# Patient Record
Sex: Male | Born: 1955 | Race: White | Hispanic: No | Marital: Married
Health system: Midwestern US, Community
[De-identification: ages and names within clinical notes are randomized; demographics above are authoritative.]

## PROBLEM LIST (undated history)

## (undated) DIAGNOSIS — M2540 Effusion, unspecified joint: Secondary | ICD-10-CM

## (undated) DIAGNOSIS — R519 Headache, unspecified: Secondary | ICD-10-CM

## (undated) DIAGNOSIS — L570 Actinic keratosis: Secondary | ICD-10-CM

## (undated) DIAGNOSIS — M1990 Unspecified osteoarthritis, unspecified site: Secondary | ICD-10-CM

## (undated) DIAGNOSIS — I809 Phlebitis and thrombophlebitis of unspecified site: Secondary | ICD-10-CM

## (undated) DIAGNOSIS — H3530 Unspecified macular degeneration: Secondary | ICD-10-CM

## (undated) DIAGNOSIS — R51 Headache: Secondary | ICD-10-CM

## (undated) DIAGNOSIS — M2550 Pain in unspecified joint: Secondary | ICD-10-CM

## (undated) DIAGNOSIS — I420 Dilated cardiomyopathy: Secondary | ICD-10-CM

## (undated) DIAGNOSIS — I4891 Unspecified atrial fibrillation: Secondary | ICD-10-CM

## (undated) DIAGNOSIS — I2584 Coronary atherosclerosis due to calcified coronary lesion: Secondary | ICD-10-CM

## (undated) DIAGNOSIS — I481 Persistent atrial fibrillation: Secondary | ICD-10-CM

## (undated) DIAGNOSIS — I2510 Atherosclerotic heart disease of native coronary artery without angina pectoris: Secondary | ICD-10-CM

## (undated) DIAGNOSIS — I4819 Other persistent atrial fibrillation: Secondary | ICD-10-CM

## (undated) DIAGNOSIS — I513 Intracardiac thrombosis, not elsewhere classified: Secondary | ICD-10-CM

## (undated) DIAGNOSIS — I5032 Chronic diastolic (congestive) heart failure: Principal | ICD-10-CM

## (undated) DIAGNOSIS — Z95810 Presence of automatic (implantable) cardiac defibrillator: Secondary | ICD-10-CM

## (undated) DIAGNOSIS — I5020 Unspecified systolic (congestive) heart failure: Secondary | ICD-10-CM

## (undated) HISTORY — DX: Actinic keratosis: L57.0

## (undated) HISTORY — PX: KNEE ARTHROSCOPY: SUR90

---

## 1998-04-17 HISTORY — PX: HERNIA REPAIR: SHX51

## 2001-04-17 HISTORY — PX: VARICOSE VEIN SURGERY: SHX832

## 2003-04-18 HISTORY — PX: JOINT REPLACEMENT: SHX530

## 2008-06-23 DIAGNOSIS — D239 Other benign neoplasm of skin, unspecified: Secondary | ICD-10-CM

## 2008-06-23 HISTORY — DX: Other benign neoplasm of skin, unspecified: D23.9

## 2008-07-06 ENCOUNTER — Inpatient Hospital Stay (HOSPITAL_COMMUNITY): Admission: RE | Admit: 2008-07-06 | Discharge: 2008-07-08 | Payer: Self-pay | Admitting: Orthopedic Surgery

## 2008-07-27 ENCOUNTER — Encounter: Payer: Self-pay | Admitting: Orthopedic Surgery

## 2008-08-15 ENCOUNTER — Encounter: Payer: Self-pay | Admitting: Orthopedic Surgery

## 2008-09-15 ENCOUNTER — Encounter: Payer: Self-pay | Admitting: Orthopedic Surgery

## 2009-12-06 DIAGNOSIS — I2109 ST elevation (STEMI) myocardial infarction involving other coronary artery of anterior wall: Secondary | ICD-10-CM

## 2009-12-06 NOTE — Procedures (Signed)
Procedures signed by  at 12/07/09  0200                 Author: Servando Snare  Service: --  Author Type: Physician       Filed: 12/07/09 0200  Date of Service: 12/06/09 2356  Status: Signed          Editor: Servando Snare            Procedure Orders        1. TRANSCRIBED CATH LAB DOCUMENTS [[id number]] ordered by  at 12/06/09 2356                         <!--EPICS-->                           [facility]<BR>                           [address]<BR>                          [place] [address]<BR>                               [phone]<BR> <BR>            [facility] - CATH LAB REPORT<BR> <BR> NAME:  [name] [name], [name] [name]                                MR:  [id number]<UU> LOC:  CC  [id number]             SEX:  M                ACCT:  [id number] DOB:  Feb 13, 1956            AGE:  54              PT:  I<BR> ADMIT:  12/06/2009          DSCH:                 MSV:  MED<BR> <BR> <BR> HISTORY:  The patient is a 54 year old gentleman who began having severe<BR> chest pain approximately  60 to 90 minutes prior to arrival in the<BR> emergency department, where his electrocardiograms demonstrated changes<BR> consistent with an acute anteroseptal myocardial infarction. He was<BR> brought to the cardiac catheterization laboratory emergently  for cardiac<BR> catheterization and PCI if possible.<BR> <BR> PROCEDURE:  Following informed consent, a left heart catheterization and<BR> coronary angiography were performed from the right groin without<BR> difficulty. A 6-French JR4 Judkins catheter  was used along with a<BR> 7-French 3.5 XB guiding catheter with fair engagement of the left main<BR> coronary artery ostium. With some difficulty, the LAD was wired and then<BR> pre-dilated using a 2.0 mm balloon. Manual aspiration was performed using<BR>  a Fetch catheter, with aspiration of a sizable amount of thrombus. A<BR> guide wire was also placed into the diagonal branch of the LAD just  prior<BR> to the totally occluded LAD. The LAD was then stented using a 3.0 x 28 mm<BR> Xience V drug-eluting  stent, which jailed the diagonal branch of the LAD.<BR> The proximal portion of the LAD just proximal to the bifurcation was<BR>  aneurysmal and ectatic and by intravascular ultrasound appeared to be 7<BR> mm in diameter or greater. The distal portion  of the stent was post<BR> dilated with a 3.25 mm balloon, and the most proximal segment was post<BR> dilated with a 4.0 x 8 mm noncompliant balloon at high pressures. He had<BR> moderate chest pain until the stents were deployed, and then his pain<BR>  subsided. Multiple doses of intracoronary nitroglycerin were utilized<BR> along with a bolus of intracoronary ReoPro directly into the left main<BR> coronary artery ostium. An intravascular ultrasound was performed,<BR> assessing the adequacy of the stent.  There was approximately 1-2 mm of<BR> the proximal stent edge which was in the aneurysmal segment of the<BR> proximal LAD, but it was felt that there was such a drastic change in<BR> diameter that it was best to leave this rather than to continue with<BR>  larger balloon sizes and risk perforation of the LAD. The procedure was<BR> then terminated. Intravenous Angiomax was used as a bolus and continuous<BR> infusion.<BR> <BR> X-RAY CONTRAST:  Optiray 233 ml.<BR> <BR> PRESSURE DATA:  Aorta 160/100, left ventricle  160/33.<BR> <BR> LEFT VENTRICULOGRAM:  The left ventricle appeared to be grossly normal in<BR> size with no mitral regurgitation noted. There was moderate enlargement<BR> of the ascending aorta. The anterior wall seemed to contract normally.<BR> There  was a small amount of akinesis in the inferoapical segment.<BR> <BR> CORONARY ANGIOGRAPHY<BR> 1. 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 Left anterior descending artery:  The LAD was totally occluded<BR> proximally after  a moderate sized first septal perforator, and after a<BR> moderate-sized diagonal branch which bifurcated in its proximal to mid<BR> segment. The diagonal had diffuse irregularity approaching 30% to 40%<BR>  narrowing of the diameter, but no high-grade focal stenosis was detected.<BR> The LAD was totally occluded after the bifurcation of the diagonal and<BR> 1st septal perforator, with very faint filling of the most apical portion<BR> of the LAD by collateral  circulation late in the injection phase.<BR> 3. Circumflex artery: The circumflex was severely diseased. There was a<BR> 70% narrowing of the diameter in the mid segment before the origin of a<BR> small to moderate size obtuse marginal branch. There was  a 70 to 80%<BR> narrowing of the diameter at the ostium of the obtuse marginal branch.<BR> The true circumflex just after the bifurcation of the marginal was<BR> totally occluded. The distal vessel was seen to fill late in the<BR> injection phase by collateral  circulation and was felt to be small and<BR> with a short distribution.<BR> 4. Right coronary artery: The right coronary artery was a large ectatic<BR> vessel with diffuse disease throughout its proximal mid and distal<BR> segment. There was a 70% narrowing  of the diameter noted in the distal<BR> vessel just before the origin of a small to moderate size posterior<BR> descending coronary artery. The posterior descending was diffusely<BR> irregular and diseased but was somewhat small. There was a large<BR>  posterolateral left ventricular branch which had a 70% narrowing of the<BR> diameter in its proximal segment. The distal portion of the<BR> posterolateral branch appeared free of significant stenosis. A right<BR> predominant circulation was noted.<BR>  5. Post PCI, the totally occluded proximal LAD was reduced to 0% residual<BR> narrowing following PCI and stent deployment. The distal vessel appeared<BR> irregular, but no focal high-grade stenosis was  detected. The jailed<BR> diagonal branch was widely  patent with TIMI grade 3 flow and no apparent<BR> angiographic compromise of the ostium of the diagonal. Intravascular<BR> ultrasound revealed a good transition  from the mid LAD into the distal<BR> portion of the stent. The minimum lumen diameter was  3.2 x 3.4 mm in the<BR> stented segment with a cross-sectional area of 8.9 sq mm. There was 1 to<BR> 2 mm of the proximal stent edge which appeared to be in the ectatic<BR> portion of the LAD and appeared to be unopposed; however, the vessel in<BR> this  region appeared to be greater than 7 mm in diameter, and it was felt<BR> to be impractical to try to oppose the stent to the area of ectasia.<BR> There was TIMI grade 0 flow in the mid and distal LAD prior to the<BR> procedure, with TIMI grade 3 flow  following PCI and stent deployment in<BR> the proximal LAD.<BR> <BR> CONCLUSION<BR> 1. Acute anteroseptal myocardial infarction with acute occlusion of the<BR> proximal LAD. Successful PCI and stent deployment using a 3.0 x 28 mm<BR> Xience V drug-eluting  stent.<BR> 2. Severe 2-vessel disease with a totally occluded mid circumflex<BR> coronary artery.<BR> 3. An occluded acute marginal branch of the right coronary artery filling<BR> by collateral circulation from the left coronary system.<BR> 4. Severe  disease in the distal right coronary artery.<BR> 5. Mild left ventricular systolic dysfunction.<BR> <BR> COMMENTS:  Medical therapy will continue for now. PCI of the distal right<BR> coronary artery and circumflex marginal to be considered for significant<BR>  angina pectoris or perhaps a coronary artery bypass graft surgery if his<BR> proximal LAD restenoses. Indefinite Effient or Plavix is recommended<BR> because of his small segment of unopposed stent and aneurysmal proximal<BR> LAD.<BR> <BR> <BR> <BR> <BR>  <BR> <BR> <BR> Servando Snare, MD<BR> <BR>             This is an unverified document unless signed by  physician.<BR> <BR> TID:  [id number]                                      DT:  12/06/2009 11:56 P<BR> JOB:  [id number]        DOC#:  [id number]           DD:   12/06/2009<BR> <BR> cc:   Servando Snare, MD<BR> <!--EPICE-->

## 2009-12-06 NOTE — ED Notes (Signed)
HPI & EKG d/w Dr. Hyman Bower who accepts pt in transfer to [facility].

## 2009-12-06 NOTE — H&P (Signed)
[facility]   [address]   [place], [place] [address]   [phone]     HISTORY AND PHYSICAL    NAME: [name] [name], [name] [name] MR: [id number]  LOC: 03 [id number] SEX: M ACCT: [id number]  DOB: 01/13/1956 AGE: 54 PT: I  ADMIT: 12/06/2009 DSCH: 12/09/2009 MSV: MED      ADMISSION DATE: 12/06/2009    ADMISSION DIAGNOSIS: Acute anterior myocardial infarction.    HISTORY: This 54 year old male presented to the [facility]  Emergency Room with anterior chest tightness that began while he was  swimming. It has been persistent, severe. Some sweating with it. Similar  pain the previous day that resolved after a short period of time. This  has been unresolved. He has no prior coronary history other than  hypertension. Takes atenolol 50 mg daily. Thinks his cholesterol has been  a little high, but he has not been on any medications for it. He has no  diabetes. He is a nonsmoker.    PAST MEDICAL HISTORY: Totally negative. This is his 1st  hospitalization.    REVIEW OF SYSTEMS: He has had no recent illnesses or major change in  weight or appetite. No eyes, ears, nose, or throat complaints. No chronic  pulmonary conditions. No peptic ulcer disease or bleeding, abdominal  pain. No urologic complaints. No orthopedic issues. No history of strokes  or TIA.    SOCIAL HISTORY: He is married and lives with his wife. He is a Optician, dispensing.  He is a nonsmoker.    FAMILY HISTORY: Remarkable for his father having coronary artery  disease.    MEDICATIONS  1. His only medication is atenolol.  2. He did take aspirin prior to coming to the ER.    ALLERGIES: HE HAS NO ALLERGIES.    PHYSICAL EXAMINATION  GENERAL: Reveals a large-stature white male, he is 6 feet 4 inches and  300 pounds.  VITAL SIGNS: Initial blood pressure was 200/100, with a heart rate of  64.  HEENT: Unremarkable.  NECK: Obese. No thyromegaly.  CHEST: Clear.  CARDIOVASCULAR: A regular rhythm. I could not hear a gallop.  ABDOMEN: Obese, nontender.  EXTREMITIES: Reveal no edema.   SKIN: He feels well perfused. Slightly moist.  NEUROLOGIC: No gross neurologic findings.    DATA: EKG shows a sinus rhythm, with an early change of an acute  anteroseptal myocardial infarction.    CBC and basic metabolic panel within normal limits.    IMPRESSION: Acute anteroseptal myocardial infarction.    COMMENTS: The patient is taken directly to the catheterization  laboratory for cardiac catheterization, coronary intervention. Procedure,  indication, risks were discussed. He has received some intravenous  heparin in the ER, along with aspirin. Other medications will be  depending on his findings and the lab.                Georganna Skeans, MD     This is an unverified document unless signed by physician.    TID: [id number] DIC ID: [id number] DT: 12/06/2009 10:00 P  JOB: [id number] DOC#: [id number] DD: 12/06/2009     cc: Georganna Skeans, MD

## 2009-12-06 NOTE — Procedures (Signed)
[facility]   [address]   [place], [place]. [address]   [phone]     [facility] - CATH LAB REPORT    NAME: Gaje, Tennyson MR: [id number]  LOC: CC 33021 SEX: M ACCT: [id number]  DOB: [birth year] AGE: 54 PT: I  ADMIT: 12/06/2009 DSCH: MSV: MED      HISTORY: The patient is a 54 year old gentleman who began having severe  chest pain approximately 60 to 90 minutes prior to arrival in the  emergency department, where his electrocardiograms demonstrated changes  consistent with an acute anteroseptal myocardial infarction. He was  brought to the cardiac catheterization laboratory emergently for cardiac  catheterization and PCI if possible.    PROCEDURE: Following informed consent, a left heart catheterization and  coronary angiography were performed from the right groin without  difficulty. A 6-French JR4 Judkins catheter was used along with a  7-French 3.5 XB guiding catheter with fair engagement of the left main  coronary artery ostium. With some difficulty, the LAD was wired and then  pre-dilated using a 2.0 mm balloon. Manual aspiration was performed using  a Fetch catheter, with aspiration of a sizable amount of thrombus. A  guide wire was also placed into the diagonal branch of the LAD just prior  to the totally occluded LAD. The LAD was then stented using a 3.0 x 28 mm  Xience V drug-eluting stent, which jailed the diagonal branch of the LAD.  The proximal portion of the LAD just proximal to the bifurcation was  aneurysmal and ectatic and by intravascular ultrasound appeared to be 7  mm in diameter or greater. The distal portion of the stent was post  dilated with a 3.25 mm balloon, and the most proximal segment was post  dilated with a 4.0 x 8 mm noncompliant balloon at high pressures. He had  moderate chest pain until the stents were deployed, and then his pain  subsided. Multiple doses of intracoronary nitroglycerin were utilized   along with a bolus of intracoronary ReoPro directly into the left main  coronary artery ostium. An intravascular ultrasound was performed,  assessing the adequacy of the stent. There was approximately 1-2 mm of  the proximal stent edge which was in the aneurysmal segment of the  proximal LAD, but it was felt that there was such a drastic change in  diameter that it was best to leave this rather than to continue with  larger balloon sizes and risk perforation of the LAD. The procedure was  then terminated. Intravenous Angiomax was used as a bolus and continuous  infusion.    X-RAY CONTRAST: Optiray 233 ml.    PRESSURE DATA: Aorta 160/100, left ventricle 160/33.    LEFT VENTRICULOGRAM: The left ventricle appeared to be grossly normal in  size with no mitral regurgitation noted. There was moderate enlargement  of the ascending aorta. The anterior wall seemed to contract normally.  There was a small amount of akinesis in the inferoapical segment.    CORONARY ANGIOGRAPHY  1. Left main coronary artery: This vessel was extremely large and  aneurysmal with no significant stenosis detected.  2. Left anterior descending artery: The LAD was totally occluded  proximally after a moderate sized first septal perforator, and after a  moderate-sized diagonal branch which bifurcated in its proximal to mid  segment. The diagonal had diffuse irregularity approaching 30% to 40%  narrowing of the diameter, but no high-grade focal stenosis was detected.  The LAD was totally  occluded after the bifurcation of the diagonal and  1st septal perforator, with very faint filling of the most apical portion  of the LAD by collateral circulation late in the injection phase.  3. Circumflex artery: The circumflex was severely diseased. There was a  70% narrowing of the diameter in the mid segment before the origin of a  small to moderate size obtuse marginal branch. There was a 70 to 80%   narrowing of the diameter at the ostium of the obtuse marginal branch.  The true circumflex just after the bifurcation of the marginal was  totally occluded. The distal vessel was seen to fill late in the  injection phase by collateral circulation and was felt to be small and  with a short distribution.  4. Right coronary artery: The right coronary artery was a large ectatic  vessel with diffuse disease throughout its proximal mid and distal  segment. There was a 70% narrowing of the diameter noted in the distal  vessel just before the origin of a small to moderate size posterior  descending coronary artery. The posterior descending was diffusely  irregular and diseased but was somewhat small. There was a large  posterolateral left ventricular branch which had a 70% narrowing of the  diameter in its proximal segment. The distal portion of the  posterolateral branch appeared free of significant stenosis. A right  predominant circulation was noted.  5. Post PCI, the totally occluded proximal LAD was reduced to 0% residual  narrowing following PCI and stent deployment. The distal vessel appeared  irregular, but no focal high-grade stenosis was detected. The jailed  diagonal branch was widely patent with TIMI grade 3 flow and no apparent  angiographic compromise of the ostium of the diagonal. Intravascular  ultrasound revealed a good transition from the mid LAD into the distal  portion of the stent. The minimum lumen diameter was 3.2 x 3.4 mm in the  stented segment with a cross-sectional area of 8.9 sq mm. There was 1 to  2 mm of the proximal stent edge which appeared to be in the ectatic  portion of the LAD and appeared to be unopposed; however, the vessel in  this region appeared to be greater than 7 mm in diameter, and it was felt  to be impractical to try to oppose the stent to the area of ectasia.  There was TIMI grade 0 flow in the mid and distal LAD prior to the   procedure, with TIMI grade 3 flow following PCI and stent deployment in  the proximal LAD.    CONCLUSION  1. Acute anteroseptal myocardial infarction with acute occlusion of the  proximal LAD. Successful PCI and stent deployment using a 3.0 x 28 mm  Xience V drug-eluting stent.  2. Severe 2-vessel disease with a totally occluded mid circumflex  coronary artery.  3. An occluded acute marginal branch of the right coronary artery filling  by collateral circulation from the left coronary system.  4. Severe disease in the distal right coronary artery.  5. Mild left ventricular systolic dysfunction.    COMMENTS: Medical therapy will continue for now. PCI of the distal right  coronary artery and circumflex marginal to be considered for significant  angina pectoris or perhaps a coronary artery bypass graft surgery if his  proximal LAD restenoses. Indefinite Effient or Plavix is recommended  because of his small segment of unopposed stent and aneurysmal proximal  LAD.  Servando Snare, MD     This is an unverified document unless signed by physician.    TID: [id number] DT: 12/06/2009 11:56 P  JOB: [id number] DOC#: [id number] DD: 12/06/2009    cc: Servando Snare, MD

## 2009-12-06 NOTE — ED Notes (Signed)
Too 650 mg asa prior to arrival

## 2009-12-06 NOTE — Progress Notes (Incomplete)
TRANSFER - OUT REPORT:    Verbal report given to RN on [name] [name]  being transferred to [facility], [facility] for routine progression of care       Report consisted of patient???s Situation, Background, Assessment and   Recommendations(SBAR).     Information from the following report(s) SBAR, Kardex, Procedure Summary, MAR and Recent Results was reviewed with the receiving nurse.    Opportunity for questions and clarification was provided.      Left heart cath/PCI completed by Dr. Suzie Portela, patient transferred in from [facility] as STEMI  58fr long sheath in right FA  VSS, procedure tolerated well  Morphine mg given IV  Angiomax IV bolus followed continuous IV gtt  Reopro IC bolus 10 ml given by Dr. Suzie Portela  Reglan 10 mg given IV  Lopressor 5 mg given IV  Nitroglycerin IV gtt at 15 mcg/min  Aspirin 650 mg taken PO at home pre procedure

## 2009-12-06 NOTE — Progress Notes (Signed)
Patient received from cath lab-sheath to right groin with slight oozing; no hematoma noted; denies pain; pedal pulses palpable; feet cool with normal sensation; SR on monitor; NTG infusing at 73mcg/min; increased to 30 mcg/min for better BP control; lungs CTA; Sats 98% on 2L NC; no c/o SOB or CP; family brought to bedside and updated; continue plan of care

## 2009-12-06 NOTE — Progress Notes (Signed)
TRANSFER - IN REPORT:    Verbal report received from Gunnard, RN(name) on Emon Lance  being received from Cath Lab(unit) for routine progression of care      Report consisted of patient???s Situation, Background, Assessment and   Recommendations(SBAR).     Information from the following report(s) SBAR, Procedure Summary, Intake/Output and Recent Results was reviewed with the receiving nurse.    Opportunity for questions and clarification was provided.      Assessment completed upon patient???s arrival to unit and care assumed.

## 2009-12-06 NOTE — ED Notes (Signed)
No answer at [facility] lab for report, will attempt again.

## 2009-12-06 NOTE — Progress Notes (Signed)
TRANSFER - OUT REPORT:    Verbal report given to Tresa Endo, RN on Haaris Metallo  being transferred to 3302 for routine progression of care       Report consisted of patient???s Situation, Background, Assessment and   Recommendations(SBAR).     Information from the following report(s) SBAR, Kardex, Procedure Summary, MAR and Recent Results was reviewed with the receiving nurse.    Opportunity for questions and clarification was provided.      Left heart cath/PCI completed by Dr. Suzie Portela, patient transferred in from [facility] as STEMI   Drug eluding stent to prox LAD  79fr long sheath in right FA, sutured and attached to art line  VSS, procedure tolerated well   Morphine 9 mg given IV   Angiomax IV bolus followed continuous IV gtt , dc'd at 2200  Reopro IC bolus 10 ml given by Dr. Suzie Portela   Reglan 10 mg given IV   Lopressor 5 mg given IV   Nitroglycerin IV gtt at 15 mcg/min   Aspirin 650 mg taken PO at home pre procedure  Effient 60 mg po given post procedure

## 2009-12-06 NOTE — Progress Notes (Signed)
Spiritual Care visit. Follow up visit.     ?? Met patient's family at the ED, accompanied them to the Cath Lab, and informed Cath Lab that they were present.  ?? Spoke with family about what they could expect, and where patient is likely to be upon leaving the cath lab.  ?? After patient was taken past cath lab to CCU, Accompanied family to CCU waiting room.  ?? Gave them a business card so they can contact a chaplain as needed.    Spiritual Care Assessment/Progress Notes    [name] [name] [id number]  [id number]    1955-05-31  54 y.o.  male    Patient Telephone Number: [phone] (home)   Religious Affiliation: [employer]ri[employer] Knuckles   Language: English   No emergency contact information on file.   Patient Active Problem List   Diagnoses Date Noted   ??? Anterior myocardial infarction [410.10E] 12/06/2009   ??? Essential hypertension, benign [401.1] 12/06/2009   ??? Coronary atherosclerosis of native coronary artery [414.01] 12/06/2009   ??? Dyslipidemia [272.4CR] 12/06/2009          Date: 12/06/2009       Level of Religious/Spiritual Activity:  [x]            Involved in faith tradition/spiritual practice    []            Not involved in faith tradition/spiritual practice  []            Spiritually oriented    []            Claims no spiritual orientation    []            seeking spiritual identity  []            Feels alienated from religious practice/tradition  []            Feels angry about religious practice/tradition  [x]            Spirituality/religious tradition IS a resource for coping at this time.  []            Not able to assess due to medical condition    Services Provided Today:  []            crisis intervention    []            reading Scriptures  [x]            spiritual assessment    [x]            prayer  []            empathic listening/emotional support  []            rites and rituals (cite in comments)  []            life review     []            religious support  []            theological development   []            advocacy   []            ethical dialog     []            blessing  []            bereavement support    [x]            support to family  []            anticipatory grief support   []   help with AMD  []            spiritual guidance    []            meditation      Spiritual Care Needs  []            Emotional Support  []            Spiritual/Religious Care  []            Loss/Adjustment  []            Advocacy/Referral /Ethics  [x]            No needs expressed at this time  []            Other: (note in comments)  Spiritual Care Plan  []            Follow up visits with pt/family  []            Provide materials  []            Schedule sacraments  []            Contact Community Clergy  [x]            Follow up as needed  []            Other: (note in comments)     Comments: Spiritual Assessment     Advance Directives  None on file.  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    [name] [name]     Visit by Arelia Sneddon, M.Ed., Th.B. ,Staff Chaplain

## 2009-12-06 NOTE — H&P (Deleted)
[facility]   [address]   [place], [place] [address]   [phone]     HISTORY AND PHYSICAL    NAME: [name] [name], [name] [name] MR: [id number]  LOC: WER SEX: Gillermina Hu: [id number]  DOB: April 16, 1956 AGE: 54 PT: E  ADMIT: 12/06/2009 DSCH: MSV: EMR      ADMISSION DATE: 12/06/2009    ADMISSION DIAGNOSIS: Acute anterior myocardial infarction.    HISTORY: This 54 year old male presented to the [facility]  Emergency Room with anterior chest tightness that began while he was  swimming. It has been persistent, severe. Some sweating with it. Similar  pain the previous day that resolved after a short period of time. This  has been unresolved. He has no prior coronary history other than  hypertension. Takes atenolol 50 mg daily. Thinks his cholesterol has been  a little high, but he has not been on any medications for it. He has no  diabetes. He is a nonsmoker.    PAST MEDICAL HISTORY: Totally negative. This is his 1st  hospitalization.    REVIEW OF SYSTEMS: He has had no recent illnesses or major change in  weight or appetite. No eyes, ears, nose, or throat complaints. No chronic  pulmonary conditions. No peptic ulcer disease or bleeding, abdominal  pain. No urologic complaints. No orthopedic issues. No history of strokes  or TIA.    SOCIAL HISTORY: He is married and lives with his wife. He is a Optician, dispensing.  He is a nonsmoker.    FAMILY HISTORY: Remarkable for his father having coronary artery  disease.    MEDICATIONS  1. His only medication is atenolol.  2. He did take aspirin prior to coming to the ER.    ALLERGIES: HE HAS NO ALLERGIES.    PHYSICAL EXAMINATION  GENERAL: Reveals a large-stature white male, he is 6 feet 4 inches and  300 pounds.  VITAL SIGNS: Initial blood pressure was 200/100, with a heart rate of  64.  HEENT: Unremarkable.  NECK: Obese. No thyromegaly.  CHEST: Clear.  CARDIOVASCULAR: A regular rhythm. I could not hear a gallop.  ABDOMEN: Obese, nontender.  EXTREMITIES: Reveal no edema.   SKIN: He feels well perfused. Slightly moist.  NEUROLOGIC: No gross neurologic findings.    DATA: EKG shows a sinus rhythm, with an early change of an acute  anteroseptal myocardial infarction.    CBC and basic metabolic panel within normal limits.    IMPRESSION: Acute anteroseptal myocardial infarction.    COMMENTS: The patient is taken directly to the catheterization  laboratory for cardiac catheterization, coronary intervention. Procedure,  indication, risks were discussed. He has received some intravenous  heparin in the ER, along with aspirin. Other medications will be  depending on his findings and the lab.                Georganna Skeans, MD     This is an unverified document unless signed by physician.    TID: [id number] DIC ID: [id number] DT: 12/06/2009 10:00 P  JOB: [id number] DOC#: [id number] DD: 12/06/2009     cc: Georganna Skeans, MD

## 2009-12-06 NOTE — ED Notes (Signed)
Pt via [facility] to [facility].

## 2009-12-06 NOTE — Progress Notes (Signed)
Patient received into lab for left heart cath, transfer from [facility], as a STEMI.  Patient was identified using name and date of birth.  Pertinent information was reviewed including allergies, history, medications and lab work.  Patient states that he has no questions concerning procedure.  Signed consent is on chart as well current history and physical.  IV access was checked for patency.

## 2009-12-06 NOTE — Progress Notes (Signed)
Spiritual Care visit attempted; Patient arrived from ES to Cath Lab DT. No family present yet.    Chaplain asked person doing registration in ED to call when family arrived.    Visit by Arelia Sneddon, M.Ed., Th.B. ,Staff Chaplain

## 2009-12-06 NOTE — ED Notes (Signed)
No answer for report at  [facility] or ED x 4.

## 2009-12-06 NOTE — ED Provider Notes (Signed)
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   Took 650mg  @ home PTA.   No sob, dizzy, diaphoresis, cough, congestion.        Risk factors = morbid obesity, HTN.    Patient is a 54 y.o. male presenting with chest pain. The history is provided by the patient.   Chest Pain (Angina)   Associated symptoms include diaphoresis and nausea. Pertinent negatives include no fever, no palpitations, no abdominal pain, no vomiting, no headaches and no shortness of breath.        Past Medical History   Diagnosis Date   ??? Hypertension           No past surgical history on file.      No family history on file.     History   Social History   ??? Marital Status: Married     Spouse Name: N/A     Number of Children: N/A   ??? Years of Education: N/A   Occupational History   ??? Not on file.   Social History Main Topics   ??? Smoking status: Never Smoker    ??? Smokeless tobacco: Not on file   ??? Alcohol Use: No   ??? Drug Use:    ??? Sexually Active:    Other Topics Concern   ??? Not on file   Social History Narrative   ??? No narrative on file                    ALLERGIES: Review of patient's allergies indicates no known allergies.      Review of Systems   Constitutional: Positive for diaphoresis. Negative for fever, chills, activity change, appetite change and fatigue.   HENT: Negative for congestion, neck pain and neck stiffness.    Respiratory: Positive for chest tightness. Negative for shortness of breath.    Cardiovascular: Positive for chest pain. Negative for palpitations and leg swelling.   Gastrointestinal: Positive for nausea. Negative for vomiting, abdominal pain and diarrhea.   Neurological: Negative for light-headedness and headaches.   Psychiatric/Behavioral: Negative for confusion, dysphoric mood and decreased concentration.   All other systems reviewed and are negative.        Filed Vitals:    12/06/09 2021   Temp: 98.9 ??F (37.2 ??C)              Physical Exam   Vitals reviewed.   Constitutional: He is oriented to person, place, and time. He appears well-developed. No distress.   HENT:   Head: Normocephalic.   Mouth/Throat: Oropharynx is clear and moist.   Eyes: Pupils are equal, round, and reactive to light.   Neck: Neck supple. No JVD present.   Cardiovascular: Normal rate, regular rhythm and normal heart sounds.    Pulmonary/Chest: Effort normal and breath sounds normal.   Abdominal: Soft. Bowel sounds are normal. No tenderness.   Musculoskeletal: He exhibits no edema.   Neurological: He is alert and oriented to person, place, and time.   Skin: Skin is warm.   Psychiatric: He has a normal mood and affect.        MDM     Amount and/or Complexity of Data Reviewed:   Clinical lab tests:  Ordered and reviewed  Tests in the radiology section of CPT??:  Ordered   Discuss the patient with another provider:  Yes   Independant visualization of image, tracing, or specimen:  Yes  Risk of Significant Complications, Morbidity, and/or Mortality:  Presenting problems:  High  Diagnostic procedures:  Moderate  Management options:  Moderate  Critical Care:   Total time providing critical care:  30-74 minutes  Progress:   Patient progress:  Stable      Procedures    Room Air Oxygen saturation normal, no intervention necessary    Results In Last 24 hours:    Recent Results (from the past 24 hour(s))   CBC W/O DIFF    Collection Time    12/06/09  8:20 PM   Component Value Range   ??? WBC 10.4  4.3 - 11.1 (K/uL)   ??? RBC 5.18  4.23 - 5.67 (M/uL)   ??? HGB 14.7  13.2 - 17.1 (g/dL)   ??? HCT 42.9  41.1 - 50.3 (%)   ??? MCV 82.8  79.6 - 97.8 (FL)   ??? MCH 28.4  26.1 - 32.9 (PG)   ??? MCHC 34.3  31.4 - 35.0 (g/dL)   ??? RDW 14.7 (*) 11.9 - 14.6 (%)   ??? PLATELET 190  150 - 450 (K/uL)   ??? MPV 10.9  10.8 - 14.1 (FL)

## 2009-12-07 ENCOUNTER — Inpatient Hospital Stay
Admit: 2009-12-07 | Discharge: 2009-12-09 | Disposition: A | Discharge status: Home / Self Care | Source: Ambulatory Visit | Attending: Cardiovascular Disease | Admitting: Cardiovascular Disease

## 2009-12-07 LAB — CBC WITH AUTOMATED DIFF
ABS. BASOPHILS: 0 10*3/uL (ref 0.0–0.2)
ABS. EOSINOPHILS: 0.1 10*3/uL (ref 0.0–0.8)
ABS. IMM. GRANS.: 0.1 10*3/uL (ref 0.0–2.0)
ABS. LYMPHOCYTES: 0.9 10*3/uL (ref 0.5–4.6)
ABS. MONOCYTES: 0.5 10*3/uL (ref 0.1–1.3)
ABS. NEUTROPHILS: 8.9 10*3/uL — ABNORMAL HIGH (ref 1.7–8.2)
BASOPHILS: 0 % (ref 0.0–2.0)
EOSINOPHILS: 1 % (ref 0.5–7.8)
HCT: 41.5 % (ref 41.1–50.3)
HGB: 14.1 g/dL (ref 13.2–17.1)
IMMATURE GRANULOCYTES: 0.5 % (ref 0.0–2.0)
LYMPHOCYTES: 8 % — ABNORMAL LOW (ref 13–44)
MCH: 28.3 PG (ref 26.1–32.9)
MCHC: 34 g/dL (ref 31.4–35.0)
MCV: 83.2 FL (ref 79.6–97.8)
MONOCYTES: 5 % (ref 4.0–12.0)
MPV: 11.2 FL (ref 10.8–14.1)
NEUTROPHILS: 86 % — ABNORMAL HIGH (ref 43–78)
PLATELET: 189 10*3/uL (ref 150–450)
RBC: 4.99 M/uL (ref 4.23–5.67)
RDW: 14.5 % (ref 11.9–14.6)
WBC: 10.4 10*3/uL (ref 4.3–11.1)

## 2009-12-07 LAB — EKG, 12 LEAD, INITIAL
Atrial Rate: 64 {beats}/min
Atrial Rate: 76 {beats}/min
Calculated P Axis: 41 degrees
Calculated P Axis: 46 degrees
Calculated R Axis: -37 degrees
Calculated R Axis: 15 degrees
Calculated T Axis: 68 degrees
Calculated T Axis: 71 degrees
Diagnosis: NORMAL
Diagnosis: NORMAL
P-R Interval: 158 ms
P-R Interval: 182 ms
Q-T Interval: 398 ms
Q-T Interval: 400 ms
QRS Duration: 108 ms
QRS Duration: 94 ms
QTC Calculation (Bezet): 410 ms
QTC Calculation (Bezet): 450 ms
Ventricular Rate: 64 {beats}/min
Ventricular Rate: 76 {beats}/min

## 2009-12-07 LAB — METABOLIC PANEL, BASIC
Anion gap: 10 mmol/L (ref 7–16)
BUN: 16 MG/DL (ref 6–23)
CO2: 26 MMOL/L (ref 23–32)
Calcium: 8.8 MG/DL (ref 8.3–10.4)
Chloride: 103 MMOL/L (ref 98–107)
Creatinine: 1 MG/DL (ref 0.8–1.5)
GFR est AA: >60 mL/min/{1.73_m2} (ref >60)
GFR est non-AA: >60 mL/min/{1.73_m2} (ref >60)
Glucose: 153 MG/DL — ABNORMAL HIGH (ref 65–100)
Potassium: 4.5 MMOL/L (ref 3.5–5.1)
Sodium: 139 MMOL/L (ref 136–145)

## 2009-12-07 LAB — METABOLIC PANEL, COMPREHENSIVE
A-G Ratio: 1.3 (ref 1.2–3.5)
ALT (SGPT): 111 U/L — ABNORMAL HIGH (ref 39–65)
AST (SGOT): 753 U/L — ABNORMAL HIGH (ref 15–37)
Albumin: 4 g/dL (ref 3.5–5.0)
Alk. phosphatase: 103 U/L (ref 50–136)
Anion gap: 9 mmol/L (ref 7–16)
BUN: 15 MG/DL (ref 6–23)
Bilirubin, total: 0.3 MG/DL (ref 0.2–1.1)
CO2: 23 MMOL/L (ref 23–32)
Calcium: 9.3 MG/DL (ref 8.3–10.4)
Chloride: 103 MMOL/L (ref 98–107)
Creatinine: 1 MG/DL (ref 0.8–1.5)
GFR est AA: >60 mL/min/{1.73_m2} (ref >60)
GFR est non-AA: >60 mL/min/{1.73_m2} (ref >60)
Globulin: 3.1 g/dL (ref 2.3–3.5)
Glucose: 145 MG/DL — ABNORMAL HIGH (ref 65–100)
Potassium: 4.3 MMOL/L (ref 3.5–5.1)
Protein, total: 7.1 g/dL (ref 6.3–8.2)
Sodium: 135 MMOL/L — ABNORMAL LOW (ref 136–145)

## 2009-12-07 LAB — POC CARDIAC MARKERS W BNP
BNP: 22 pg/mL (ref 0.0–100.0)
CK-MB: <1 ng/mL (ref 0.0–8.0)
Myoglobin: 47 ng/mL (ref 0–170)
Troponin-I: <0.05 ng/mL (ref 0.00–0.30)

## 2009-12-07 LAB — EKG, 12 LEAD, SUBSEQUENT
Atrial Rate: 74 {beats}/min
Calculated P Axis: 56 degrees
Calculated R Axis: 62 degrees
Calculated T Axis: 60 degrees
P-R Interval: 156 ms
Q-T Interval: 400 ms
QRS Duration: 114 ms
QTC Calculation (Bezet): 444 ms
Ventricular Rate: 74 {beats}/min

## 2009-12-07 LAB — CBC W/O DIFF
HCT: 42.9 % (ref 41.1–50.3)
HGB: 14.7 g/dL (ref 13.2–17.1)
MCH: 28.4 PG (ref 26.1–32.9)
MCHC: 34.3 g/dL (ref 31.4–35.0)
MCV: 82.8 FL (ref 79.6–97.8)
MPV: 10.9 FL (ref 10.8–14.1)
PLATELET: 190 10*3/uL (ref 150–450)
RBC: 5.18 M/uL (ref 4.23–5.67)
RDW: 14.7 % — ABNORMAL HIGH (ref 11.9–14.6)
WBC: 10.4 10*3/uL (ref 4.3–11.1)

## 2009-12-07 LAB — TROPONIN I: Troponin-I, Qt.: >50 NG/ML — CR (ref 0.04–0.05)

## 2009-12-07 LAB — MRSA SCREEN - PCR (NASAL)

## 2009-12-07 LAB — LIPID PANEL
CHOL/HDL Ratio: 9.6
Cholesterol, total: 230 MG/DL — ABNORMAL HIGH (ref <200)
HDL Cholesterol: 24 MG/DL — ABNORMAL LOW (ref 40–60)
LDL, calculated: ELEVATED MG/DL (ref <100)
Triglyceride: 474 MG/DL — ABNORMAL HIGH (ref 35–150)
VLDL, calculated: 94.8 MG/DL — ABNORMAL HIGH (ref 6.0–23.0)

## 2009-12-07 MED ORDER — SODIUM CHLORIDE 0.9 % IV
INTRAVENOUS | Status: AC
Start: 2009-12-07 — End: 2009-12-07
  Administered 2009-12-07: 03:00:00 via INTRAVENOUS

## 2009-12-07 MED ORDER — ABCIXIMAB 10 MG/5 ML IV SOLN
10 mg/5 mL | INTRAVENOUS | Status: AC
Start: 2009-12-07 — End: ?

## 2009-12-07 MED ORDER — NITROGLYCERIN 0.4 MG/DOSE TRANSLINGUAL SPRAY
400 mcg/spray | Status: DC | PRN
Start: 2009-12-07 — End: 2009-12-09

## 2009-12-07 MED ORDER — METOPROLOL TARTRATE 5 MG/5 ML IV SOLN
5 mg/ mL | Freq: Once | INTRAVENOUS | Status: AC
Start: 2009-12-07 — End: 2009-12-06
  Administered 2009-12-07: 02:00:00 via INTRAVENOUS

## 2009-12-07 MED ORDER — METOCLOPRAMIDE 5 MG/ML IJ SOLN
5 mg/mL | INTRAMUSCULAR | Status: DC | PRN
Start: 2009-12-07 — End: 2009-12-09

## 2009-12-07 MED ORDER — HEPARIN (PORCINE) IN NS (PF) 2,000 UNIT/1,000 ML IV
2000 unit/1,000 mL | INTRAVENOUS | Status: DC
Start: 2009-12-07 — End: 2009-12-06
  Administered 2009-12-07: 01:00:00 via INTRA_ARTERIAL

## 2009-12-07 MED ORDER — BIVALIRUDIN 250 MG SOLUTION
250 mg | INTRAVENOUS | Status: AC
Start: 2009-12-07 — End: ?

## 2009-12-07 MED ORDER — BIVALIRUDIN 250 MG SOLUTION
250 mg | INTRAVENOUS | Status: DC
Start: 2009-12-07 — End: 2009-12-06
  Administered 2009-12-07: 01:00:00 via INTRAVENOUS

## 2009-12-07 MED ORDER — MORPHINE 10 MG/ML INJ SOLUTION
10 mg/ml | INTRAMUSCULAR | Status: AC
Start: 2009-12-07 — End: ?

## 2009-12-07 MED ORDER — METOCLOPRAMIDE 5 MG/ML IJ SOLN
5 mg/mL | INTRAMUSCULAR | Status: DC | PRN
Start: 2009-12-07 — End: 2009-12-06
  Administered 2009-12-07: 01:00:00 via INTRAVENOUS

## 2009-12-07 MED ORDER — MORPHINE 4 MG/ML SYRINGE
4 mg/mL | INTRAMUSCULAR | Status: DC | PRN
Start: 2009-12-07 — End: 2009-12-08
  Administered 2009-12-07: 05:00:00 via INTRAVENOUS

## 2009-12-07 MED ORDER — ASPIRIN 81 MG CHEWABLE TAB
81 mg | Freq: Every day | ORAL | Status: DC
Start: 2009-12-07 — End: 2009-12-08
  Administered 2009-12-07: 12:00:00 via ORAL

## 2009-12-07 MED ORDER — FENTANYL CITRATE (PF) 50 MCG/ML IJ SOLN
50 mcg/mL | INTRAMUSCULAR | Status: DC | PRN
Start: 2009-12-07 — End: 2009-12-06

## 2009-12-07 MED ORDER — ACETAMINOPHEN 325 MG TABLET
325 mg | ORAL | Status: DC | PRN
Start: 2009-12-07 — End: 2009-12-09
  Administered 2009-12-07 – 2009-12-08 (×2): via ORAL

## 2009-12-07 MED ORDER — PRASUGREL 10 MG TAB
10 mg | ORAL | Status: AC
Start: 2009-12-07 — End: ?

## 2009-12-07 MED ORDER — SALINE PERIPHERAL FLUSH Q8H
Freq: Three times a day (TID) | INTRAMUSCULAR | Status: DC
Start: 2009-12-07 — End: 2009-12-09
  Administered 2009-12-07 – 2009-12-09 (×7)

## 2009-12-07 MED ORDER — NITROGLYCERIN IN D5W 200 MCG/ML IV
50 mg/2 mL (200 mcg/mL) | INTRAVENOUS | Status: DC
Start: 2009-12-07 — End: 2009-12-07

## 2009-12-07 MED ORDER — LISINOPRIL 5 MG TAB
5 mg | Freq: Every day | ORAL | Status: DC
Start: 2009-12-07 — End: 2009-12-07

## 2009-12-07 MED ORDER — MAGNESIUM HYDROXIDE 400 MG/5 ML ORAL SUSP
400 mg/5 mL | Freq: Every day | ORAL | Status: DC | PRN
Start: 2009-12-07 — End: 2009-12-09

## 2009-12-07 MED ORDER — SALINE PERIPHERAL FLUSH PRN
INTRAMUSCULAR | Status: DC | PRN
Start: 2009-12-07 — End: 2009-12-09

## 2009-12-07 MED ORDER — MIDAZOLAM 1 MG/ML IJ SOLN
1 mg/mL | INTRAMUSCULAR | Status: DC | PRN
Start: 2009-12-07 — End: 2009-12-06

## 2009-12-07 MED ORDER — HYDROCODONE-ACETAMINOPHEN 7.5 MG-325 MG TAB
ORAL | Status: DC | PRN
Start: 2009-12-07 — End: 2009-12-09
  Administered 2009-12-09: 01:00:00 via ORAL

## 2009-12-07 MED ORDER — METOPROLOL TARTRATE 50 MG TAB
50 mg | Freq: Four times a day (QID) | ORAL | Status: DC
Start: 2009-12-07 — End: 2009-12-07
  Administered 2009-12-07 (×3): via ORAL

## 2009-12-07 MED ORDER — BIVALIRUDIN (ANGIOMAX) 5 MG/ML BOLUS NC
5 mg/mL | Freq: Once | INTRAVENOUS | Status: AC
Start: 2009-12-07 — End: 2009-12-06
  Administered 2009-12-07: 01:00:00 via INTRAVENOUS

## 2009-12-07 MED ORDER — HEPARIN (PORCINE) IN D5W 25,000 UNIT/500 ML IV
25000 unit/500 mL (50 unit/mL) | INTRAVENOUS | Status: DC
Start: 2009-12-07 — End: 2009-12-06

## 2009-12-07 MED ORDER — HEPARIN (PORCINE) 5,000 UNIT/ML IJ SOLN
5000 unit/mL | INTRAMUSCULAR | Status: AC
Start: 2009-12-07 — End: 2009-12-06
  Administered 2009-12-07: 01:00:00 via INTRAVENOUS

## 2009-12-07 MED ORDER — NITROGLYCERIN 0.2MG/ML SYRINGE
0.2 mg/mL | Freq: Once | INTRAMUSCULAR | Status: AC
Start: 2009-12-07 — End: 2009-12-06
  Administered 2009-12-07: 01:00:00 via INTRACORONARY

## 2009-12-07 MED ORDER — PRASUGREL 10 MG TAB
10 mg | Freq: Once | ORAL | Status: AC
Start: 2009-12-07 — End: 2009-12-06
  Administered 2009-12-07: 02:00:00 via ORAL

## 2009-12-07 MED ORDER — ZOLPIDEM 10 MG TAB
10 mg | Freq: Every evening | ORAL | Status: DC | PRN
Start: 2009-12-07 — End: 2009-12-09
  Administered 2009-12-08: 01:00:00 via ORAL

## 2009-12-07 MED ORDER — ATORVASTATIN 40 MG TAB
40 mg | Freq: Every evening | ORAL | Status: DC
Start: 2009-12-07 — End: 2009-12-09
  Administered 2009-12-07 – 2009-12-09 (×4): via ORAL

## 2009-12-07 MED ORDER — METOPROLOL TARTRATE 5 MG/5 ML IV SOLN
5 mg/ mL | INTRAVENOUS | Status: AC
Start: 2009-12-07 — End: ?

## 2009-12-07 MED ORDER — METOCLOPRAMIDE 5 MG/ML IJ SOLN
5 mg/mL | INTRAMUSCULAR | Status: AC
Start: 2009-12-07 — End: ?

## 2009-12-07 MED ORDER — LIP PROTECTANT 0.6 %-0.5 %-1 %-0.5 % OINTMENT
CUTANEOUS | Status: DC | PRN
Start: 2009-12-07 — End: 2009-12-09
  Administered 2009-12-07: 04:00:00 via TOPICAL

## 2009-12-07 MED ORDER — SODIUM CHLORIDE 0.9 % IV PIGGY BACK
INTRAVENOUS | Status: AC
Start: 2009-12-07 — End: ?

## 2009-12-07 MED ORDER — ATENOLOL 50 MG TAB
50 mg | Freq: Every day | ORAL | Status: DC
Start: 2009-12-07 — End: 2009-12-08
  Administered 2009-12-07: 22:00:00 via ORAL

## 2009-12-07 MED ORDER — IOVERSOL 350 MG/ML IV SOLN
350 mg iodine/mL | Freq: Once | INTRAVENOUS | Status: DC
Start: 2009-12-07 — End: 2009-12-06

## 2009-12-07 MED ORDER — MORPHINE 10 MG/ML INJ SOLUTION
10 mg/ml | Freq: Once | INTRAMUSCULAR | Status: AC
Start: 2009-12-07 — End: 2009-12-06
  Administered 2009-12-07 (×2): via INTRAVENOUS

## 2009-12-07 MED ORDER — NITROGLYCERIN IN D5W 200 MCG/ML IV
50 mg/2 mL (200 mcg/mL) | INTRAVENOUS | Status: AC
Start: 2009-12-07 — End: 2009-12-06
  Administered 2009-12-07: via INTRAVENOUS

## 2009-12-07 MED ORDER — LISINOPRIL 5 MG TAB
5 mg | Freq: Every day | ORAL | Status: DC
Start: 2009-12-07 — End: 2009-12-08
  Administered 2009-12-07: 12:00:00 via ORAL

## 2009-12-07 MED ORDER — NITROGLYCERIN 0.2MG/ML SYRINGE
0.2 mg/mL | INTRAMUSCULAR | Status: AC
Start: 2009-12-07 — End: ?

## 2009-12-07 MED ORDER — LORAZEPAM 1 MG TAB
1 mg | Freq: Three times a day (TID) | ORAL | Status: DC | PRN
Start: 2009-12-07 — End: 2009-12-09
  Administered 2009-12-07: 08:00:00 via ORAL

## 2009-12-07 MED ORDER — PRASUGREL 10 MG TAB
10 mg | Freq: Every day | ORAL | Status: DC
Start: 2009-12-07 — End: 2009-12-09
  Administered 2009-12-07 – 2009-12-09 (×3): via ORAL

## 2009-12-07 NOTE — Progress Notes (Signed)
Bedside and Verbal shift change report received from [facility], RN(offgoing nurse).  Report given with SBAR, Intake/Output, MAR and Recent Results.

## 2009-12-07 NOTE — Progress Notes (Signed)
Assisted up to chair. Bath/hygiene needs supplied, and pt bathed self. Tolerated well. No problems with cath site. Bed linens changed.

## 2009-12-07 NOTE — Progress Notes (Signed)
Nightly meds given along with a PRN ambien forsleep

## 2009-12-07 NOTE — Progress Notes (Signed)
Interdisciplinary team rounds were held 12/07/2009 with the following team members:Care Management, Nursing, Outcomes Management, Palliative Care and Pharmacy.   Plan of care discussed. See clinical pathway and/or care plan for interventions and desired outcomes.

## 2009-12-07 NOTE — Progress Notes (Signed)
Up ad lib in room. Only c/o is headache, for which Tylenol given. Family in room. Vitals unchanged/stable.

## 2009-12-07 NOTE — Progress Notes (Signed)
Report received from K. Johnston,RN. Time allowed for questions and chart review. Dr. Petra Kuba here. Orders received.

## 2009-12-07 NOTE — Progress Notes (Signed)
Right groin remains benign-ativan PO 1mg  given for sleep; tolerating water; VSS; continue to monitor

## 2009-12-07 NOTE — Progress Notes (Signed)
No bleeding, oozing or hematoma noted to right groin; HOB elevated 15 degrees and patient placed in semi-reverse trendelenburg  position; tolerating sips and chips; occasional PVC's still noted on monitor but patient denies pain or SOB; continue to monitor

## 2009-12-07 NOTE — Progress Notes (Signed)
LEAPFROG NOTE    54 yo WM presenting to [facility] with chest pain and ST elevation on EKG with troponin of 50. Taken to cath lab where LAD with 100% occlusion of LAD but significant disease in RCA and LCx. Stent placed in LAD. Hemodynamically stable on no pressors with adequate oxygenation. CXR with NAD. Nothing to add at this point.     Debbra Riding, MD

## 2009-12-07 NOTE — Progress Notes (Signed)
Follow up in CCU with PT. 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 Provided guided reflection and assured PT of continued prayers.  Rev. Donnie Coffin, M.Div  Chaplain  [employer]. [employer]

## 2009-12-07 NOTE — Progress Notes (Signed)
Right groin 7 FR sheath pulled without difficulty; manual pressure held for 15 min until hemostasis obtained; no bleeding, oozing,or hematoma noted after procedure; patient medicated with 4mg  morphine pre procedure and tolerated well; site covered with 4x4 pressure dressing and sandbag placed; continue to monitor

## 2009-12-07 NOTE — Progress Notes (Signed)
Sitting on SOB with breakfast. No bleeding/hematoma at R groin cath site.

## 2009-12-07 NOTE — Progress Notes (Signed)
S: Doing well. No chest pain.     O: BP 150/95   Pulse 74   Temp 98.7 ??F (37.1 ??C)   Resp 56   Ht 6' 4.5" (1.943 m)   Wt 306 lb 7 oz (139 kg)   BMI 36.81 kg/m2   SpO2 97%  Exam: awake and alert, no jvd, lungs clear, cor regular, no murmur or gallop, abdomen soft, no edema.Groin looks good.       CBC: Recent Labs   Basename 12/06/09 2320 12/06/09 2020   ??? WBC 10.4 10.4   ??? RBC 4.99 5.18   ??? HGB 14.1 14.7   ??? HCT 41.5 42.9   ??? PLT 189 190       BMP: Recent Labs   Basename 12/06/09 2320 12/06/09 2020   ??? NA 135* 139   ??? K 4.3 4.5   ??? BUN 15 16   ??? CREA 1.0 1.0       BNP: Recent Labs   Basename 12/06/09 2030   ??? BNPP 22        A:Patient Active Hospital Problem List:  *Anterior myocardial infarction (12/06/2009)    Essential hypertension, benign (12/06/2009)    Coronary atherosclerosis of native coronary artery (12/06/2009)    Dyslipidemia (12/06/2009)    P: Reassure pt, adjust meds, inc activity as tol.           Roselind Messier, MD     12/07/2009

## 2009-12-08 MED ORDER — ASPIRIN 325 MG TAB
325 mg | Freq: Every day | ORAL | Status: DC
Start: 2009-12-08 — End: 2009-12-09
  Administered 2009-12-08 – 2009-12-09 (×2): via ORAL

## 2009-12-08 MED ORDER — CARVEDILOL 6.25 MG TAB
6.25 mg | Freq: Two times a day (BID) | ORAL | Status: DC
Start: 2009-12-08 — End: 2009-12-09
  Administered 2009-12-08 – 2009-12-09 (×3): via ORAL

## 2009-12-08 MED ORDER — VALSARTAN 40 MG TAB
40 mg | Freq: Every day | ORAL | Status: DC
Start: 2009-12-08 — End: 2009-12-09
  Administered 2009-12-08 – 2009-12-09 (×2): via ORAL

## 2009-12-08 NOTE — Progress Notes (Signed)
Reviewed the initial skin assessment documentation by Curley Spice, RN.  Agree with documentation.      Jacklynn Bue, RN

## 2009-12-08 NOTE — Progress Notes (Signed)
Verbal bedside report given to oncoming RN. Patient's situation, background, assessment and recommendations provided. Opportunity for questions provided. Oncoming RN assumed care of patient.

## 2009-12-08 NOTE — Progress Notes (Signed)
Pt sitting up on side of the bed, pt stated that earlier he felt "light headed" and that he experienced a few palpatations, took pt's BP lying down-136/86 heart rate 81, sitting 134/92 heart rate 81, and standing 120/85 heart rate 84, informed pt to be careful when standing up and to give himself a few minutes sitting on side of the bed before standing and to call for assistance. Will continue to monitor.

## 2009-12-08 NOTE — Progress Notes (Signed)
Definity administered per protocol. ( 2 ml diluted bolus)

## 2009-12-08 NOTE — Progress Notes (Signed)
Reassessed and now awake; patient states he finally slept and feels better; no changes in assessment findings and no c/o; continue POC

## 2009-12-08 NOTE — Progress Notes (Signed)
[facility] PROGRESS NOTE           12/08/2009 7:56 AM    Admit Date: 12/06/2009    Admit Diagnosis:      STEMI      Subjective:   Patient denies any recurrent chest pain or dyspnea.  No events per nursing. Has history of ACE-I induced cough.      Objective:    Filed Vitals:    12/08/09 0203 12/08/09 0400 12/08/09 0451 12/08/09 0600   BP: 113/74  120/81 121/69   Pulse: 79  70 71   Temp:  97.7 ??F (36.5 ??C)     Resp: 58  11 22   Height:       Weight:       SpO2:             Physical Exam:  General-Well Developed, Well Nourished, No Acute Distress, Alert & Oriented x 3, appropriate mood.  Neck- supple, no JVD  CV- regular rate and rhythm no MRG  Lung- clear bilaterally  Abd- soft, nontender, nondistended  Ext- no edema bilaterally.  Cath site without significant hematoma, bruit, or thrill.  Skin- warm and dry      Data Review:   Recent Labs   [facility] 12/06/09 2320 12/06/09 2020   ??? NA 135* 139   ??? K 4.3 4.5   ??? MG -- --   ??? BUN 15 16   ??? CREA 1.0 1.0   ??? GLU 145* 153*   ??? WBC 10.4 10.4   ??? HGB 14.1 14.7   ??? HCT 41.5 42.9   ??? PLT 189 190   ??? INR -- --   ??? TRIGL -- --   ??? LDL -- --   ??? HDL -- --         Assessment/Plan:     Patient Active Hospital Problem List:  *Anterior myocardial infarction:  S/P emergent PCI of LAD with Xience DES on 12/06/09.  Increase ASA to 325 mg a day and continue Effient.  Check echo to evaluate LV function.   Residual disease in circ and RCA.  Defer to Dr. Suzie Portela in regards to staged PCI.    Essential hypertension, benign :  Change atenolol to coreg given recent infarct and LV dysfunction.  Stop lisinopril with history of ACE-I cough.  Start diovan.    Coronary atherosclerosis of native coronary artery (12/06/2009)    Dyslipidemia :  Statin therapy.    Ischemic Cardiomyopathy:  Coreg and diovan.  Check echo.  Fluid balance stable.              Ebony Hail, MD

## 2009-12-08 NOTE — Progress Notes (Signed)
TRANSFER - OUT REPORT:    Verbal report given to [facility] on [name] [name]  being transferred to 309 for routine progression of care       Report consisted of patient???s Situation, Background, Assessment and   Recommendations(SBAR).     Information from the following report(s) SBAR, Kardex, Procedure Summary, MAR and Recent Results was reviewed with the receiving nurse.    Opportunity for questions and clarification was provided.      Will transfer via [facility] with transport staff after am meds given. Family in room, and all notified of new room assignment.

## 2009-12-08 NOTE — Progress Notes (Signed)
AM meds given. Education Copy) given on new meds: Effient, Diovan, and Coreg. Explained per Dr. Theressa Millard, these are better drugs than the Tenormin he was taking, for his heart condition. Echo in progress. Will transfer to 3rd floor when echo complete.

## 2009-12-08 NOTE — Progress Notes (Signed)
Report received from K. Johnston,RN. Time allowed for questions and chart review.

## 2009-12-08 NOTE — Progress Notes (Signed)
TRANSFER - IN REPORT:    Verbal report received from [facility], RN on [name] [name]  being received from ICU/CCU for routine progression of care      Report consisted of patient???s Situation, Background, Assessment and   Recommendations(SBAR).     Information from the following reports was received: Kardex, Procedure Summary, MAR and Recent Results.    Opportunity for questions and clarification was provided.      1120 - Patient received to room 309 and assessment completed. Plan of care reviewed. Patient oriented to room and call light. Voiced understanding to use call light to communicate chest pain and any needs.   Admission skin assessment completed with second RN and reveals a right groin site which is benign. No other skin abnormalities identified.

## 2009-12-08 NOTE — Progress Notes (Addendum)
Interdisciplinary team rounds were held 12/08/2009 with the following team members:Care Management, Nursing, Outcomes Management, Palliative Care, Pharmacy, Physician and Respiratory Therapy.    Plan of care discussed. See clinical pathway and/or care plan for interventions and desired outcomes. Will transfer to telemetry today.

## 2009-12-08 NOTE — Progress Notes (Signed)
Patient medicated with tylenol (see mar) for complaints of headache.

## 2009-12-09 MED ORDER — LOSARTAN 25 MG TAB
25 mg | ORAL_TABLET | Freq: Every day | ORAL | Status: DC
Start: 2009-12-09 — End: 2017-09-14

## 2009-12-09 MED ORDER — PRASUGREL 10 MG TAB
10 mg | ORAL_TABLET | Freq: Every day | ORAL | Status: DC
Start: 2009-12-09 — End: 2010-02-02

## 2009-12-09 MED ORDER — NITROGLYCERIN 0.4 MG SUBLINGUAL TAB
0.4 mg | SUBLINGUAL | Status: DC | PRN
Start: 2009-12-09 — End: 2017-09-14

## 2009-12-09 MED ORDER — VALSARTAN 40 MG TAB
40 mg | ORAL_TABLET | Freq: Every day | ORAL | Status: DC
Start: 2009-12-09 — End: 2009-12-09

## 2009-12-09 MED ORDER — ATORVASTATIN 80 MG TAB
80 mg | ORAL_TABLET | Freq: Every evening | ORAL | Status: DC
Start: 2009-12-09 — End: 2017-09-14

## 2009-12-09 MED ORDER — CARVEDILOL 6.25 MG TAB
6.25 mg | ORAL_TABLET | Freq: Two times a day (BID) | ORAL | Status: DC
Start: 2009-12-09 — End: 2010-02-02

## 2009-12-09 NOTE — Progress Notes (Signed)
Routine follow up with PT previously interacted with. PT presented very thankful and positive in demeanor and conversation. PT anticipated being discharged today. Assured PT of continued prayers.  Rev. Donnie Coffin, M.Div  Chaplain  [employer]. [employer]

## 2009-12-09 NOTE — Progress Notes (Signed)
Report given to Beather Arbour, RN oncoming RN at bedside. Opportunity for questions allowed.

## 2009-12-09 NOTE — Progress Notes (Signed)
[place] Pt. Last Name: [name] Pt. First Name: [name] [name] MR#: [id number] / Admit#: [id number]   [place], [place] [address] DOB: 1955/09/09 / Age: 54  Attn.: Servando Snare  Location: 08 - [id number]        Case Management - Progress Note  Initial Open Date: 12/09/2009   Case Manager: Cleone Slim, RN, MSN    Initial Open Date: 12/09/2009  Social Worker: Altamese Cabal LISW    Expected Date of Discharge: 12/09/2009  Transferred From: Home  ECF Bed Held Until:   Bed Held By:     Power of Attorney:   POA/Guardian/Conservator Capacity:   Primary Caregiver: self  Living Arrangements: Own Home    Source of Income: Employed  Payee:   Psychosocial History:   Cultural/Religious/Language Issues:   Education Level:   ADLS/Current Living Arrangements Issues: Pt. lives with spouse. Independent   for ambulation and ADL's. Employed as a Programmer, multimedia.    Past Providers:     Will patient perform self care at discharge? Y    Anticipated Discharge Disposition Goal: Return to admission address    Assessment/Plan:   12/09/2009 04:33P SW met with pt. b/c of self pay status and need for   assistance with discharge medications. Pt. is alert and oriented in all   spheres. Provided him with Lipitor $4 copay card, Effient 30 day free card,   and [employer] application for ongoing pt. assistance with Effient. K.   Neva Seat, LISW-CP       Resources at Discharge:           Service Providers at Discharge:

## 2009-12-09 NOTE — Progress Notes (Signed)
Patient to private vehicle via wheelchair accompanied by PCT. Discharged home.

## 2009-12-09 NOTE — Progress Notes (Signed)
Removed electrodes and returned monitor to secretary. Removed IVs-sites benign. Reviewed discharge instructions with patient. Patient verbalized understanding of medication compliance and activity limitations. Patient preparing to dress and discharge home.

## 2009-12-09 NOTE — Discharge Summary (Addendum)
[facility] Discharge Summary     Patient ID:  [name] [name]  [id number]  54 y.o.  Sep 16, 1955    Admit date: 12/06/2009    Discharge date:  12/09/2009    Admitting Physician: Joelene Millin, MD     Discharge Physician: Darden Dates. Mariadelosang Wynns, PA/Dr. Laural Benes    Admission Diagnoses:      STEMI    Discharge Diagnoses: Patient Active Problem List   Diagnoses Date Noted   ??? Anterior myocardial infarction [410.10E] 12/06/2009   ??? Essential hypertension, benign [401.1] 12/06/2009   ??? Coronary atherosclerosis of native coronary artery [414.01] 12/06/2009   ??? Dyslipidemia [272.4CR] 12/06/2009       Cardiology Procedures this admission:  Left heart catheterization with percutaneous coronary intervention, echocardiogram  Consults: none    Hospital Course: Patient presented to the emergency department of [facility] with complaints of sudden onset of chest pain while swimming.  Pain was severe and consistent and associated with diaphoresis.  Similar chest pain was noted the day prior.  EKG showed ST elevation in anteroseptal leads.  Patient was urgently transferred to [facility] for further cardiac evaluation and treatment.  Patient was taken to the cath lab for urgent cardiac catheterization.  Culprit lesion was identified as totally occluded proximal LAD that was stented with 3.0 x 28mm Xience DES with 0% residual stenosis.  Chest pain subsided after stent placement.  Patient was also noted to have 70% stenosis of the LCx, 70-80% stenosis of the OM1, 70% stenosis of the RCA, 70% stenosis of a posterior lateral ventricle branch, and diffuse disease of the PDA.  Further intervention will be based on patient's angina symptoms with either staged PCI in the future or CABG.  Patient tolerated the procedure well.  An echocardiogram was performed with report as follows:   - Left ventricle: Systolic function was mildly reduced. Ejection fraction was estimated in the range of 40 % to 45 %. There was akinesis of the mid anteroseptal and apical wall(s). There was moderate hypokinesis of the mid-apical anterior wall(s). There was moderate concentric hypertrophy. Doppler parameters were consistent with mild diastolic dysfunction (grade 1).  - Left atrium: The atrium was mildly dilated.  - Pericardium: A trivial pericardial effusion was identified posterior to the heart. There was no evidence of hemodynamic compromise.    Patient was placed on medical therapy for CAD and monitored closely.  Patient denied any further chest pain.  The morning of 12/09/2009 patient was up feeling well without any complaints of chest pain, shortness of breath or palpitations. Patient's right groin cath site was clean, dry and intact without hematoma or bruit. Patient's labs were WNL. Patient was seen and examined by Dr. Laural Benes and determined stable and ready for discharge. Patient was instructed on the importance of medication compliance including taking aspirin and effient everyday without missing a dose.  The patient will need to be on dual anti-platelet therapy indefinately.  For maximized medical therapy of CAD, patient was also started on BB, ARB, and statin.  Patient will need repeat lipid panel and LFTs in 6 weeks.     DISPOSITION: The patient is being discharged home in stable condition on a low saturated fat, low cholesterol and low salt diet. The patient is instructed to advance activities as tolerated to the limit of fatigue or shortness of breath but to maintain light activity only for 2 weeks or until seen in follow up. The patient is instructed to avoid all heavy lifting, straining, stooping or squatting for  3-5 days.  The patient is instructed to watch the cath site for bleeding/oozing; if seen, the patient is instructed to apply firm pressure with a clean cloth and call [facility] at [phone]. The patient is instructed to watch for signs of infection which include: increasing area of redness, fever/hot to touch or purulent drainage at the catheterization site. The patient is instructed not to soak in a bathtub for 7-10 days, but is cleared to shower. The patient is instructed to call the office or return to the ER for immediate evaluation for any shortness of breath or chest pain not relieved by NTG.    Follow up with [facility] Dr. Suzie Portela on September 1st at 8:30am [facility]-located at [employer])  Office Phone: [phone]    Discharge Exam: BP 134/84   Pulse 82   Temp 98 ??F (36.7 ??C)   Resp 15   Ht 6\' 3"  (1.905 m)   Wt 300 lb 4.8 oz (136.215 kg)   BMI 37.53 kg/m2   SpO2 96%  Pt has been seen by Dr. Laural Benes: see his progress note for exam details.      Patient Instructions:   Current Discharge Medication List      START taking these medications       carvedilol (COREG) 6.25 mg tablet    Take 1 Tab by mouth two (2) times daily (with meals).    Qty: 60 Tab Refills: 11        atorvastatin (Lipitor) 80 mg tablet    Take 1 Tab by mouth nightly.    Qty: 30 Tab Refills: 11        losartan (Cozaar) 25 mg tablet    Take 1 Tab by mouth daily.    Qty: 30 Tab Refills: 11        prasugrel (EFFIENT) 10 mg tablet    Take 1 Tab by mouth daily.    Qty: 30 Tab Refills: 11         nitroglycerin (NITROSTAT) 0.4 mg SL tablet    1 Tab by SubLINGual route every five (5) minutes as needed for Chest Pain.    Qty: 2 Bottle Refills: 4          CONTINUE these medications which have NOT CHANGED       aspirin (ASPIRIN) 325 mg tablet    Take 325 mg by mouth daily. Took 2 prior to arrival           STOP taking these medications       atenolol (TENORMIN) 50 mg tablet    Comments:     Reason for Stopping: replaced with Coreg                Signed:  Victorino Dike C. Lacrystal Barbe, PA-C  12/09/2009  8:07 AM

## 2009-12-09 NOTE — Progress Notes (Signed)
[facility] Progress Note    Admit Date: 12/06/2009     Admitting Physician: Joelene Millin, MD      Subjective:   No cp.  Active, wants to go home.  He has distal RCA disease and Cx occlusion.  EF 45% - anteroapical hypokinesis.    Current Problems:  Patient Active Hospital Problem List:  *Anterior myocardial infarction (12/06/2009)    Essential hypertension, benign (12/06/2009)    Coronary atherosclerosis of native coronary artery (12/06/2009)    Dyslipidemia (12/06/2009)      Medications:  Current facility-administered medications   Medication Dose Route Frequency Provider Last Rate Last Dose   ??? carvedilol (COREG) tablet 6.25 mg  6.25 mg Oral BID WITH MEALS Joelene Millin, MD   Last Dose: 6.25 mg at 12/08/09 1705   ??? aspirin (ASPIRIN) tablet 325 mg  325 mg Oral DAILY Joelene Millin, MD   Last Dose: 325 mg at 12/08/09 1018   ??? valsartan (DIOVAN) tablet 40 mg  40 mg Oral DAILY Joelene Millin, MD   Last Dose: 40 mg at 12/08/09 1018   ??? SALINE PERIPHERAL FLUSH Q8H 5 mL  5 mL InterCATHeter Q8H Servando Snare, MD   Last Dose: 5 mL at 12/08/09 2042   ??? saline peripheral flush 5 mL  5 mL InterCATHeter PRN Servando Snare, MD       ??? nitroglycerin (NITROLINGUAL) sublingual 0.4 mg/spray   1 Spray SubLINGual Q5MIN PRN Servando Snare, MD       ??? metoclopramide (REGLAN) injection 10 mg  10 mg IntraVENous Q4H PRN Servando Snare, MD       ??? magnesium hydroxide (MILK OF MAGNESIA) oral suspension 30 mL  30 mL Oral DAILY PRN Servando Snare, MD       ??? lorazepam (ATIVAN) tablet 1 mg  1 mg Oral Q8H PRN Servando Snare, MD   Last Dose: 1 mg at 12/07/09 0343   ??? atorvastatin (LIPITOR) tablet 80 mg  80 mg Oral QHS Servando Snare, MD   Last Dose: 80 mg at 12/08/09 2041   ??? acetaminophen (TYLENOL) tablet 650 mg  650 mg Oral Q4H PRN Servando Snare, MD   Last Dose: 650 mg at 12/08/09 1133    ??? hydrocodone-acetaminophen (NORCO) 7.5-325 mg per tablet 1 Tab  1 Tab Oral Q4H PRN Servando Snare, MD   Last Dose: 1 Tab at 12/08/09 2041   ??? prasugrel (EFFIENT) tablet 10 mg  10 mg Oral DAILY Servando Snare, MD   Last Dose: 10 mg at 12/08/09 1018   ??? zolpidem (AMBIEN) tablet 10 mg  10 mg Oral QHS PRN Servando Snare, MD   Last Dose: 10 mg at 12/07/09 2117   ??? lip protectant (BLISTEX) ointment   Topical PRN Servando Snare, MD               Exam:     VITALS: BP 134/84   Pulse 82   Temp 98 ??F (36.7 ??C)   Resp 15   Ht 6\' 3"  (1.905 m)   Wt 300 lb 4.8 oz (136.215 kg)   BMI 37.53 kg/m2   SpO2 96%  RHYTHM:reg  HEAD/NECK: PEERL, no JVD, carotid upstrokes normal without bruits, no thyromegaly   CHEST: clear to auscultation and percussion.  CARDIAC: normal S1, S2, without murmur, click, or gallop. Apical impulse normal.  ABDOMEN: soft & nontender, without mass, bruit, organomegaly.  EXTREMITIES: pulses are full and  symmetrical; no edema.  MUSCULOSKELETAL: normal to inspection and palpation; normal ROM.  NEURO: normal symmetrical strength, sensation, and cranial nerves.  No focal deficits.  SKIN: warm and dry.  MENTAL STATUS: oriented in all spheres, normal mood and affect.  Labs:  Recent Labs   Basename 12/06/09 2320 12/06/09 2030 12/06/09 2020   ??? NA 135* -- 139   ??? K 4.3 -- 4.5   ??? BUN 15 -- 16   ??? CREA 1.0 -- 1.0   ??? GLU 145* -- 153*   ??? MG -- -- --   ??? TROIP >50.00* -- --   ??? BNPP -- 22 --   ??? TSH2 -- -- --   ??? WBC 10.4 -- 10.4   ??? HGB 14.1 -- 14.7   ??? HCT 41.5 -- 42.9   ??? PLT 189 -- 190   ??? INR -- -- --   ??? CHOL 230* -- --   ??? TGL 474* -- --   ??? LDLC Not calculated due to elevated triglyceride level -- --   ??? HDLC 24* -- --          Assessment and Plan:     Patient Active Hospital Problem List:  *Anterior myocardial infarction (12/06/2009) - LAD stent. Three vessel disease.    Essential hypertension, benign (12/06/2009)    Coronary atherosclerosis of native coronary artery (12/06/2009)    Dyslipidemia (12/06/2009)      May go home.  F/U 2 weeks.  Monitor clinically to determine need for med Rx vs revasc of RCA.    Arizona Constable, MD  [facility]

## 2010-01-27 LAB — LDL, DIRECT: LDL,Direct: 56 mg/dl (ref <100)

## 2010-01-27 LAB — METABOLIC PANEL, COMPREHENSIVE
A-G Ratio: 1.3 (ref 1.2–3.5)
ALT (SGPT): 56 U/L (ref 39–65)
AST (SGOT): 24 U/L (ref 15–37)
Albumin: 4.3 g/dL (ref 3.5–5.0)
Alk. phosphatase: 117 U/L (ref 50–136)
Anion gap: 10 mmol/L (ref 7–16)
BUN: 21 MG/DL (ref 6–23)
Bilirubin, total: 0.4 MG/DL (ref 0.2–1.1)
CO2: 26 MMOL/L (ref 23–32)
Calcium: 8.7 MG/DL (ref 8.3–10.4)
Chloride: 103 MMOL/L (ref 98–107)
Creatinine: 1.1 MG/DL (ref 0.8–1.5)
GFR est AA: >60 mL/min/{1.73_m2} (ref >60)
GFR est non-AA: >60 mL/min/{1.73_m2} (ref >60)
Globulin: 3.3 g/dL (ref 2.3–3.5)
Glucose: 91 MG/DL (ref 65–100)
Potassium: 4.5 MMOL/L (ref 3.5–5.1)
Protein, total: 7.6 g/dL (ref 6.3–8.2)
Sodium: 139 MMOL/L (ref 136–145)

## 2010-01-27 LAB — LIPID PANEL
CHOL/HDL Ratio: 4.5
Cholesterol, total: 104 MG/DL (ref <200)
HDL Cholesterol: 23 MG/DL — ABNORMAL LOW (ref 40–60)
LDL, calculated: 37.4 MG/DL (ref <100)
Triglyceride: 218 MG/DL — ABNORMAL HIGH (ref 35–150)
VLDL, calculated: 43.6 MG/DL — ABNORMAL HIGH (ref 7.0–27.0)

## 2010-01-31 MED ORDER — NUCLEAR MEDICINE ISOTOPE
Freq: Once | Status: AC
Start: 2010-01-31 — End: 2010-01-31
  Administered 2010-01-31: 12:00:00

## 2010-02-01 LAB — EKG TREADMILL STRESS TEST
Max. Diastolic BP: 100 mmHg
Max. Heart rate: 150 {beats}/min
Max. Systolic BP: 180 mmHg
Max. predicted heart rate: 167 {beats}/min
Time-in-exercise phase: 420 S

## 2010-02-01 MED ORDER — NUCLEAR MEDICINE ISOTOPE
Freq: Once | Status: AC
Start: 2010-02-01 — End: 2010-02-01
  Administered 2010-02-01: 13:00:00

## 2010-02-01 NOTE — Procedures (Signed)
Procedures signed by Seabron Spates, MD at 02/11/10 1158                 Author: Seabron Spates, MD  Service: --  Author Type: Physician       Filed: 02/11/10 1158  Date of Service: 02/01/10 1638  Status: Signed          Editor: Seabron Spates, MD (Physician)            Procedure Orders        1. STRESS TEST CARDIAC [[id number]] ordered by  at 02/01/10 1638                         <!--EPICS-->                           [place]  [facility]<BR>                           [address]<BR>                          [place],  [place]. [address]<BR>                               [phone]<BR> <BR>                          CARDIOPULMONARY STRESS<BR> <BR> NAME:  [name], [name]                              MR:  [id number]<YN> ROOM:  NM                   SEX:  M               ACCT:  [id number]  DOB:  11/18/1955            AGE:  54              PT:  O<BR> ADMIT:  02/01/2010          DSCH:<BR> <BR> NUCLEAR MEDICINE STRESS TEST<BR> <BR> CLINICAL:  Coronary disease.<BR> <BR> The patient excised on Bruce protocol.  Resting heart rate 66, blood<BR>  pressure 122/80.  He exercised into stage 3 of the Bruce protocol.  The<BR> peak heart rate was 150, blood pressure 180/90.  No symptoms were<BR> reported.  Imaging was acquired with achievement of target heart rate.<BR> The resting electrocardiogram  revealed downward axis and septal Q-waves.<BR> The stress EKG showed mild J point depression inferolaterally.<BR> <BR> Imaging was acquired with tetrofosmin 9m Myoview 45 mCi for stress, 35<BR> mCi for rest.  The left ventricle was imaged in short axis,  vertical<BR> long, horizontal long axis views.  Computer assisted tomographic<BR> analysis, EKG, gating and wall motion performed.<BR> <BR> INTERPRETATION:  The imaging revealed a severe septal perfusion deficit<BR> extending to the apex and this was  a fairly large distribution.  There<BR> was milder inferior perfusion deficit of a moderate degree.  On the<BR> anterolateral wall  perfusion appeared normal.  There were no reversible<BR> defects.  The gated acquisition revealed corresponding wall motion<BR>  abnormalities, inferior hypokinesis, apical akinesis and a calculated<BR> ejection fraction of 38%.<BR> <BR> IMPRESSION: Fixed inferior septal and  apical scar.  Calculated ejection<BR> fraction 38%.<BR> <BR> <BR> <BR> <BR> [name] [name], MD<BR> <BR>              This is an unverified document unless signed by physician.<BR> <BR> TID:  sms                                      DT:  02/01/2010  4:38 P<BR> DOC#:  [id number]          DD:  02/01/2010         JOB:  [id number]<BR> <BR> cc:   Seabron Spates, MD<BR>        Servando Snare, MD<BR> <!--EPICE-->

## 2010-02-01 NOTE — Procedures (Signed)
[facility]   [address]   [place], [place]. [address]   [phone]     CARDIOPULMONARY STRESS    NAME: Chesney, Klimaszewski MR: [id number]  ROOM: NM SEX: Gillermina Hu: [id number]  DOB: 20-May-1955 AGE: 54 PT: O  ADMIT: 02/01/2010 DSCH:    NUCLEAR MEDICINE STRESS TEST    CLINICAL: Coronary disease.    The patient excised on Bruce protocol. Resting heart rate 66, blood  pressure 122/80. He exercised into stage 3 of the Bruce protocol. The  peak heart rate was 150, blood pressure 180/90. No symptoms were  reported. Imaging was acquired with achievement of target heart rate.  The resting electrocardiogram revealed downward axis and septal Q-waves.  The stress EKG showed mild J point depression inferolaterally.    Imaging was acquired with tetrofosmin 24m Myoview 45 mCi for stress, 35  mCi for rest. The left ventricle was imaged in short axis, vertical  long, horizontal long axis views. Computer assisted tomographic  analysis, EKG, gating and wall motion performed.    INTERPRETATION: The imaging revealed a severe septal perfusion deficit  extending to the apex and this was a fairly large distribution. There  was milder inferior perfusion deficit of a moderate degree. On the  anterolateral wall perfusion appeared normal. There were no reversible  defects. The gated acquisition revealed corresponding wall motion  abnormalities, inferior hypokinesis, apical akinesis and a calculated  ejection fraction of 38%.    IMPRESSION: Fixed inferior septal and apical scar. Calculated ejection  fraction 38%.          Seabron Spates, MD     This is an unverified document unless signed by physician.    TID: sms DT: 02/01/2010 4:38 P  DOC#: [id number] DD: 02/01/2010 JOB: [id number]    cc: Seabron Spates, MD   Servando Snare, MD

## 2010-02-02 ENCOUNTER — Inpatient Hospital Stay
Admit: 2010-02-02 | Discharge: 2010-02-03 | Disposition: A | Discharge status: Home / Self Care | Source: Home / Self Care | Attending: Cardiovascular Disease | Admitting: Cardiovascular Disease

## 2010-02-02 DIAGNOSIS — I200 Unstable angina: Secondary | ICD-10-CM

## 2010-02-02 LAB — EKG, 12 LEAD, INITIAL
Atrial Rate: 63 {beats}/min
Atrial Rate: 73 {beats}/min
Calculated P Axis: 40 degrees
Calculated P Axis: 40 degrees
Calculated R Axis: -37 degrees
Calculated R Axis: -40 degrees
Calculated T Axis: 66 degrees
Calculated T Axis: 77 degrees
Diagnosis: NORMAL
P-R Interval: 168 ms
P-R Interval: 172 ms
Q-T Interval: 400 ms
Q-T Interval: 416 ms
QRS Duration: 106 ms
QRS Duration: 118 ms
QTC Calculation (Bezet): 409 ms
QTC Calculation (Bezet): 458 ms
Ventricular Rate: 63 {beats}/min
Ventricular Rate: 73 {beats}/min

## 2010-02-02 LAB — CBC WITH AUTOMATED DIFF
ABS. BASOPHILS: 0 10*3/uL (ref 0.0–0.2)
ABS. EOSINOPHILS: 0.4 10*3/uL (ref 0.0–0.8)
ABS. IMM. GRANS.: 0 10*3/uL (ref 0.0–0.5)
ABS. LYMPHOCYTES: 1.4 10*3/uL (ref 0.5–4.6)
ABS. MONOCYTES: 0.6 10*3/uL (ref 0.1–1.3)
ABS. NEUTROPHILS: 3.9 10*3/uL (ref 1.7–8.2)
BASOPHILS: 1 % (ref 0.0–2.0)
EOSINOPHILS: 6 % (ref 0.5–7.8)
HCT: 41.3 % (ref 41.1–50.3)
HGB: 13.6 g/dL (ref 13.2–17.1)
IMMATURE GRANULOCYTES: 0.2 % (ref 0.0–5.0)
LYMPHOCYTES: 23 % (ref 13–44)
MCH: 27.6 PG (ref 26.1–32.9)
MCHC: 32.9 g/dL (ref 31.4–35.0)
MCV: 83.9 FL (ref 79.6–97.8)
MONOCYTES: 9 % (ref 4.0–12.0)
MPV: 11 FL (ref 10.8–14.1)
NEUTROPHILS: 61 % (ref 43–78)
PLATELET: 177 10*3/uL (ref 150–450)
RBC: 4.92 M/uL (ref 4.23–5.67)
RDW: 14.1 % (ref 11.9–14.6)
WBC: 6.4 10*3/uL (ref 4.3–11.1)

## 2010-02-02 LAB — METABOLIC PANEL, COMPREHENSIVE
A-G Ratio: 1.2 (ref 1.2–3.5)
ALT (SGPT): 46 U/L (ref 39–65)
AST (SGOT): 18 U/L (ref 15–37)
Albumin: 3.8 g/dL (ref 3.5–5.0)
Alk. phosphatase: 117 U/L (ref 50–136)
Anion gap: 10 mmol/L (ref 7–16)
BUN: 25 MG/DL — ABNORMAL HIGH (ref 6–23)
Bilirubin, total: 0.4 MG/DL (ref 0.2–1.1)
CO2: 24 MMOL/L (ref 23–32)
Calcium: 8.7 MG/DL (ref 8.3–10.4)
Chloride: 104 MMOL/L (ref 98–107)
Creatinine: 1 MG/DL (ref 0.8–1.5)
GFR est AA: >60 mL/min/{1.73_m2} (ref >60)
GFR est non-AA: >60 mL/min/{1.73_m2} (ref >60)
Globulin: 3.3 g/dL (ref 2.3–3.5)
Glucose: 112 MG/DL — ABNORMAL HIGH (ref 65–100)
Potassium: 3.9 MMOL/L (ref 3.5–5.1)
Protein, total: 7.1 g/dL (ref 6.3–8.2)
Sodium: 138 MMOL/L (ref 136–145)

## 2010-02-02 LAB — TROPONIN I: Troponin-I, Qt.: 0.04 NG/ML (ref 0.04–0.05)

## 2010-02-02 MED ORDER — NITROGLYCERIN 2 % TRANSDERMAL OINTMENT
2 % | Freq: Four times a day (QID) | TRANSDERMAL | Status: DC
Start: 2010-02-02 — End: 2010-02-02
  Administered 2010-02-02 (×2): via TOPICAL

## 2010-02-02 MED ORDER — PRASUGREL 10 MG TAB
10 mg | Freq: Every day | ORAL | Status: DC
Start: 2010-02-02 — End: 2010-02-02

## 2010-02-02 MED ORDER — PRASUGREL 10 MG TAB
10 mg | ORAL_TABLET | Freq: Every day | ORAL | Status: DC
Start: 2010-02-02 — End: 2017-09-14

## 2010-02-02 MED ORDER — CARVEDILOL 12.5 MG TAB
12.5 mg | Freq: Two times a day (BID) | ORAL | Status: DC
Start: 2010-02-02 — End: 2010-02-02
  Administered 2010-02-02: 18:00:00 via ORAL

## 2010-02-02 MED ORDER — SALINE PERIPHERAL FLUSH Q8H
Freq: Three times a day (TID) | INTRAMUSCULAR | Status: DC
Start: 2010-02-02 — End: 2010-02-02
  Administered 2010-02-02 (×2)

## 2010-02-02 MED ORDER — IOVERSOL 350 MG/ML IV SOLN
350 mg iodine/mL | Freq: Once | INTRAVENOUS | Status: AC
Start: 2010-02-02 — End: 2010-02-02
  Administered 2010-02-02: 15:00:00 via INTRA_ARTERIAL

## 2010-02-02 MED ORDER — ACETAMINOPHEN 325 MG TABLET
325 mg | ORAL | Status: DC | PRN
Start: 2010-02-02 — End: 2010-02-02

## 2010-02-02 MED ORDER — VERAPAMIL 2.5 MG/ML IV
2.5 mg/mL | Freq: Once | INTRAVENOUS | Status: AC
Start: 2010-02-02 — End: 2010-02-02
  Administered 2010-02-02: 14:00:00 via INTRA_ARTERIAL

## 2010-02-02 MED ORDER — HYDROMORPHONE (PF) 1 MG/ML IJ SOLN
1 mg/mL | INTRAMUSCULAR | Status: DC | PRN
Start: 2010-02-02 — End: 2010-02-02

## 2010-02-02 MED ORDER — ASPIRIN 325 MG TAB, DELAYED RELEASE
325 mg | Freq: Once | ORAL | Status: AC
Start: 2010-02-02 — End: 2010-02-02
  Administered 2010-02-02: 14:00:00 via ORAL

## 2010-02-02 MED ORDER — NITROGLYCERIN 0.4 MG SUBLINGUAL TAB
0.4 mg | SUBLINGUAL | Status: DC | PRN
Start: 2010-02-02 — End: 2010-09-20

## 2010-02-02 MED ORDER — HEPARIN (PORCINE) IN NS (PF) 2,000 UNIT/1,000 ML IV
2000 unit/1,000 mL | INTRAVENOUS | Status: DC
Start: 2010-02-02 — End: 2010-02-02
  Administered 2010-02-02 (×2): via INTRA_ARTERIAL

## 2010-02-02 MED ORDER — HEPARIN (PORCINE) 10,000 UNIT/ML IJ SOLN
10000 unit/mL | Freq: Once | INTRAMUSCULAR | Status: AC
Start: 2010-02-02 — End: 2010-02-02
  Administered 2010-02-02 (×3): via INTRA_ARTERIAL

## 2010-02-02 MED ORDER — DEXTROSE 5% IN NORMAL SALINE IV
INTRAVENOUS | Status: DC
Start: 2010-02-02 — End: 2010-02-02
  Administered 2010-02-02: 17:00:00 via INTRAVENOUS

## 2010-02-02 MED ORDER — NITROGLYCERIN 0.2MG/ML SYRINGE
0.2 mg/mL | INTRAMUSCULAR | Status: AC
Start: 2010-02-02 — End: ?

## 2010-02-02 MED ORDER — VERAPAMIL 2.5 MG/ML IV
2.5 mg/mL | INTRAVENOUS | Status: AC
Start: 2010-02-02 — End: ?

## 2010-02-02 MED ORDER — SODIUM CHLORIDE 0.9 % IV
INTRAVENOUS | Status: DC
Start: 2010-02-02 — End: 2010-02-02
  Administered 2010-02-02: 13:00:00 via INTRAVENOUS

## 2010-02-02 MED ORDER — MAGNESIUM HYDROXIDE 400 MG/5 ML ORAL SUSP
400 mg/5 mL | Freq: Every day | ORAL | Status: DC | PRN
Start: 2010-02-02 — End: 2010-02-02

## 2010-02-02 MED ORDER — MIDAZOLAM 1 MG/ML IJ SOLN
1 mg/mL | INTRAMUSCULAR | Status: AC
Start: 2010-02-02 — End: ?

## 2010-02-02 MED ORDER — SALINE PERIPHERAL FLUSH PRN
INTRAMUSCULAR | Status: DC | PRN
Start: 2010-02-02 — End: 2010-02-02

## 2010-02-02 MED ORDER — ATORVASTATIN 40 MG TAB
40 mg | Freq: Every evening | ORAL | Status: DC
Start: 2010-02-02 — End: 2010-02-02

## 2010-02-02 MED ORDER — HYDROMORPHONE (PF) 1 MG/ML IJ SOLN
1 mg/mL | Freq: Once | INTRAMUSCULAR | Status: AC
Start: 2010-02-02 — End: 2010-02-02
  Administered 2010-02-02: 14:00:00 via INTRAVENOUS

## 2010-02-02 MED ORDER — PRASUGREL 10 MG TAB
10 mg | Freq: Once | ORAL | Status: AC
Start: 2010-02-02 — End: 2010-02-02
  Administered 2010-02-02: 15:00:00 via ORAL

## 2010-02-02 MED ORDER — LORAZEPAM 1 MG TAB
1 mg | Freq: Three times a day (TID) | ORAL | Status: DC | PRN
Start: 2010-02-02 — End: 2010-02-02

## 2010-02-02 MED ORDER — ZOLPIDEM 5 MG TAB
5 mg | Freq: Every evening | ORAL | Status: DC | PRN
Start: 2010-02-02 — End: 2010-02-02

## 2010-02-02 MED ORDER — HYDROMORPHONE (PF) 1 MG/ML IJ SOLN
1 mg/mL | INTRAMUSCULAR | Status: AC
Start: 2010-02-02 — End: ?

## 2010-02-02 MED ORDER — LOSARTAN 25 MG TAB
25 mg | Freq: Every day | ORAL | Status: DC
Start: 2010-02-02 — End: 2010-02-02
  Administered 2010-02-02: 18:00:00 via ORAL

## 2010-02-02 MED ORDER — ASPIRIN 325 MG TAB
325 mg | Freq: Every day | ORAL | Status: DC
Start: 2010-02-02 — End: 2010-02-02

## 2010-02-02 MED ORDER — ASPIRIN 325 MG TAB, DELAYED RELEASE
325 mg | ORAL | Status: AC
Start: 2010-02-02 — End: ?

## 2010-02-02 MED ORDER — HEPARIN (PORCINE) 10,000 UNIT/ML IJ SOLN
10000 unit/mL | INTRAMUSCULAR | Status: AC
Start: 2010-02-02 — End: ?

## 2010-02-02 MED ORDER — IOVERSOL 350 MG/ML IV SOLN
350 mg iodine/mL | INTRAVENOUS | Status: AC
Start: 2010-02-02 — End: ?

## 2010-02-02 MED ORDER — MIDAZOLAM 1 MG/ML IJ SOLN
1 mg/mL | INTRAMUSCULAR | Status: DC | PRN
Start: 2010-02-02 — End: 2010-02-02
  Administered 2010-02-02 (×2): via INTRAVENOUS

## 2010-02-02 MED ORDER — SODIUM CHLORIDE 0.9 % IV
INTRAVENOUS | Status: DC
Start: 2010-02-02 — End: 2010-02-02
  Administered 2010-02-02: 22:00:00 via INTRAVENOUS

## 2010-02-02 MED ORDER — HEPARIN (PORCINE) IN NS (PF) 2,000 UNIT/1,000 ML IV
2000 unit/1,000 mL | INTRAVENOUS | Status: AC
Start: 2010-02-02 — End: ?

## 2010-02-02 MED ORDER — ASPIRIN 325 MG TAB, DELAYED RELEASE
325 mg | ORAL | Status: DC
Start: 2010-02-02 — End: 2010-02-02
  Administered 2010-02-02: 14:00:00 via ORAL

## 2010-02-02 MED ORDER — MORPHINE 2 MG/ML INJECTION
2 mg/mL | INTRAMUSCULAR | Status: DC | PRN
Start: 2010-02-02 — End: 2010-02-02

## 2010-02-02 MED ORDER — NITROGLYCERIN 0.4 MG/DOSE TRANSLINGUAL SPRAY
400 mcg/spray | Status: DC | PRN
Start: 2010-02-02 — End: 2010-02-02

## 2010-02-02 MED ORDER — PRASUGREL 10 MG TAB
10 mg | ORAL | Status: AC
Start: 2010-02-02 — End: ?

## 2010-02-02 MED ORDER — NITROGLYCERIN 0.2MG/ML SYRINGE
0.2 mg/mL | Freq: Once | INTRAMUSCULAR | Status: AC
Start: 2010-02-02 — End: 2010-02-02
  Administered 2010-02-02: 14:00:00 via INTRA_ARTERIAL

## 2010-02-02 NOTE — Procedures (Signed)
Procedures signed by Seabron Spates, MD at 02/11/10 1159                 Author: Seabron Spates, MD  Service: --  Author Type: Physician       Filed: 02/11/10 1159  Date of Service: 02/02/10 1524  Status: Signed          Editor: Seabron Spates, MD (Physician)            Procedure Orders        1. TRANSCRIBED CATH LAB DOCUMENTS [[id number]] ordered by  at 02/02/10 1524                         <!--EPICS-->                           [name]  [facility]<BR>                           [address]<BR>                          [name] [address]<BR>                               [phone]<BR> <BR>            [facility] - CATH LAB REPORT<BR> <BR> NAME:  [name] [name], [name] [name]                              MR:  [id number]<OZ> LOC:  30  [address]             SEX:  M                ACCT:  [id number] DOB:  1955-09-24            AGE:  54              PT:  I<BR> ADMIT:  02/02/2010          DSCH:                 MSV:  MED<BR> <BR> <BR> DATE OF PROCEDURE:  02/02/2010<BR> <BR> PROCEDURES:<BR> 1. Left heart catheterization.<BR>  2. Coronary arteriograms.<BR> 3. Left ventriculogram.<BR> 4. Percutaneous coronary intervention.<BR> <BR> CLINICAL:  This is a gentleman with known coronary disease, previous<BR> total occlusion of the LAD, percutaneous coronary intervention and<BR> stenting.  He has not felt well. He underwent a recent nuclear medicine<BR> stress test that showed a reduced EF of 35% with inferior posterior<BR> defect that appeared primarily fixed, as well as a septal defect that<BR> appeared primarily fixed. He returns to  the emergency room with chest<BR> discomfort, pressure, tightness, as well as tachy palpitations. The<BR> initial EKG and enzymes are negative. I elected to perform a repeat<BR> cardiac catheterization in light of his known coronary disease and<BR> increasing  symptoms. This was performed with a right radial approach.<BR> Right radial artery was cannulated using modified  Seldinger approach.<BR> Catheterization performed using a 6-French Terumo sheath, JL 3.5, JR 5<BR> catheters. The coronary addition was completed  successfully using an AL-1<BR> guide, a BMW wire, BHW wire, and a Cougar wire and a buddy wire tandem<BR> to  stent the distal right coronary  artery. A 30 x 28 XIENCE drug-eluting<BR> stent was implanted and post dilated to high pressure with noncompliant<BR>  TREK balloon.<BR> <BR> He tolerated the procedure well. At the conclusion, the radial artery<BR> sheath was withdrawn. Local hemostasis obtained using Terumo radial band.<BR> <BR> <BR> Anticoagulation with heparin. ACD guidance.<BR> <BR> CORONARY ARTERIOGRAMS:<BR>  1. Left main coronary was normal. It gave rise to LAD and circumflex<BR> coronaries as well as two small caliber proximal diagonal vessels.<BR> 2. LAD: There was a fairly dramatic tapering from the left main to the<BR> coronaries suggesting some ectasia  in the left main. The LAD had evidence<BR> of previous proximal stenting. The stented segment appeared very<BR> satisfactory with good inflow and outflow appearance. There was flow to a<BR> small caliber diagonal that arose just from the distal end of  the stent.<BR> The LAD flow also was good to the cardiac apex with minor irregularity in<BR> the vessel.<BR> 3. The circumflex coronary contained proximal ectasia. There is 50% to<BR> 60% narrowing in the mid vessel marginal and distally the circumflex  was<BR> quite small and appeared to be subtotally occluded with a marginal<BR> filling late retrograde.<BR> 4. The there were 2 high diagonals versus ramus vessels that were<BR> tortuous and contained a 50% proximal stenoses.<BR> 5. There was some evidence  of left to right filling of what appeared to<BR> be a distal RV marginal from the right coronary.<BR> 6. The right coronary was selectively injected and was a large ectatic<BR> vessel as well and contained diffuse irregularity and tortuosity<BR>  proximally  and through the midportion. The caliber of the trunk of the<BR> right coronary was good throughout up until the terminal branches of the<BR> right coronary artery. There was a dual posterior descending type pattern<BR> with the more distal branch being  the largest of the two. There was 60%<BR> stenosis prior to the proximal branch with proximal branch being<BR> diffusely diseased and 85% stenosis prior to the distal larger caliber<BR> posterior descending. This extended fairly distal and was free of<BR>  significant disease in the runoff vessel.<BR> <BR> It was elected to proceed to percutaneous intervention to the very high<BR> grade stenosis of the distal right coronary artery. Initially, a JR 4<BR> guide was used, but this did not provide satisfactory  backup. We then<BR> switched to the aforementioned AL1, a BHW wire with Cougar wire were<BR> deployed distally and the lesion was predilated with 2.5 x 15 TREK<BR> balloon and then the the stent was able to be deployed distally after the<BR> buddy wire  system. Postdilatation was 3-0 high pressure at 18<BR> atmospheres. Following angiograms revealed a stable appearance to the<BR> right coronary trunk and resolution of the aforementioned high-grade<BR> stenosis distally in the right coronary artery with  less than 10%<BR> residual stenosis. There was TIMI-3 flow into both the terminal PDA as<BR> well as the jailed smaller proximal PDA.<BR> <BR> IMPRESSION:<BR> 1. Diffuse coronary disease as described.<BR> 2. Inferior apical hypokinesis of left ventricle  with an  ejection<BR> fraction of 45%.<BR> 3. Continued satisfactory appearance of proximal LAD stenting.<BR> 4. Totally occluded distal circumflex.<BR> 5. Successful PTCA and stenting of very high-grade stenosis involving a<BR> large distal posterior  descending branch.<BR> <BR> <BR> <BR> <BR> <BR> <BR> <BR> Janera Peugh E Graig Hessling, MD<BR> <BR>             This is an unverified document unless signed by  physician.<BR> <BR> TID:  wmx  DT:  02/02/2010  3:24 P<BR> JOB:  [id number]         DOC#:  [id number]           DD:  02/02/2010<BR> <BR> cc:   Seabron Spates, MD<BR> <!--EPICE-->

## 2010-02-02 NOTE — Discharge Summary (Signed)
[facility]   [address]   [place], [place]. [address]   [phone]     DISCHARGE SUMMARY    NAME: [name] [name], [name] [name] MR: [id number]  LOC: 03 [address] SEX: M ACCT: [id number]  DOB: [birth year] AGE: 54 PT: I  ADMIT: 02/02/2010 DSCH: MSV: MED      ADMISSION DIAGNOSIS: Unstable angina.    DISCHARGE DIAGNOSES  1. Unstable angina.  2. Dyslipidemia.  3. Hypertension.  4. Coronary artery disease.    HOSPITAL COURSE: A 54 year old Caucasian gentleman, who is status post  acute anterior myocardial infarction in August of this year with  subsequent cardiac catheterization at that time with the culprit lesion  being a proximal LAD. He underwent successful PCI and stent deployment at  that time and has been on Effient and aspirin. He returned today with a  sensation of his "racing heart" early this morning and an uncomfortable  feeling in his chest which was relieved by taking 2 sublingual  nitroglycerins. He was brought into the hospital and brought to the Cath  Lab by Dr. Daleen Snook, was found to have left ventricle with inferior apical  hypokinesis, widely patent proximal LAD stent, PDA with 85% stenosis of a  large distal vessel and an RV which was occluded and fills from the left.  He had PCI with 3 times 28 Xience drug-eluting stent to the distal RCA  with good results. He was returned to the telemetry floor where he  proceeded to recover without any postprocedure complications. A radial  access was used. On discharge, the patient was ambulatory and moving  about without any chest pain, shortness of breath and no hematoma or  bruit to the right radial site. He was seen and evaluated by Dr. Daleen Snook and  determined stable and ready for discharge. Emphasis of remaining on  Effient and aspirin on discharge. The patient determined stable and  ready for discharge by Dr. Daleen Snook.    PRIMARY CARE PROVIDER: Dr. ________.    CONSULTS: Nonsignificant.    DIAGNOSTIC STUDIES: Cardiac catheterization with PTCA as described  above.     DISCHARGE EXAMINATION: Please refer to physical examination and progress  notes.    DISPOSITION: The patient will be discharged home at 6 p.m. this evening  in stable condition.    DISCHARGE MEDICATIONS: The patient will continue on all of his  preadmission medications. Please refer to medication reconciliation and  connect care activity.    DISCHARGE INSTRUCTIONS: The patient was given discharge instructions in  reference to his right radial access site for his cardiac  catheterization.    DIET: Low-fat, low-salt, low-cholesterol diet.    EXERCISE: Emphasized as well.    FOLLOWUP: He will followup with Dr. Merwyn Katos on February 24, 2010 at 10 a.m. and followup with Dr. ________ in 3 to 4 weeks or as  previously scheduled.            Dictated By: Caswell Corwin, PA            Seabron Spates, MD     This is an unverified document unless signed by physician.    TID: wmx DT: 02/02/2010 3:14 P  JOB: [id number] DOC#: [id number] DD: 02/02/2010    cc: Seabron Spates, MD   Servando Snare, MD

## 2010-02-02 NOTE — H&P (Signed)
See dictated H&P # [id number].

## 2010-02-02 NOTE — H&P (Signed)
Patient seen and examined with physician extender. Agree with assessment and plans as outlined.    He has not felt well and has had angina this am post nuke stress yesterday.  Images with inferior posterior defect as well as septal.   Recent PCI to LAD with high grade distal RCA.   I have recommended recath and possible PCI later today.

## 2010-02-02 NOTE — ED Notes (Signed)
Bedside report to denise rn

## 2010-02-02 NOTE — Discharge Summary (Signed)
See dictated discharge summary # [id number].

## 2010-02-02 NOTE — Progress Notes (Signed)
R radial TR band stable no bleeding or hematoma. Pt to be discharged. Judeth Cornfield, RN to discharge patient shortly.

## 2010-02-02 NOTE — Progress Notes (Signed)
Attempted to see PT who had been admitted via ED. PT had already been discharged.  Rev. Donnie Coffin, M.Div  Chaplain  [employer]

## 2010-02-02 NOTE — H&P (Signed)
[facility]   [address]   [place], [place]. [address]   [phone]     HISTORY AND PHYSICAL    NAME: [name] [name] MR: [id number]  LOC: ER SEX: Gillermina Hu: [id number]  DOB: 1955/06/06 AGE: 54 PT: E  ADMIT: 02/02/2010 DSCH: MSV: EMR      PRIMARY MEDICAL DOCTOR: Unknown    PRIMARY CARDIOLOGIST: Servando Snare, MD    ADMITTING PHYSICIAN: Seabron Spates, MD    REASON FOR ADMISSION: Chest discomfort concerning for unstable angina.    HISTORY OF PRESENT ILLNESS: A 54 year old Caucasian gentleman, who is  status post acute anterior myocardial infarction back in August of this  year with subsequent cardiac catheterization at that time with the  culprit lesion being the proximal LAD. He underwent successful PCI and  stent deployment using a 3.0 x 28 mm Xience drug-eluting stent. He also  has severe 2-vessel disease with totally occluded mid circumflex coronary  artery. Also found was an occluded acute marginal branch of the RCA by  collateral circulation from the left coronary system and severe disease  in the distal right coronary artery with mild left ventricular systolic  dysfunction at that time. He was placed on medical therapy and PCI of his  distal RCA and circumflex marginal were to be considered should his  symptoms persist. He was discharged on Effient and aspirin, which he  states he has been compliant with. He presented to the emergency  department this morning. He states he woke up at approximately 4:30 a.m.  with a "racing heart." He denied any chest pain or tightness, but states  that his chest felt "uncomfortable." The patient states he took  sublingual nitroglycerin x2 with relief. He also experienced some nausea,  but denied any diaphoresis, shortness of breath, vomiting, dizziness, or  syncope. The patient had an outpatient treadmill stress test, nuclear  stress test, yesterday and was found to have a fixed inferior septal and  apical scar with a calculated ejection fraction of 38%. Last   echocardiogram on October 4 of this year with an ejection fraction 40% to  45%.    OTHER REVIEW OF SYSTEMS: The patient denies any chills, fever, headache,  visual disturbances, hair, skin or nail changes, easy bruising or  bleeding, sore throat, epistaxis, cough, hemoptysis, orthopnea, dyspnea  on exertion or paroxysmal nocturnal dyspnea. He denies any abdominal  pain, dysphagia, anorexia, diarrhea, constipation, or melena. Denies  dysuria, hematuria, urinary frequency, urgency, or retention. Denies any  paresthesias, weakness, ataxia, or slurred speech.    ALLERGIES: NO KNOWN DRUG ALLERGIES.    MEDICATIONS  1. Aspirin 325 mg daily.  2. Lipitor 80 mg daily.  3. Coreg 12.5 mg b.i.d.  4. Co-enzyme Q 10, 100 mg capsule daily.  5. Cozaar 25 mg daily.  6. Nitroglycerin 0.4 mg sublingual.  7. Omega 3 fish oil daily.  8. Effient 10 mg daily.    PRIOR MEDICAL HISTORY: Hypertension, coronary artery disease,  dyslipidemia, status post anterior myocardial infarction, status post  PTCA.    PRIOR SURGICAL HISTORY: Cardiac catheterization prior.    SOCIAL HISTORY: The patient is married, lives with his wife. He does not  smoke or drink alcohol. He is a Optician, dispensing.    FAMILY MEDICAL HISTORY: Remarkable for his father having coronary artery  disease.    EMERGENCY DEPARTMENT DIAGNOSTICS AND LABORATORY: Cardiac biomarkers:  CK-MB less than 1.0, myoglobin 42.9, troponin I less than 0.05. BNP 40.8.  EKG showed normal sinus  rhythm without any acute findings. WBC 6.4,  hemoglobin 13.6, hematocrit 41.3, platelets 177. Sodium 138, potassium  3.9, glucose 112, BUN 25, creatinine 1.0.    PHYSICAL EXAMINATION  VITAL SIGNS: 98.2, heart rate between 64 and 58 beats per minute, blood  pressure 118/76, respirations 18, SaO2 is 95% to 93% on room air.  GENERAL: A moderately obese gentleman in no acute distress. He is alert  and oriented x3.  SKIN: Warm, dry, and intact.  NECK: Supple without adenopathy, meningismus, thyromegaly, JVD, or   carotid bruits.  HEENT: Head is normocephalic, atraumatic. Eyes: Pupils equal, round, and  reactive to light and accommodation. Extraocular muscles intact.  Conjunctivae and sclerae are clear without injection, exudate, or  icterus. Ears, nose and throat unremarkable with moist mucous membranes.  LUNGS: Clear to auscultation bilaterally with good respiratory excursion.  No rales, rhonchi, wheezes, stridor, or rubs.  CARDIAC: Regular rate and rhythm. S1, S2. No murmurs, rubs, gallops, or  clicks.  ABDOMEN: Soft, nontender, nondistended. No masses, rebound, or guarding.  No hepatosplenomegaly, CVA tenderness, bruits, or pulsatile masses. Bowel  sounds are present and active x4. MUSCULOSKELETAL/NEUROLOGIC: Cranial  nerves 2 through 12 are grossly intact. No obvious strength or sensory  deficits upper or lower extremities bilaterally. Lower extremities  without edema or tenderness.    ASSESSMENT/PLAN  1. Chest pain concerning for unstable angina. Admit to Telemetry. Plan  cardiac catheterization today. Rule out acute coronary syndrome with  serial EKGs and cardiac biomarkers. The patient will continue his aspirin  and Effient, as well as statin, beta blocker, ACE inhibitor, and  nitroglycerin paste as ordered.  2. Chronic systolic heart failure, currently well compensated. We will  continue medical regimen.  3. Dyslipidemia. Continue statin.  4. Hypertension, currently stable. Continue to monitor and adjust as  indicated.    Further recommendations, intervention pending results of above diagnostic  exams and the patient's response to treatment.        Dictated By: Caswell Corwin, PA            Seabron Spates, MD     This is an unverified document unless signed by physician.    TID: [id number] DT: 02/02/2010 9:37 A  JOB: [id number] DOC#: [id number] DD: 02/02/2010     cc: Seabron Spates, MD

## 2010-02-02 NOTE — ED Notes (Signed)
States he had a treadmill stress test done yesterday and has not felt right since.  Awoke at 430 this AM with mild chest pain and took nitroglycerin SL.  Denies pain currently.  Also c/o nausea en route.  Keeps stating " I just don't feel quite right."

## 2010-02-02 NOTE — ED Notes (Signed)
Dr Payne at bedside

## 2010-02-02 NOTE — Progress Notes (Signed)
R TR band removed, no bleeding or hematoma noted. Gauze and tegaderm applied. Pt instructed on limitations with wrist.

## 2010-02-02 NOTE — Progress Notes (Signed)
R wrist TR band intact, no bleeding or hematoma noted. No distress noted. Vitals stable, pt instructed on limitations with R wrist.

## 2010-02-02 NOTE — ED Notes (Signed)
Pt resting quietly with eyes closed. Respirations even and nonlabored. Easily arousable to verbal. Upon arousal states continues to have "slight discomfort". Vital signs stable on monitor.

## 2010-02-02 NOTE — Undefined (Incomplete)
Right wrist no bleeding noted pt feels good and awaiting discharge instructions.  Discharge instructions given to pt and wife and both verbalized understanding.  Instructed to pt what to do if the right w

## 2010-02-02 NOTE — Progress Notes (Signed)
Brief Cardiac Catheterization Procedure Note    Patient: [name] [name] MRN: [id number]  SSN: [id number]    Date of Birth: 23-Jan-1956  Age: 54 y.o.  Sex: male      Date of Procedure: 02/02/2010     Pre-procedure Diagnosis: Chest Pain    Post-procedure Diagnosis: Coronary Artery Disease    Procedure: Left Heart Catheterization with Percutaneous Coronary Intervention    Performed By: Seabron Spates, MD     Anesthesia: Moderate Sedation    Estimated Blood Loss: Less than 10 mL      Specimens: None    Findings: LV with inferior apical hypokinesis.  Coronaries large and ectatic . [facility] patent .  LAD with proximal stent widely patent.  Small marginals with diffuse disease. RCA large with diffuse trunk irregularity ectasia. PDA with 85% stenosis large distal vessel.   RV occluded fills from left  PCI   3x28 Xience DES to distal RCA  85%-10% TIMI 3 pre and post.         Complications: None    Implants: None    Recommendations: Continue medical therapy. intensive risk modification exercise weight loss and statins.     Signed By: Seabron Spates, MD     February 02, 2010

## 2010-02-02 NOTE — Progress Notes (Signed)
TRANSFER - OUT REPORT:    Verbal report given to 3rd Floor Tele RN(name) on [name] [name]  being transferred to 3rd (unit) for routine progression of care       Report consisted of patient???s Situation, Background, Assessment and   Recommendations(SBAR).     Information from the following report(s) SBAR and Procedure Summary was reviewed with the receiving nurse.    Opportunity for questions and clarification was provided.      Pt had LHC with intervention via R wrist.  All VSS with meds noted in the [facility].  Pt has TR band in place with 13 cc if air with no hematoma or oozing noted.    Pt received 10 mg effient post cath with asa given pre cath.    Past Medical History   Diagnosis Date   ??? Hypertension    ??? CAD (coronary artery disease)    ??? Unstable angina 02/02/2010   ??? Dyslipidemia

## 2010-02-02 NOTE — Progress Notes (Signed)
TRANSFER - IN REPORT:    Verbal report received from John RN(name) on Buckner Malta  being received from CCL(unit) for routine progression of care      Report consisted of patient???s Situation, Background, Assessment and   Recommendations(SBAR).     Information from the following report(s) Procedure Summary was reviewed with the receiving nurse.    Opportunity for questions and clarification was provided.      Assessment completed upon patient???s arrival to unit and care assumed.

## 2010-02-02 NOTE — Procedures (Signed)
[facility]   [address]   [place], [place]. [address]   [phone]     [facility] - CATH LAB REPORT    NAME: Dyshawn, Cangelosi MR: [id number]  LOC: [id number] SEX: M ACCT: [id number]  DOB: 1956-03-05 AGE: 54 PT: I  ADMIT: 02/02/2010 DSCH: MSV: MED      DATE OF PROCEDURE: 02/02/2010    PROCEDURES:  1. Left heart catheterization.  2. Coronary arteriograms.  3. Left ventriculogram.  4. Percutaneous coronary intervention.    CLINICAL: This is a gentleman with known coronary disease, previous  total occlusion of the LAD, percutaneous coronary intervention and  stenting. He has not felt well. He underwent a recent nuclear medicine  stress test that showed a reduced EF of 35% with inferior posterior  defect that appeared primarily fixed, as well as a septal defect that  appeared primarily fixed. He returns to the emergency room with chest  discomfort, pressure, tightness, as well as tachy palpitations. The  initial EKG and enzymes are negative. I elected to perform a repeat  cardiac catheterization in light of his known coronary disease and  increasing symptoms. This was performed with a right radial approach.  Right radial artery was cannulated using modified Seldinger approach.  Catheterization performed using a 6-French Terumo sheath, JL 3.5, JR 5  catheters. The coronary addition was completed successfully using an AL-1  guide, a BMW wire, BHW wire, and a Cougar wire and a buddy wire tandem  to stent the distal right coronary artery. A 30 x 28 XIENCE drug-eluting  stent was implanted and post dilated to high pressure with noncompliant  TREK balloon.    He tolerated the procedure well. At the conclusion, the radial artery  sheath was withdrawn. Local hemostasis obtained using Terumo radial band.      Anticoagulation with heparin. ACD guidance.    CORONARY ARTERIOGRAMS:  1. Left main coronary was normal. It gave rise to LAD and circumflex   coronaries as well as two small caliber proximal diagonal vessels.  2. LAD: There was a fairly dramatic tapering from the left main to the  coronaries suggesting some ectasia in the left main. The LAD had evidence  of previous proximal stenting. The stented segment appeared very  satisfactory with good inflow and outflow appearance. There was flow to a  small caliber diagonal that arose just from the distal end of the stent.  The LAD flow also was good to the cardiac apex with minor irregularity in  the vessel.  3. The circumflex coronary contained proximal ectasia. There is 50% to  60% narrowing in the mid vessel marginal and distally the circumflex was  quite small and appeared to be subtotally occluded with a marginal  filling late retrograde.  4. The there were 2 high diagonals versus ramus vessels that were  tortuous and contained a 50% proximal stenoses.  5. There was some evidence of left to right filling of what appeared to  be a distal RV marginal from the right coronary.  6. The right coronary was selectively injected and was a large ectatic  vessel as well and contained diffuse irregularity and tortuosity  proximally and through the midportion. The caliber of the trunk of the  right coronary was good throughout up until the terminal branches of the  right coronary artery. There was a dual posterior descending type pattern  with the more distal branch being the largest of the two. There was 60%  stenosis prior to the proximal branch with proximal branch being  diffusely diseased and 85% stenosis prior to the distal larger caliber  posterior descending. This extended fairly distal and was free of  significant disease in the runoff vessel.    It was elected to proceed to percutaneous intervention to the very high  grade stenosis of the distal right coronary artery. Initially, a JR 4  guide was used, but this did not provide satisfactory backup. We then   switched to the aforementioned AL1, a BHW wire with Cougar wire were  deployed distally and the lesion was predilated with 2.5 x 15 TREK  balloon and then the the stent was able to be deployed distally after the  buddy wire system. Postdilatation was 3-0 high pressure at 18  atmospheres. Following angiograms revealed a stable appearance to the  right coronary trunk and resolution of the aforementioned high-grade  stenosis distally in the right coronary artery with less than 10%  residual stenosis. There was TIMI-3 flow into both the terminal PDA as  well as the jailed smaller proximal PDA.    IMPRESSION:  1. Diffuse coronary disease as described.  2. Inferior apical hypokinesis of left ventricle with an ejection  fraction of 45%.  3. Continued satisfactory appearance of proximal LAD stenting.  4. Totally occluded distal circumflex.  5. Successful PTCA and stenting of very high-grade stenosis involving a  large distal posterior descending branch.                Seabron Spates, MD     This is an unverified document unless signed by physician.    TID: [id number] DT: 02/02/2010 3:24 P  JOB: [id number] DOC#: [id number] DD: 02/02/2010    cc: Seabron Spates, MD

## 2010-02-02 NOTE — ED Provider Notes (Signed)
HPI Comments: [name] [name] is a 54 y.o. Caucasian male in NAD presents with c/o feeling weak and tired last PM and then awaking this AM with a rapid ht rate and chest tightness. Pt is symptom free at present. Pt states he under went a stress test yesterday but does not know the results.      Patient is a 54 y.o. male presenting with chest pain. The history is provided by the patient.   Chest Pain (Angina)   This is a new problem. The current episode started 6 to 12 hours ago. The problem has been resolved. The problem occurs rarely. The pain is associated with normal activity. The pain is present in the substernal region. The pain is at a severity of 2/10. The quality of the pain is described as tightness. The pain does not radiate. Associated symptoms include malaise/fatigue and palpitations. Pertinent negatives include no diaphoresis, no fever, no numbness, no claudication, no exertional chest pressure, no irregular heartbeat, no near-syncope, no orthopnea, no PND, no abdominal pain, no nausea, no vomiting, no headaches, no back pain, no leg pain, no lower extremity edema, no dizziness, no weakness, no cough, no hemoptysis, no shortness of breath and no sputum production. The treatment provided moderate relief. Risk factors include male gender, cardiac disease, obesity and hypertension. His past medical history is significant for HTN.Procedural history includes cardiac catheterization and cardiac stents.       Past Medical History   Diagnosis Date   ??? Hypertension    ??? CAD (coronary artery disease)           Past Surgical History   Procedure Date   ??? Cardiac surg procedure unlist 11/2009     stent x1 LAD            No family history on file.     History   Social History   ??? Marital Status: Married     Spouse Name: N/A     Number of Children: N/A   ??? Years of Education: N/A   Occupational History   ??? Not on file.   Social History Main Topics   ??? Smoking status: Never Smoker    ??? Smokeless tobacco: Not on file    ??? Alcohol Use: No   ??? Drug Use:    ??? Sexually Active:    Other Topics Concern   ??? Not on file   Social History Narrative   ??? No narrative on file                    ALLERGIES: Review of patient's allergies indicates no known allergies.      Review of Systems   Constitutional: Positive for malaise/fatigue. Negative for fever and diaphoresis.   Respiratory: Negative for cough, hemoptysis, sputum production and shortness of breath.    Cardiovascular: Positive for chest pain and palpitations. Negative for orthopnea, claudication, PND and near-syncope.   Gastrointestinal: Negative for nausea, vomiting and abdominal pain.   Musculoskeletal: Negative for back pain.   Neurological: Negative for dizziness, weakness, numbness and headaches.   All other systems reviewed and are negative.        Filed Vitals:    02/02/10 0552   BP: 129/82   Pulse: 65   Temp: 98.2 ??F (36.8 ??C)   Resp: 18   Height: 6\' 4"  (1.93 m)   Weight: 290 lb (131.543 kg)   SpO2: 93%  Physical Exam   Nursing note and vitals reviewed.  Constitutional: He is oriented to person, place, and time. Vital signs are normal. He appears well-developed and well-nourished. He is cooperative.  Non-toxic appearance. He does not have a sickly appearance. He does not appear ill. No distress.   HENT:   Head: Normocephalic and atraumatic.   Right Ear: External ear normal.   Left Ear: External ear normal.   Nose: Nose normal.   Mouth/Throat: Oropharynx is clear and moist.   Eyes: Conjunctivae and EOM are normal. No scleral icterus.   Neck: Normal range of motion. Neck supple.   Cardiovascular: Normal rate, regular rhythm, normal heart sounds, intact distal pulses and normal pulses.  Exam reveals no gallop.    No murmur heard.  Pulmonary/Chest: Effort normal and breath sounds normal. No respiratory distress. He has no wheezes. He has no rales. He exhibits no tenderness.   Musculoskeletal: Normal range of motion. He exhibits no tenderness.    Neurological: He is alert and oriented to person, place, and time.   Skin: Skin is warm, dry and intact. No rash noted. No pallor.   Psychiatric: He has a normal mood and affect. His speech is normal and behavior is normal. Judgment and thought content normal. Cognition and memory are normal.        MDM    Procedures    EKG is neg for acute changes    Discussed the pt with Dr. Brantley Fling who will see the pt in the ED.

## 2010-02-02 NOTE — ED Notes (Signed)
TRANSFER - OUT REPORT:    Verbal report given to [facility] RN on Buckner Malta  being transferred to Cath lab for routine progression of care       Report consisted of patient???s Situation, Background, Assessment and   Recommendations(SBAR).     Information from the following report(s) ED Summary, MAR and Recent Results was reviewed with the receiving nurse.    Opportunity for questions and clarification was provided.

## 2010-02-02 NOTE — ED Notes (Signed)
Consent obtained for heart catheterization

## 2010-02-02 NOTE — Discharge Summary (Signed)
Reviewed discharge with patient and extender. I agree with discharge plans and follow up.     [name] [name] E Kahlel Peake, MD

## 2010-02-02 NOTE — ED Notes (Signed)
Patient resting quietly without any acute distress noted.

## 2010-02-02 NOTE — Progress Notes (Signed)
Pt back from cath lab. Monitor placed. I/ to limit rt wrist movement. Wife at bedside

## 2010-02-03 LAB — POC CARDIAC MARKERS W BNP
BNP: 40 pg/mL (ref 5–100)
CK-MB: <1 ng/mL (ref <4.3)
Myoglobin: 42 ng/mL (ref <107)
Troponin-I: <0.05 ng/mL (ref <0.05)

## 2010-02-13 NOTE — ED Provider Notes (Addendum)
Patient is a 54 y.o. male presenting with chest pain and palpitations. The history is provided by the patient.   Chest Pain (Angina)   The current episode started 12 to 24 hours ago. The problem has not changed since onset. The problem occurs constantly. The quality of the pain is described as intermittent and pressure-like. The pain does not radiate. Associated symptoms include palpitations.   Palpitations   Associated symptoms include chest pain.        Past Medical History   Diagnosis Date   ??? Hypertension    ??? CAD (coronary artery disease)    ??? Unstable angina 02/02/2010   ??? Dyslipidemia           Past Surgical History   Procedure Date   ??? Cardiac surg procedure unlist 11/2009     stent x1 LAD    ??? Left heart cath,percutaneous 02/02/2010     stent x1           No family history on file.     History   Social History   ??? Marital Status: Married     Spouse Name: N/A     Number of Children: N/A   ??? Years of Education: N/A   Occupational History   ??? Not on file.   Social History Main Topics   ??? Smoking status: Never Smoker    ??? Smokeless tobacco: Not on file   ??? Alcohol Use: No   ??? Drug Use:    ??? Sexually Active:    Other Topics Concern   ??? Not on file   Social History Narrative   ??? No narrative on file                    ALLERGIES: Review of patient's allergies indicates no known allergies.      Review of Systems   Constitutional: Negative.  Negative for activity change.   HENT: Negative.    Eyes: Negative.    Respiratory: Negative.    Cardiovascular: Positive for chest pain and palpitations.   Gastrointestinal: Negative.    Genitourinary: Negative.    Musculoskeletal: Negative.    Skin: Negative.    Neurological: Negative.    Hematological: Negative.    Psychiatric/Behavioral: Negative.    All other systems reviewed and are negative.        Filed Vitals:    02/02/10 0900 02/02/10 1011 02/02/10 1305 02/02/10 1648   BP: 147/96  145/97 136/86   Pulse: 64  60 64   Temp:   97.8 ??F (36.6 ??C) 97.8 ??F (36.6 ??C)    Resp: 17 20 20 18    Height:       Weight:       SpO2: 97% 100% 100% 100%              Physical Exam   Nursing note and vitals reviewed.  Constitutional: He is oriented to person, place, and time. He appears well-developed and well-nourished.   HENT:   Head: Normocephalic and atraumatic.   Right Ear: External ear normal.   Left Ear: External ear normal.   Eyes: Conjunctivae and EOM are normal. Pupils are equal, round, and reactive to light.   Neck: Normal range of motion. Neck supple.   Cardiovascular: Normal rate, regular rhythm and intact distal pulses.    Pulmonary/Chest: Effort normal and breath sounds normal.   Abdominal: Soft. Bowel sounds are normal.   Musculoskeletal: Normal range of motion.   Neurological: He is alert  and oriented to person, place, and time. No cranial nerve deficit.   Skin: Skin is warm and dry.   Psychiatric: He has a normal mood and affect.        MDM    Procedures

## 2010-07-28 LAB — CBC
HCT: 36.2 % — ABNORMAL LOW (ref 39.0–52.0)
MCHC: 34 g/dL (ref 30.0–36.0)
MCHC: 34.5 g/dL (ref 30.0–36.0)
MCV: 91.3 fL (ref 78.0–100.0)
MCV: 92.8 fL (ref 78.0–100.0)
MCV: 92.9 fL (ref 78.0–100.0)
Platelets: 217 10*3/uL (ref 150–400)
RBC: 3.95 MIL/uL — ABNORMAL LOW (ref 4.22–5.81)
RBC: 3.96 MIL/uL — ABNORMAL LOW (ref 4.22–5.81)
RDW: 13.3 % (ref 11.5–15.5)
WBC: 11.4 10*3/uL — ABNORMAL HIGH (ref 4.0–10.5)

## 2010-07-28 LAB — URINALYSIS, ROUTINE W REFLEX MICROSCOPIC
Glucose, UA: NEGATIVE mg/dL
Protein, ur: NEGATIVE mg/dL
Specific Gravity, Urine: 1.012 (ref 1.005–1.030)
Urobilinogen, UA: 0.2 mg/dL (ref 0.0–1.0)

## 2010-07-28 LAB — URINE CULTURE: Culture: NO GROWTH

## 2010-07-28 LAB — DIFFERENTIAL
Eosinophils Relative: 2 % (ref 0–5)
Lymphocytes Relative: 28 % (ref 12–46)
Lymphs Abs: 1.6 10*3/uL (ref 0.7–4.0)
Monocytes Relative: 6 % (ref 3–12)

## 2010-07-28 LAB — COMPREHENSIVE METABOLIC PANEL
AST: 23 U/L (ref 0–37)
Albumin: 4.2 g/dL (ref 3.5–5.2)
CO2: 30 mEq/L (ref 19–32)
Calcium: 9.5 mg/dL (ref 8.4–10.5)
Creatinine, Ser: 1.26 mg/dL (ref 0.4–1.5)
GFR calc Af Amer: >60 mL/min (ref >60)
GFR calc non Af Amer: >60 mL/min (ref >60)
Sodium: 138 mEq/L (ref 135–145)
Total Protein: 6.9 g/dL (ref 6.0–8.3)

## 2010-07-28 LAB — CROSSMATCH

## 2010-07-28 LAB — ABO/RH: ABO/RH(D): A POS

## 2010-07-28 LAB — BASIC METABOLIC PANEL
BUN: 8 mg/dL (ref 6–23)
CO2: 28 mEq/L (ref 19–32)
Chloride: 101 mEq/L (ref 96–112)
Chloride: 103 mEq/L (ref 96–112)
Creatinine, Ser: 1.09 mg/dL (ref 0.4–1.5)
GFR calc Af Amer: >60 mL/min (ref >60)
Potassium: 3.7 mEq/L (ref 3.5–5.1)

## 2010-07-28 LAB — PROTIME-INR: Prothrombin Time: 13.3 seconds (ref 11.6–15.2)

## 2010-08-30 NOTE — Op Note (Signed)
NAME:  Stephen Sosa, Stephen Sosa NO.:  [id number]   MEDICAL RECORD NO.:  [id number]          PATIENT TYPE:  INP   LOCATION:  2899                         FACILITY:  [facility]   PHYSICIAN:  Mila Homer. Sherlean Foot, M.D. DATE OF BIRTH:  04/07/1956   DATE OF PROCEDURE:  07/06/2008  DATE OF DISCHARGE:                               OPERATIVE REPORT   SURGEON:  Mila Homer. Sherlean Foot, MD   ASSISTANTS:  1. Altamese Cabal, PA-C  2. Skip Mayer, PA-C   ANESTHESIA:  General.   PREOPERATIVE DIAGNOSIS:  Right knee osteoarthritis.   POSTOPERATIVE DIAGNOSIS:  Right knee osteoarthritis.   PROCEDURE:  Right total knee arthroplasty.   INDICATIONS FOR PROCEDURE:  The patient is a 54 year old white male with  failure to conservative measures for osteoarthritis of the right knee.  Informed consent was obtained.   DESCRIPTION OF PROCEDURE:  The patient was laid supine and administered  general anesthesia.  The right knee was prepped and draped in the usual  sterile fashion.  The extremity was exsanguinated with Esmarch  tourniquet and inflated to 350 mmHg and set for an hour.  A #10 blade  was used to make a medial parapatellar arthrotomy and performed  synovectomy.  I then used a new blade to make a medial parapatellar  arthrotomy and performed synovectomy.  I elevated the deep MCL off the  medial crest of the tibia.  I then used the extramedullary alignment  system on the tibia to make a perpendicular cut to the anatomic axis.  I  used the intramedullary guide on the femur to make a 6-degree valgus  cut.  I then marked out the epicondylar axis and posterior condylar  angle measured 3 degrees.  I then sized to a size E, pinned through 3  degrees of external rotation holes.  I made the epicondylar anterior,  posterior, and chamfer cuts with sagittal saw through the 4-in-1 cutting  block.  I then placed a lamina spreader on the knee, removed the medial  and lateral menisci, posterior condylar  osteophytes, ACL, and PCL.  I  then obtained flexion/extension gap balance with a 14-mm spacer block.  I then finished the femur with a size E finishing block, finished the  tibia with a size 5 tibial tray drilling keel.  I then trialed a 5 tibia  E femur, prepared the patella with a 32 patella, and had good  flexion/extension gap balance and good patellar tracking.  I then  removed the trial components and copiously irrigated it.  I then  cemented in the components and removed excess cement and let the cement  harden in extension.  I placed a Hemovac coming out superolaterally deep  to the arthrotomy, pain catheter coming out supermedial and superficial  to the arthrotomy.  Closed the arthrotomy with figure-of-eight #1 Vicryl  sutures and subcuticular 0-Vicryl stitch and skin staples.  I let the  tourniquet down at 52 minutes.   COMPLICATIONS:  None.   DRAINS:  One Hemovac and one pain catheter.   ESTIMATED BLOOD LOSS:  300 mL.  ______________________________  Mila Homer Sherlean Foot, M.D.     SDL/MEDQ  D:  07/06/2008  T:  07/06/2008  Job:  [id number]

## 2010-09-02 NOTE — Discharge Summary (Signed)
NAME:  Stephen Sosa, Stephen Sosa NO.:  [id number]   MEDICAL RECORD NO.:  [id number]          PATIENT TYPE:  INP   LOCATION:  5001                         FACILITY:  [facility]   PHYSICIAN:  Mila Homer. Sherlean Foot, M.D. DATE OF BIRTH:  Dec 04, 1955   DATE OF ADMISSION:  07/06/2008  DATE OF DISCHARGE:  07/08/2008                               DISCHARGE SUMMARY   ADMISSION DIAGNOSIS:  Right knee osteoarthritis.   DISCHARGE DIAGNOSES:  1. Osteoarthritis of the right knee.  2. Status post right total knee arthroplasty.  3. Acute blood loss anemia, status post surgery.   PROCEDURE:  Right total knee arthroplasty.   HISTORY:  The patient is a 55 year old male who complained of pain in  the right knee for times several years.  He states the pain is  intermittent, throbbing and interferes with activities of daily living.  The patient has failed conservative methods.  Risk and benefits of  surgery were discussed with the patient and the patient would like to  proceed with a right TKA.   ALLERGIES:  The patient has no known drug allergies.   Before surgery, the patient was taken ibuprofen and Tylenol p.r.n.  No  other medications.   HOSPITAL COURSE:  This is a 55 year old male admitted on July 06, 2008.  After appropriate laboratory studies were obtained preoperatively as  well as Ancef on call to the operating room, he was taken to the OR  where he underwent a right TKA.  The patient tolerated the procedure  well and was taken to the PACU in good condition.  The patient was  placed on p.o. pain medication.  Foley catheter was placed  intraoperatively.   Postop day #1, vital signs were stable.  The patient denied chest pain,  shortness of breath, or calf pain.  The patient was started on Lovenox  30 mg subcu q.12 h. at 8 a.m.  Consults PT, OT, and care management were  made.  The patient is weightbearing as tolerated.  He is in CPM 0-90  degrees for 6-8 hours per day.  Incentive  spirometry was taught.   Postop day #2, the patient continued to progress with physical therapy.  Dressing was changed.  Marcaine pump and Hemovac were discontinued and  Foley was discontinued.  The patient was continued on p.o. pain  medications.  The patient was discharged home on Lovenox teaching.   LABORATORY STUDIES:  Upon admission to the hospital, the patient's white  blood cell count was 5.7, H and H was 17.3 and 51.0 platelets were 217.  Sodium was 138, potassium was 4.8, chloride was 102, CO2 was 30, glucose  was 112, BUN was 17, creatinine was 1.26.  Upon discharge, the patient's  white blood cell count was 8.7, H and H was 12.6 and 36.7, platelets  were 170.  Sodium was 135, potassium was 3.5, chloride was 103, CO2 was  28, glucose was 93, BUN was 8 and creatinine was 1.09.   DISCHARGE INSTRUCTIONS:  There are no restrictions to diet.  Follow the  blue instruction sheet for wound care.  Increase activity slowly.  May  use a cane or walker, weightbearing as tolerated.  No lifting or driving  for 6 weeks.  Home health was established.  CPM 0-90 degrees 6-8 hours  per day x2 weeks.  Bilateral compression stockings x3 weeks.   DISCHARGE MEDICATIONS:  On discharge, the patient was given  prescriptions for Lovenox 40 mg, inject once subcu daily, last dose  July 20, 2008, Robaxin 500 mg 1-2 tablets every 6 hours as needed for  spasm and oxycodone 5 mg 1-2 tablets every 4-6 hours as needed for pain.   The patient will follow with Dr. Sherlean Foot on July 21, 2008, call for an  appointment at [phone].  Discharged in improved condition.     ______________________________  Altamese Cabal, PA-C    ______________________________  Mila Homer. Sherlean Foot, M.D.    MJ/MEDQ  D:  08/14/2008  T:  08/15/2008  Job:  [id number]

## 2010-09-20 LAB — EKG, 12 LEAD, INITIAL
Atrial Rate: 36 {beats}/min
Calculated P Axis: 51 degrees
Calculated R Axis: 34 degrees
Calculated T Axis: 87 degrees
P-R Interval: 162 ms
Q-T Interval: 418 ms
QRS Duration: 108 ms
QTC Calculation (Bezet): 410 ms
Ventricular Rate: 58 {beats}/min

## 2010-09-20 LAB — METABOLIC PANEL, COMPREHENSIVE
A-G Ratio: 1.1 — ABNORMAL LOW (ref 1.2–3.5)
ALT (SGPT): 46 U/L (ref 39–65)
AST (SGOT): 18 U/L (ref 15–37)
Albumin: 3.9 g/dL (ref 3.5–5.0)
Alk. phosphatase: 114 U/L (ref 50–136)
Anion gap: 4 mmol/L — ABNORMAL LOW (ref 7–16)
BUN: 15 MG/DL (ref 6–23)
Bilirubin, total: 0.6 MG/DL (ref 0.2–1.1)
CO2: 31 MMOL/L (ref 23–32)
Calcium: 9 MG/DL (ref 8.3–10.4)
Chloride: 106 MMOL/L (ref 98–107)
Creatinine: 1.1 MG/DL (ref 0.8–1.5)
GFR est AA: >60 mL/min/{1.73_m2} (ref >60)
GFR est non-AA: >60 mL/min/{1.73_m2} (ref >60)
Globulin: 3.5 g/dL (ref 2.3–3.5)
Glucose: 110 MG/DL — ABNORMAL HIGH (ref 65–100)
Potassium: 4.4 MMOL/L (ref 3.5–5.1)
Protein, total: 7.4 g/dL (ref 6.3–8.2)
Sodium: 141 MMOL/L (ref 136–145)

## 2010-09-20 LAB — CBC WITH AUTOMATED DIFF
ABS. BASOPHILS: 0 10*3/uL (ref 0.0–0.2)
ABS. EOSINOPHILS: 0.5 10*3/uL (ref 0.0–0.8)
ABS. IMM. GRANS.: 0 10*3/uL (ref 0.0–0.5)
ABS. LYMPHOCYTES: 1.4 10*3/uL (ref 0.5–4.6)
ABS. MONOCYTES: 0.4 10*3/uL (ref 0.1–1.3)
ABS. NEUTROPHILS: 3.4 10*3/uL (ref 1.7–8.2)
BASOPHILS: 1 % (ref 0.0–2.0)
EOSINOPHILS: 8 % — ABNORMAL HIGH (ref 0.5–7.8)
HCT: 42.2 % (ref 41.1–50.3)
HGB: 14.3 g/dL (ref 13.2–17.1)
IMMATURE GRANULOCYTES: 0.3 % (ref 0.0–5.0)
LYMPHOCYTES: 25 % (ref 13–44)
MCH: 28.1 PG (ref 26.1–32.9)
MCHC: 33.9 g/dL (ref 31.4–35.0)
MCV: 83.1 FL (ref 79.6–97.8)
MONOCYTES: 7 % (ref 4.0–12.0)
MPV: 10 FL — ABNORMAL LOW (ref 10.8–14.1)
NEUTROPHILS: 59 % (ref 43–78)
PLATELET: 173 10*3/uL (ref 150–450)
RBC: 5.08 M/uL (ref 4.23–5.67)
RDW: 14.1 % (ref 11.9–14.6)
WBC: 5.7 10*3/uL (ref 4.3–11.1)

## 2010-09-20 LAB — TROPONIN I
Troponin-I, Qt.: <0.04 NG/ML — ABNORMAL LOW (ref 0.04–0.05)
Troponin-I, Qt.: <0.04 NG/ML — ABNORMAL LOW (ref 0.04–0.05)

## 2010-09-20 LAB — CK: CK: 182 U/L (ref 21–215)

## 2010-09-20 LAB — MYOGLOBIN: Myoglobin: 48 ng/mL (ref 16–96)

## 2010-09-20 LAB — CK WITH MB
CK - MB: 0.7 ng/ml (ref 0.5–3.6)
CK-MB Index: 0.9 % (ref <2.5)
CK: 79 U/L (ref 21–215)

## 2010-09-20 MED ORDER — PRASUGREL 10 MG TAB
10 mg | Freq: Every day | ORAL | Status: DC
Start: 2010-09-20 — End: 2010-09-21

## 2010-09-20 MED ORDER — ASPIRIN 325 MG TAB
325 mg | Freq: Every day | ORAL | Status: DC
Start: 2010-09-20 — End: 2010-09-21

## 2010-09-20 MED ORDER — HYDROMORPHONE (PF) 1 MG/ML IJ SOLN
1 mg/mL | INTRAMUSCULAR | Status: DC | PRN
Start: 2010-09-20 — End: 2010-09-21

## 2010-09-20 MED ORDER — .PHARMACY TO SUBSTITUTE PER PROTOCOL
Status: DC
Start: 2010-09-20 — End: 2010-09-20

## 2010-09-20 MED ORDER — IOVERSOL 350 MG/ML IV SOLN
350 mg iodine/mL | Freq: Once | INTRAVENOUS | Status: AC
Start: 2010-09-20 — End: 2010-09-20
  Administered 2010-09-20: 22:00:00 via INTRA_ARTERIAL

## 2010-09-20 MED ORDER — LORAZEPAM 2 MG/ML IJ SOLN
2 mg/mL | Freq: Four times a day (QID) | INTRAMUSCULAR | Status: DC | PRN
Start: 2010-09-20 — End: 2010-09-21

## 2010-09-20 MED ORDER — ONDANSETRON (PF) 4 MG/2 ML INJECTION
4 mg/2 mL | INTRAMUSCULAR | Status: DC | PRN
Start: 2010-09-20 — End: 2010-09-21

## 2010-09-20 MED ORDER — NITROGLYCERIN 0.2MG/ML SYRINGE
0.2 mg/mL | INTRAMUSCULAR | Status: AC
Start: 2010-09-20 — End: ?

## 2010-09-20 MED ORDER — SODIUM CHLORIDE 0.9 % IV
INTRAVENOUS | Status: DC
Start: 2010-09-20 — End: 2010-09-21
  Administered 2010-09-20: 23:00:00 via INTRAVENOUS

## 2010-09-20 MED ORDER — MIDAZOLAM 1 MG/ML IJ SOLN
1 mg/mL | Freq: Once | INTRAMUSCULAR | Status: DC | PRN
Start: 2010-09-20 — End: 2010-09-20
  Administered 2010-09-20: 22:00:00 via INTRAVENOUS

## 2010-09-20 MED ORDER — HEPARIN (PORCINE) IN NS (PF) 2,000 UNIT/1,000 ML IV
2000 unit/1,000 mL | INTRAVENOUS | Status: AC
Start: 2010-09-20 — End: ?

## 2010-09-20 MED ORDER — FENTANYL CITRATE (PF) 50 MCG/ML IJ SOLN
50 mcg/mL | INTRAMUSCULAR | Status: DC | PRN
Start: 2010-09-20 — End: 2010-09-20
  Administered 2010-09-20: 22:00:00 via INTRAVENOUS

## 2010-09-20 MED ORDER — NITROGLYCERIN 0.2MG/ML SYRINGE
0.2 mg/mL | Freq: Once | INTRAMUSCULAR | Status: AC
Start: 2010-09-20 — End: 2010-09-20
  Administered 2010-09-20: 22:00:00 via INTRA_ARTERIAL

## 2010-09-20 MED ORDER — MIDAZOLAM 1 MG/ML IJ SOLN
1 mg/mL | INTRAMUSCULAR | Status: AC
Start: 2010-09-20 — End: ?

## 2010-09-20 MED ORDER — ACETAMINOPHEN 325 MG TABLET
325 mg | ORAL | Status: DC | PRN
Start: 2010-09-20 — End: 2010-09-20

## 2010-09-20 MED ORDER — HEPARIN (PORCINE) 10,000 UNIT/ML IJ SOLN
10000 unit/mL | Freq: Once | INTRAMUSCULAR | Status: AC
Start: 2010-09-20 — End: 2010-09-20
  Administered 2010-09-20: 22:00:00 via INTRA_ARTERIAL

## 2010-09-20 MED ORDER — HEPARIN (PORCINE) IN NS (PF) 2,000 UNIT/1,000 ML IV
2000 unit/1,000 mL | INTRAVENOUS | Status: DC
Start: 2010-09-20 — End: 2010-09-20
  Administered 2010-09-20: 21:00:00 via INTRA_ARTERIAL

## 2010-09-20 MED ORDER — ATORVASTATIN 40 MG TAB
40 mg | Freq: Every evening | ORAL | Status: DC
Start: 2010-09-20 — End: 2010-09-21

## 2010-09-20 MED ORDER — MORPHINE 2 MG/ML INJECTION
2 mg/mL | INTRAMUSCULAR | Status: DC | PRN
Start: 2010-09-20 — End: 2010-09-21

## 2010-09-20 MED ORDER — VERAPAMIL 2.5 MG/ML IV
2.5 mg/mL | Freq: Once | INTRAVENOUS | Status: AC
Start: 2010-09-20 — End: 2010-09-20
  Administered 2010-09-20: 22:00:00 via INTRA_ARTERIAL

## 2010-09-20 MED ORDER — HYDROCODONE-ACETAMINOPHEN 5 MG-325 MG TAB
5-325 mg | ORAL | Status: DC | PRN
Start: 2010-09-20 — End: 2010-09-21
  Administered 2010-09-20: 20:00:00 via ORAL

## 2010-09-20 MED ORDER — SALINE PERIPHERAL FLUSH Q8H
Freq: Three times a day (TID) | INTRAMUSCULAR | Status: DC
Start: 2010-09-20 — End: 2010-09-21
  Administered 2010-09-20: 20:00:00

## 2010-09-20 MED ORDER — SALINE PERIPHERAL FLUSH PRN
INTRAMUSCULAR | Status: DC | PRN
Start: 2010-09-20 — End: 2010-09-21

## 2010-09-20 MED ORDER — ACETAMINOPHEN 325 MG TABLET
325 mg | ORAL | Status: DC | PRN
Start: 2010-09-20 — End: 2010-09-21

## 2010-09-20 MED ORDER — NITROGLYCERIN 0.4 MG SUBLINGUAL TAB
0.4 mg | SUBLINGUAL | Status: DC | PRN
Start: 2010-09-20 — End: 2010-09-21

## 2010-09-20 MED ORDER — VERAPAMIL 2.5 MG/ML IV
2.5 mg/mL | INTRAVENOUS | Status: AC
Start: 2010-09-20 — End: ?

## 2010-09-20 MED ORDER — HYDROCODONE-ACETAMINOPHEN 10 MG-325 MG TAB
10-325 mg | Freq: Once | ORAL | Status: AC | PRN
Start: 2010-09-20 — End: 2010-09-20
  Administered 2010-09-20: 17:00:00 via ORAL

## 2010-09-20 MED ORDER — IOVERSOL 350 MG/ML IV SOLN
350 mg iodine/mL | INTRAVENOUS | Status: AC
Start: 2010-09-20 — End: ?

## 2010-09-20 MED ORDER — CARVEDILOL 12.5 MG TAB
12.5 mg | Freq: Two times a day (BID) | ORAL | Status: DC
Start: 2010-09-20 — End: 2010-09-21
  Administered 2010-09-20: via ORAL

## 2010-09-20 MED ORDER — GI COCKTAIL
Freq: Four times a day (QID) | ORAL | Status: DC
Start: 2010-09-20 — End: 2010-09-21
  Administered 2010-09-20: via ORAL

## 2010-09-20 MED ORDER — METOCLOPRAMIDE 10 MG TAB
10 mg | Freq: Four times a day (QID) | ORAL | Status: DC | PRN
Start: 2010-09-20 — End: 2010-09-21

## 2010-09-20 MED ORDER — FENTANYL CITRATE (PF) 50 MCG/ML IJ SOLN
50 mcg/mL | INTRAMUSCULAR | Status: AC
Start: 2010-09-20 — End: ?

## 2010-09-20 MED ORDER — LOSARTAN 50 MG TAB
50 mg | Freq: Every day | ORAL | Status: DC
Start: 2010-09-20 — End: 2010-09-21

## 2010-09-20 MED ORDER — HEPARIN (PORCINE) 10,000 UNIT/ML IJ SOLN
10000 unit/mL | INTRAMUSCULAR | Status: AC
Start: 2010-09-20 — End: ?

## 2010-09-20 NOTE — Procedures (Signed)
Brief Cardiac Catheterization Procedure Note    Patient: [name] MRN: [id number]  SSN: [id number]    Date of Birth: 02-04-1956  Age: 55 y.o.  Sex: male      Date of Procedure: 09/20/2010     Pre-procedure Diagnosis: Chest Pain    Post-procedure Diagnosis: Coronary Artery Disease    Procedure: Left Heart Catheterization    Performed By: Seabron Spates, MD     Anesthesia: Moderate Sedation    Estimated Blood Loss: Less than 10 mL      Specimens: None    Findings: LV with distal anteroapical hypokinesis.   [facility] huge nl   LAD proximal stent patent   Circumflex with mid 50%  small mid marginal with 50% proximal   continuation with 80% but very small distal vessel.  RV marginal fills retro from left.   RCA very large patulous and mid 40%2    Complications: none      Implants: None    Recommendations: Continue medical therapy.    Signed By: Seabron Spates, MD     September 20, 2010

## 2010-09-20 NOTE — Procedures (Signed)
[facility]                            [address]                           [place], [place]. [address]                                [phone]               [facility] - CATH LAB REPORT    NAME:  [name] [name], [name] [name]                              MR:  [id number]  LOC:  SD  28413             SEX:  M               ACCT:  [id number]  DOB:  07/14/1955            AGE:  55              PT:  T  ADMIT:  09/20/2010          DSCH:                 MSV:  MED      DATE OF PROCEDURE: 09/20/2010    CLINICAL: This is a gentleman with a history of coronary disease,  anterior myocardial infarction, also had a right coronary intervention  after abnormal stress testing. He presents with recurrent pattern prior  chest pain, shortness of breath, tightness, somewhat responsive to  nitroglycerin, that was unrelieved. In light of his multivessel coronary  stenting and similar symptoms to previous infarct pain a repeat  diagnostic catheterization is recommended for further evaluation,  possibly coronary intervention. Risks and options discussed.    PROCEDURE: Brought to the cardiac catheterization lab fasting. A right  radial approach was taken after demonstration of satisfactory Allen's  test. The right radial artery was cannulated using modified Seldinger  approach. Catheterization was performed with a 6-French Terumo sheath,  standard radial artery cocktail, JL3.5, JR5 catheters. He tolerated the  procedure well. Contrast was Optiray. At the conclusion the radial artery  sheath was withdrawn. Local hemostasis obtained with Terumo radial band.  He was transferred to the recovery area in stable and good condition.  Distal perfusion intact.    HEMODYNAMICS: Central aortic pressure was 140/90. LV pressure was  140/16.    LEFT VENTRICULOGRAM: Done in RAO 30 degree projection. This revealed an  apical wall motion abnormality. Hypokinesis, EF of 50%.    CORONARY ARTERIOGRAMS  1. Both the  right and left coronaries were large and patulous  proximally.  2. The left main coronary was quite large, was huge, and then tapered to  the LAD, circumflex systems.  3. The LAD coronary had evidence of proximal stenting which emanated from  the left main. The stented segment appeared widely patent with a good  inflow and outflow and no significant disruption. No luminal narrowing  and TIMI-3 flow in the distal LAD. There were no significant mid LAD  stenoses.  4. The circumflex coronary artery provided a ramus intermedius and a  moderate mid vessel marginal. The ramus  intermedius was tortuous but  appeared free of significant disease. There was a high diagonal as well  which was tortuous and paralleled the ramus. It was small in caliber and  extent, was diffusely irregular but no high-grade stenosis.  5. The circumflex coronary artery contained minor irregularity proximally  and then had a 40-50% stenosis. It was ectatic before it gave rise to the  mid vessel marginal. The mid vessel marginal had 50% stenosis in its  origin. The circumflex after the marginal origin contained an 80%  stenosis but was very limited in the distal circulation. On review of the  left system anatomy. There was no significant change from that previously  in October 2011.  6. The right coronary artery was also patulous and irregular, large  caliber trunk, tortuous with a mid vessel 40% stenosis, with a small  button of ulceration. Then was diffusely irregular throughout its distal  half. There was evidence of previous stenting from the distal right into  the PDA. The stented segment was widely patent. There was good luminal  appearance inflow and outflow to a large PDA. There was a subsegmental  PDA which was patent and had minor irregularity throughout. This was much  smaller in caliber than the primary PDA.  7. 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    IMPRESSION  1. Stable pattern of diffuse coronary disease as described.  2. Mild reduction in left ventricular systolic function with an apical  wall motion abnormality.    PLAN: I think the patient can continue medical management and risk factor  modification.    ADDENDUM: In light of the patient's large, patulous vessels I would  recommend dual antiplatelet therapy long term.                Seabron Spates, MD                This is an unverified document unless signed by physician.    TID:  [id number]                                      DT:  09/20/2010  8:23 P  JOB:  [id number]        DOC#:  [id number]           DD:  09/20/2010    cc:   Seabron Spates, MD

## 2010-09-20 NOTE — Progress Notes (Signed)
Discharge instructions given to patient and family. Education given on anticoagulants, incision care, and post cath plan of care. Informed of follow up appointments. Opportunity given for questions. Patient discharged to home with no complaints and no acute distress.

## 2010-09-20 NOTE — Progress Notes (Signed)
Cath Lab Transfer In    Transferred from [facility]  Transferred to : Cath Lab Procedure Room 1  Reason for Transfer: LHC poss PCI    Attending physician: Cebe      Patient Assessment    Patient received into procedure room.  No acute distress noted or verbalized.  Procedural prep completed.  Iv accesses assessed for patency.  Review of pertinent labs and allergies completed.  Noted presence of current History & Physical, updated within the previous 24 hours.  Consent signed.

## 2010-09-20 NOTE — Progress Notes (Signed)
TRANSFER - OUT REPORT:    Verbal report given to Modena Nunnery RN on NASIIR MONTS  being transferred to 2233 for routine progression of care       Report consisted of patient???s Situation, Background, Assessment and   Recommendations(SBAR).     Information from the following report(s) ED Summary, MAR and Recent Results was reviewed with the receiving nurse.    Opportunity for questions and clarification was provided.

## 2010-09-20 NOTE — Consults (Signed)
[facility] Consult      Patient ID:  [name] [name]  [id number]  55 y.o.  04-30-1955    Admit date: 09/20/2010    Consulting Physician: Dr. Luiz Blare    Requesting Physician:  Dr. Verlon Au    Primary Cardiologist: Dr. Merwyn Katos    Primary Care Physician: Dr. Dawson Bills    Admission Diagnoses: chest pain      Subjective: [name] [name] is a 55 y.o. with a history of CAD s/p PCI prox LAD 11/2009, PCI distal RCA/PDA 01/2010, HTN, and dyslipidemia who developed chest pressure last night. The pain at its worst was about a 2/10. He was able to sleep without any problems. When he awoke this morning he continued to have chest pressure. He became concerned and presented to the ED for further evaluation. He notes that this chest pressure is not as severe as he has had in the past. He denies any diaphoresis, SOB, orthopnea, PND, palpitations, or pedal edema. He has noticed some diarrhea and reports having diarrhea associated with his prior episodes requiring PCI. He also reports bilateral groin pain with walking more than a block for several months. He denies any radiation of pain down his legs. His prior cath was done via right radial approach. He has noticed fatigue and frequent sneezing. He denies any fever, chills, sweats, or coughing.   He has been taking Effient daily without missing a dose. He admits that he has not been taking Lipitor for several months because he ran out and hasn't gotten it refilled. He denies current tobacco abuse.       Past Medical History   Diagnosis Date   ??? Hypertension    ??? CAD (coronary artery disease)    ??? Unstable angina 02/02/2010   ??? Dyslipidemia      Past Surgical History   Procedure Date   ??? Cardiac surg procedure unlist 11/2009     stent x1 LAD    ??? Left heart cath,percutaneous 02/02/2010     stent x1     History     Social History   ??? Marital Status: Married     Spouse Name: N/A     Number of Children: N/A   ??? Years of Education: N/A     Occupational History    ??? Not on file.     Social History Main Topics   ??? Smoking status: Never Smoker    ??? Smokeless tobacco: Not on file   ??? Alcohol Use: No   ??? Drug Use:    ??? Sexually Active:      Other Topics Concern   ??? Not on file     Social History Narrative   ??? No narrative on file     No current facility-administered medications for this encounter.     Current Outpatient Prescriptions   Medication Sig   ??? carvedilol (COREG) 12.5 mg tablet Take 12.5 mg by mouth two (2) times daily (with meals).   ??? omega 3-dha-epa-fish oil 980-1,400 mg CpDR Take  by mouth.   ??? coenzyme q10-vitamin e (COQ10 SG 100) 100-100 mg-unit Cap Take  by mouth.   ??? aspirin (ASPIRIN) 325 mg tablet Take 325 mg by mouth daily.   ??? prasugrel (EFFIENT) 10 mg tablet Take 10 mg by mouth daily.   ??? prasugrel (EFFIENT) 10 mg tablet Take 1 Tab by mouth daily.   ??? nitroglycerin (NITROSTAT) 0.4 mg SL tablet 1 Tab by SubLINGual route every five (5) minutes  as needed for Chest Pain.   ??? losartan (COZAAR) 25 mg tablet Take 1 Tab by mouth daily.   ??? atorvastatin (LIPITOR) 80 mg tablet Take 1 Tab by mouth nightly.     No Known Allergies  No family history on file.      ROS:   Constitutional + fatigue, No fevers, chills, night sweats,  or weight loss.   Allergic/Immunologic No recent allergic reactions   Eyes No significant visual difficulties. No diplopia.   ENMT No problems with hearing, no sore throat, no sinus drainage.   Endocrine No hot flashes or night sweats. No cold intolerance, polyuria, or polydipsia   Hematologic/Lymphatic No easy bruising or bleeding.  The patient denies any tender or palpable lymph nodes    .   Respiratory + chest pressure, No dyspnea on exertion, orthopnea,  cough or hemoptysis.   Cardiovascular + anginal chest pain, no irregular heart beat, tachycardia, palpitations or orthopnea.   Gastrointestinal + diarrhea No nausea, vomiting, constipation, cramping, dysphagia, reflux, heartburn, GI bleeding, or early satiety.  No change in bowel habits.    Genitourinary No hematuria, dysuria, increased frequency, urgency, hesitancy or incontinence.   Musculoskeletal No joint pain, swelling or redness. No decreased range of motion.   Integumentary No chronic rashes, inflammation, ulcerations, pruritus, petechiae, purpura, ecchymoses, or skin changes.   Neurologic No headache, blurred vision, and no areas of focal weakness or numbness. No sensory problems.   Psychiatric No insomnia, depression, mania or mood swings.  No psychotropic drugs.             Objective: BP 138/85   Pulse 61   Temp 98.2 ??F (36.8 ??C)   Resp 18   Ht 6\' 4"  (1.93 m)   Wt 124.739 kg (275 lb)   BMI 33.47 kg/m2   SpO2 97% Temp (24hrs), Avg:98.2 ??F (36.8 ??C), Min:98.2 ??F (36.8 ??C), Max:98.2 ??F (36.8 ??C)      Physical Exam:  General:Caucasian male, sitting on the side of the bed in NAD  Head:normocephalic, atraumatic  EENT:PERRLA, EOMI, orpharynx benign  Neck: normal appearance, no JVD, thyroid non-palpable, non-tender  Heart: RRR, S1S2, No MGR  Lungs: CTAB, no wheezing  VWU:JWJX, soft, NT  Skin:warm and dry  Ext: no pedal edema  Musculoskeletal:normal muscle strength, movement, and tone  Neuro: A&Ox3, CN II-XII grossly intact  Psych:normal mood and affect    LABS:   Lab Results   Component Value Date/Time    CK 79 09/20/2010  8:22 AM     Results for orders placed during the hospital encounter of 02/02/10   EKG, 12 LEAD, INITIAL       Component Value Range    Systolic BP        Diastolic BP        Ventricular Rate 73      Atrial Rate 73      P-R Interval 168      QRS Duration 118      Q-T Interval 416      QTC Calculation (Bezet) 458      Calculated P Axis 40      Calculated R Axis -37      Calculated T Axis 66      Diagnosis        Value: Normal sinus rhythm      Left axis deviation      Inferior infarct (cited on or before 07-Dec-2009)      Anteroseptal infarct (cited on or before 06-Dec-2009)  Abnormal ECG      When compared with ECG of 02-Feb-2010 05:51,       Questionable change in initial forces of Anterior leads      Confirmed by MCCOTTER  MD (UC), CRAIG J ([id number]) on 02/02/2010 2:23:35 PM     Last BMP:   Recent Labs   [facility] 09/20/10 0822    NA 141    K 4.4    CL 106    CO2 31    GLU 110*    BUN 15    CREA 1.1         EKG Finding: bradycardia, 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 We will check serial cardiac enzymes to rule out MI  Active Problems:     Coronary atherosclerosis of native coronary artery (12/06/2009)-continue Effient, ASA, BB, ARB, and NTG prn     Dyslipidemia (12/06/2009)-continue statin     Hypertension (02/02/2010)-fairly controlled, continue home medications    Bilateral groin pain- associated with walking, he may need workup for PVD          Lucy Chris, PA September 20, 2010 12:05 PM

## 2010-09-20 NOTE — ED Notes (Signed)
Patient back from xray. Tolerated well. No acute distress noted. Wife at bedside

## 2010-09-20 NOTE — Progress Notes (Signed)
TRANSFER - IN REPORT:    Verbal report received from Rachel(name) on [name] [name]  being received from Cath Lab(unit) for routine progression of care      Report consisted of patient???s Situation, Background, Assessment and   Recommendations(SBAR).     Information from the following report(s) SBAR, Procedure Summary, MAR and Recent Results was reviewed with the receiving nurse.    Opportunity for questions and clarification was provided.      Assessment completed upon patient???s arrival to unit and care assumed.

## 2010-09-20 NOTE — Procedures (Signed)
Brief Cardiac Catheterization Procedure Note    Patient: [name] [name] MRN: [id number]  SSN: [id number]    Date of Birth: 01/21/1956  Age: 55 y.o.  Sex: male      Date of Procedure: 09/20/2010     Pre-procedure Diagnosis: Chest Pain    Post-procedure Diagnosis: Coronary Artery Disease    Procedure: Left Heart Catheterization    Performed By: Bayani Renteria E Alistar Mcenery, MD     Anesthesia: Moderate Sedation    Estimated Blood Loss: Less than 10 mL      Specimens: None    Findings: LV with distal anteroapical hypokinesis.   LMC huge nl   LAD proximal stent patent   Circumflex with mid 50%  small mid marginal with 50% proximal   continuation with 80% but very small distal vessel.  RV marginal fills retro from left.   RCA very large patulous and mid 40%2    Complications: none      Implants: None    Recommendations: Continue medical therapy.    Signed By: Antino Mayabb E Appolonia Ackert, MD     September 20, 2010

## 2010-09-20 NOTE — Consults (Signed)
[facility]     09/20/2010     6:24 PM    I have personally seen and examined Buckner Malta with Louie Boston PA.  I agree and confirm findings in history, physical exam, and assessment/plan as outlined above with following pertinent additions/exceptions:        Patient well known from multiple interventions in 2011.  Current pain similar to that before multistenting PDA.   We talked about stress testing but he had unstable angina after his less stress test. He has no confidence in repeating. I do think that his coronaries need evaluation and have recommended cath for further evaluation.   exam is normal     EKG with old anterior MI        Seabron Spates, MD

## 2010-09-20 NOTE — ED Notes (Signed)
Back from xray tolerated well. No acute distress noted.

## 2010-09-20 NOTE — Progress Notes (Signed)
Pt arrived to floor.  Assessments and vital signs performed.

## 2010-09-20 NOTE — Progress Notes (Signed)
[place]     Pt. Last Name: [name]       Pt. First Name: [name] [name]   MR#: [id number] / Admit#: [id number]   [place], [place] [address]    DOB: 1955/05/30 / Age: 55  Attn.: Hal Morales  Location: ER -         Case Management - Progress Note  Initial Open Date: 09/20/2010   Case Manager:     Initial Open Date: 09/20/2010  Social Worker: Moise Boring LISW-CP    Expected Date of Discharge:   Transferred From: Home  ECF Bed Held Until:   Bed Held By:     Power of Attorney:   POA/Guardian/Conservator Capacity:    Primary Caregiver: Benita Stabile [phone]  Living Arrangements: Lives with Caregiver    Source of Income: Employed  Payee:   Psychosocial History:   Cultural/Religious/Language Issues:   Education Level:   ADLS/Current Living Arrangements Issues: Patient lives with his wife and two   children.   He is independent with his ADLs and walking.    Past Providers:     Will patient perform self care at discharge?     Anticipated Discharge Disposition Goal:     Assessment/Plan:   09/20/2010 01:20P   SW interviewed patient and found that he is being admitted   for chest pain.   Patient is employed but uninsured, and is currently under   hospital sponsorship.   SW shared with patient information about [facility].     Patient has never received rehab services, but SW mentioned Cardiac Rehab,   and he wonders if there is any way he could get sponsored for that service.     Moise Boring, LISW-CP       Resources at Discharge:           Service Providers at Discharge:

## 2010-09-20 NOTE — Progress Notes (Signed)
TRANSFER - IN REPORT:    Verbal report received from [employer], RN(name) on LEKENDRICK ALPERN  being received from ED(unit) for routine progression of care      Report consisted of patient???s Situation, Background, Assessment and   Recommendations(SBAR).     Information from the following report(s) SBAR, MAR and Recent Results was reviewed with the receiving nurse.    Opportunity for questions and clarification was provided.      Awaiting pt arrival to floor.

## 2010-09-20 NOTE — ED Provider Notes (Signed)
HPI Comments: Here with chest pressure that is similar not to his MI but to period just before last cath and stenting. Has take nitroglycerin but no signif relief. No fever or infectious changes.    Cardiologist: Payne/ Cebe    Patient is a 55 y.o. male presenting with chest pain. The history is provided by the patient and the spouse.   Chest Pain (Angina)   This is a recurrent problem. The current episode started 12 to 24 hours ago. The problem has been gradually improving. The pain is associated with normal activity. The pain is present in the substernal region. The pain is moderate. The quality of the pain is described as pressure-like. The pain does not radiate. Associated symptoms include shortness of breath and weakness. Pertinent negatives include no cough, no fever, no hemoptysis, no nausea, no sputum production and no vomiting. Risk factors include cardiac disease. Procedural history includes cardiac catheterization and cardiac stents.       Past Medical History   Diagnosis Date   ??? Hypertension    ??? CAD (coronary artery disease)    ??? Unstable angina 02/02/2010   ??? Dyslipidemia         Past Surgical History   Procedure Date   ??? Cardiac surg procedure unlist 11/2009     stent x1 LAD    ??? Left heart cath,percutaneous 02/02/2010     stent x1         No family history on file.     History     Social History   ??? Marital Status: Married     Spouse Name: N/A     Number of Children: N/A   ??? Years of Education: N/A     Occupational History   ??? Not on file.     Social History Main Topics   ??? Smoking status: Never Smoker    ??? Smokeless tobacco: Not on file   ??? Alcohol Use: No   ??? Drug Use:    ??? Sexually Active:      Other Topics Concern   ??? Not on file     Social History Narrative   ??? No narrative on file                  ALLERGIES: Review of patient's allergies indicates no known allergies.      Review of Systems   Constitutional: Negative for fever.    Respiratory: Positive for shortness of breath. Negative for cough, hemoptysis and sputum production.    Cardiovascular: Positive for chest pain.   Gastrointestinal: Negative for nausea and vomiting.   Genitourinary: Negative.    Musculoskeletal: Negative.    Neurological: Positive for weakness.   Psychiatric/Behavioral: Negative.    All other systems reviewed and are negative.        Filed Vitals:    09/20/10 0810   BP: 138/85   Pulse: 61   Temp: 98.2 ??F (36.8 ??C)   Resp: 18   Height: 6\' 4"  (1.93 m)   Weight: 124.739 kg (275 lb)   SpO2: 97%            Physical Exam   Nursing note and vitals reviewed.  Constitutional: He appears well-developed and well-nourished. No distress.   HENT:   Mouth/Throat: Oropharynx is clear and moist.   Eyes: Conjunctivae are normal. No scleral icterus.   Neck: Neck supple.   Cardiovascular: Normal rate, regular rhythm, normal heart sounds and intact distal pulses.    Pulmonary/Chest: Effort  normal and breath sounds normal. He has no rales. He exhibits no tenderness.   Abdominal: Soft. He exhibits no distension. There is no tenderness.   Neurological: He is alert.   Skin: No rash noted. He is not diaphoretic. No erythema.   Psychiatric: His behavior is normal. Thought content normal.        MDM     Amount and/or Complexity of Data Reviewed:   Clinical lab tests:  Reviewed and ordered  Tests in the radiology section of CPT??:  Reviewed and ordered   Decide to obtain previous medical records or to obtain history from someone other than the patient:  Yes   Discuss the patient with another provider:  Yes  Risk of Significant Complications, Morbidity, and/or Mortality:   Presenting problems:  High  Diagnostic procedures:  High  Management options:  High  Progress:   Patient progress:  Stable      Procedures    Recent Results (from the past 12 hour(s))   CBC WITH AUTOMATED DIFF    Collection Time    09/20/10  8:22 AM       Component Value Range    WBC 5.7  4.3 - 11.1 (K/uL)     RBC 5.08  4.23 - 5.67 (M/uL)    HGB 14.3  13.2 - 17.1 (g/dL)    HCT 16.1  09.6 - 04.5 (%)    MCV 83.1  79.6 - 97.8 (FL)    MCH 28.1  26.1 - 32.9 (PG)    MCHC 33.9  31.4 - 35.0 (g/dL)    RDW 40.9  81.1 - 91.4 (%)    PLATELET 173  150 - 450 (K/uL)    MPV 10.0 (*) 10.8 - 14.1 (FL)    DF AUTOMATED      NEUTROPHILS 59  43 - 78 (%)    LYMPHOCYTES 25  13 - 44 (%)    MONOCYTES 7  4.0 - 12.0 (%)    EOSINOPHILS 8 (*) 0.5 - 7.8 (%)    BASOPHILS 1  0.0 - 2.0 (%)    IMMATURE GRANULOCYTES 0.3  0.0 - 5.0 (%)    ABS. NEUTROPHILS 3.4  1.7 - 8.2 (K/UL)    ABS. LYMPHOCYTES 1.4  0.5 - 4.6 (K/UL)    ABS. MONOCYTES 0.4  0.1 - 1.3 (K/UL)    ABS. EOSINOPHILS 0.5  0.0 - 0.8 (K/UL)    ABS. BASOPHILS 0.0  0.0 - 0.2 (K/UL)    ABS. IMM. GRANS. 0.0  0.0 - 0.5 (K/UL)   METABOLIC PANEL, COMPREHENSIVE    Collection Time    09/20/10  8:22 AM       Component Value Range    Sodium 141  136 - 145 (MMOL/L)    Potassium 4.4  3.5 - 5.1 (MMOL/L)    Chloride 106  98 - 107 (MMOL/L)    CO2 31  23 - 32 (MMOL/L)    Anion gap 4 (*) 7 - 16 (mmol/L)    Glucose 110 (*) 65 - 100 (MG/DL)    BUN 15  6 - 23 (MG/DL)    Creatinine 1.1  0.8 - 1.5 (MG/DL)    GFR est AA >78  >29 (ml/min/1.22m2)    GFR est non-AA >60  >60 (ml/min/1.42m2)    Calcium 9.0  8.3 - 10.4 (MG/DL)    Bilirubin, total 0.6  0.2 - 1.1 (MG/DL)    ALT 46  39 - 65 (U/L)    AST 18  15 - 37 (  U/L)    Alk. phosphatase 114  50 - 136 (U/L)    Protein, total 7.4  6.3 - 8.2 (g/dL)    Albumin 3.9  3.5 - 5.0 (g/dL)    Globulin 3.5  2.3 - 3.5 (g/dL)    A-G Ratio 1.1 (*) 1.2 - 3.5 ( )   TROPONIN I    Collection Time    09/20/10  8:22 AM       Component Value Range    Troponin-I, Qt. <0.04 (*) 0.04 - 0.05 (NG/ML)   MYOGLOBIN    Collection Time    09/20/10  8:22 AM       Component Value Range    Myoglobin 48  16 - 96 (ng/mL)   CK WITH MB    Collection Time    09/20/10  8:22 AM       Component Value Range    CK 79  21 - 215 (U/L)    CK - MB 0.7  0.5 - 3.6 (ng/ml)    CK-MB Index 0.9  <2.5 (%)

## 2010-09-20 NOTE — ED Notes (Signed)
Patient to xray

## 2010-09-20 NOTE — ED Notes (Signed)
Dr Daleen Snook at bedside to evaluate patient

## 2010-09-20 NOTE — ED Notes (Signed)
Int started and labs drawn and sent. Triage and assessment complete, patient states he has chest pressure similar to when he had his second stent placed. Denies sob and nausea.

## 2010-09-20 NOTE — Progress Notes (Signed)
To room 2233 via bed.  TR band to right wrist intact.  No bleeding or hematoma.  Nurse at bedside.

## 2010-09-20 NOTE — Procedures (Signed)
[facility]                            [address]                           [place], [place]. [address]                                [phone]               [facility] - CATH LAB REPORT    NAME:  [name] [name], [name] [name]                              MR:  [id number]  LOC:  SD  [id number]             SEX:  M               ACCT:  [id number]  DOB:  [birth year]            AGE:  55              PT:  T  ADMIT:  09/20/2010          DSCH:                 MSV:  MED      DATE OF PROCEDURE: 09/20/2010    CLINICAL: This is a gentleman with a history of coronary disease,  anterior myocardial infarction, also had a right coronary intervention  after abnormal stress testing. He presents with recurrent pattern prior  chest pain, shortness of breath, tightness, somewhat responsive to  nitroglycerin, that was unrelieved. In light of his multivessel coronary  stenting and similar symptoms to previous infarct pain a repeat  diagnostic catheterization is recommended for further evaluation,  possibly coronary intervention. Risks and options discussed.    PROCEDURE: Brought to the cardiac catheterization lab fasting. A right  radial approach was taken after demonstration of satisfactory Allen's  test. The right radial artery was cannulated using modified Seldinger  approach. Catheterization was performed with a 6-French Terumo sheath,  standard radial artery cocktail, JL3.5, JR5 catheters. He tolerated the  procedure well. Contrast was Optiray. At the conclusion the radial artery  sheath was withdrawn. Local hemostasis obtained with Terumo radial band.  He was transferred to the recovery area in stable and good condition.  Distal perfusion intact.    HEMODYNAMICS: Central aortic pressure was 140/90. LV pressure was  140/16.    LEFT VENTRICULOGRAM: Done in RAO 30 degree projection. This revealed an  apical wall motion abnormality. Hypokinesis, EF of 50%.    CORONARY ARTERIOGRAMS   1. Both the right and left coronaries were large and patulous  proximally.  2. The left main coronary was quite large, was huge, and then tapered to  the LAD, circumflex systems.  3. The LAD coronary had evidence of proximal stenting which emanated from  the left main. The stented segment appeared widely patent with a good  inflow and outflow and no significant disruption. No luminal narrowing  and TIMI-3 flow in the distal LAD. There were no significant mid LAD  stenoses.  4. The circumflex coronary artery provided a ramus intermedius and a  moderate mid vessel marginal. The ramus  intermedius was tortuous but  appeared free of significant disease. There was a high diagonal as well  which was tortuous and paralleled the ramus. It was small in caliber and  extent, was diffusely irregular but no high-grade stenosis.  5. The circumflex coronary artery contained minor irregularity proximally  and then had a 40-50% stenosis. It was ectatic before it gave rise to the  mid vessel marginal. The mid vessel marginal had 50% stenosis in its  origin. The circumflex after the marginal origin contained an 80%  stenosis but was very limited in the distal circulation. On review of the  left system anatomy. There was no significant change from that previously  in October 2011.  6. The right coronary artery was also patulous and irregular, large  caliber trunk, tortuous with a mid vessel 40% stenosis, with a small  button of ulceration. Then was diffusely irregular throughout its distal  half. There was evidence of previous stenting from the distal right into  the PDA. The stented segment was widely patent. There was good luminal  appearance inflow and outflow to a large PDA. There was a subsegmental  PDA which was patent and had minor irregularity throughout. This was much  smaller in caliber than the primary PDA.  7. 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    IMPRESSION  1. Stable pattern of diffuse coronary disease as described.  2. Mild reduction in left ventricular systolic function with an apical  wall motion abnormality.    PLAN: I think the patient can continue medical management and risk factor  modification.    ADDENDUM: In light of the patient's large, patulous vessels I would  recommend dual antiplatelet therapy long term.                Seabron Spates, MD                This is an unverified document unless signed by physician.    TID:  [id number]                                      DT:  09/20/2010  8:23 P  JOB:  [id number]        DOC#:  [id number]           DD:  09/20/2010    cc:   Seabron Spates, MD

## 2010-09-20 NOTE — Progress Notes (Signed)
TRANSFER - OUT REPORT:    [facility]  Dr. Daleen Snook  RRA access    Versed 2mg , fentanyl  Patent stents, no changes since last cath  TR band right wrist with 15ml air    Verbal report given to [facility] RN(name) on [name] [name]  being transferred to cvsd(unit) for routine progression of care       Report consisted of patient???s Situation, Background, Assessment and   Recommendations(SBAR).     Information from the following report(s) Procedure Summary was reviewed with the receiving nurse.    Opportunity for questions and clarification was provided.

## 2013-01-13 LAB — LIPID PANEL
CHOLESTEROL: 176 mg/dL (ref 0–200)
HDL: 83 mg/dL — AB (ref 35–70)
TRIGLYCERIDES: 45 mg/dL (ref 40–160)

## 2013-03-04 LAB — TSH: TSH: 1.83 u[IU]/mL (ref 0.41–5.90)

## 2013-03-04 LAB — CBC AND DIFFERENTIAL
HCT: 48 % (ref 41–53)
HEMOGLOBIN: 16.9 g/dL (ref 13.5–17.5)
NEUTROS ABS: 4 /uL
Platelets: 279 10*3/uL (ref 150–399)
WBC: 6.4 10^3/mL

## 2013-03-04 LAB — HEPATIC FUNCTION PANEL
ALT: 25 U/L (ref 10–40)
AST: 23 U/L (ref 14–40)
Alkaline Phosphatase: 49 U/L (ref 25–125)
Bilirubin, Total: 0.7 mg/dL

## 2013-03-04 LAB — PSA: PSA: 1.1

## 2013-03-04 LAB — BASIC METABOLIC PANEL
BUN: 21 mg/dL (ref 4–21)
CREATININE: 1.2 mg/dL (ref 0.6–1.3)
Glucose: 97 mg/dL
Potassium: 5.1 mmol/L (ref 3.4–5.3)
SODIUM: 145 mmol/L (ref 137–147)

## 2013-09-23 ENCOUNTER — Other Ambulatory Visit: Payer: Self-pay | Admitting: Orthopedic Surgery

## 2013-09-23 DIAGNOSIS — M25562 Pain in left knee: Secondary | ICD-10-CM

## 2013-09-30 ENCOUNTER — Ambulatory Visit
Admission: RE | Admit: 2013-09-30 | Discharge: 2013-09-30 | Disposition: A | Discharge status: Home / Self Care | Payer: 59 | Source: Ambulatory Visit | Attending: Orthopedic Surgery | Admitting: Orthopedic Surgery

## 2013-09-30 DIAGNOSIS — M25562 Pain in left knee: Secondary | ICD-10-CM

## 2014-12-24 ENCOUNTER — Ambulatory Visit (INDEPENDENT_AMBULATORY_CARE_PROVIDER_SITE_OTHER): Payer: 59 | Admitting: Family Medicine

## 2014-12-24 ENCOUNTER — Encounter: Payer: Self-pay | Admitting: Emergency Medicine

## 2014-12-24 ENCOUNTER — Encounter: Payer: Self-pay | Admitting: Family Medicine

## 2014-12-24 VITALS — BP 108/62 | HR 64 | Temp 98.0°F | Resp 16 | Ht 68.5 in | Wt 203.0 lb

## 2014-12-24 DIAGNOSIS — Z01818 Encounter for other preprocedural examination: Secondary | ICD-10-CM

## 2014-12-24 DIAGNOSIS — Z125 Encounter for screening for malignant neoplasm of prostate: Secondary | ICD-10-CM

## 2014-12-24 DIAGNOSIS — Z1211 Encounter for screening for malignant neoplasm of colon: Secondary | ICD-10-CM

## 2014-12-24 DIAGNOSIS — M1990 Unspecified osteoarthritis, unspecified site: Secondary | ICD-10-CM | POA: Insufficient documentation

## 2014-12-24 DIAGNOSIS — M1710 Unilateral primary osteoarthritis, unspecified knee: Secondary | ICD-10-CM

## 2014-12-24 DIAGNOSIS — Z0000 Encounter for general adult medical examination without abnormal findings: Secondary | ICD-10-CM

## 2014-12-24 LAB — POCT URINALYSIS DIPSTICK
Bilirubin, UA: NEGATIVE
Glucose, UA: NEGATIVE
Ketones, UA: NEGATIVE
Leukocytes, UA: NEGATIVE
Nitrite, UA: NEGATIVE
PH UA: 7.5
SPEC GRAV UA: 1.015
UROBILINOGEN UA: 0.2

## 2014-12-24 LAB — IFOBT (OCCULT BLOOD): IMMUNOLOGICAL FECAL OCCULT BLOOD TEST: NEGATIVE

## 2014-12-24 NOTE — Progress Notes (Signed)
Patient ID: Stephen Minium., male   DOB: 02/13/1956, 59 y.o.   MRN: [id number]       Patient: Stephen Poupard., Male    DOB: [birth year], 59 y.o.   MRN: [id number] Visit Date: 12/24/2014  Today's Provider: Wilhemena Durie, MD   Chief Complaint  Patient presents with  . Annual Exam  . Medical Clearance   Subjective:    Annual physical exam Stephen Creppel. is a 59 y.o. male who presents today for health maintenance and complete physical. He feels well. He reports he is not exercising much, since he is about to have a knee replacement. He reports he is sleeping fairly well.  ----------------------------------------------------------------- Colonoscopy- pt has never had one, but is willing to get one after his knee replacement surgery is complete and he is healed. EKG- 03/04/13 Tdap-03/04/13 PSA- 03/04/13-1.1  Pt will get flu vaccine through his employer.  Review of Systems  Constitutional: Negative.   HENT: Negative.   Eyes: Negative.   Respiratory: Negative.   Cardiovascular: Negative.   Gastrointestinal: Negative.   Endocrine: Negative.   Genitourinary: Negative.   Musculoskeletal: Positive for arthralgias.  Skin: Negative.   Allergic/Immunologic: Negative.   Neurological: Negative.   Hematological: Negative.   Psychiatric/Behavioral: Negative.     Social History He  reports that he has never smoked. He does not have any smokeless tobacco history on file. He reports that he drinks alcohol. He reports that he does not use illicit drugs. Social History   Social History  . Marital Status: Married    Spouse Name: N/A  . Number of Children: N/A  . Years of Education: N/A   Social History Main Topics  . Smoking status: Never Smoker   . Smokeless tobacco: None  . Alcohol Use: Yes     Comment: 2 drinks daily  . Drug Use: No  . Sexual Activity: Not Asked   Other Topics Concern  . None   Social History Narrative  . None    Patient Active Problem List   Diagnosis Date Noted  . Arthritis 12/24/2014    Past Surgical History  Procedure Laterality Date  . Joint replacement Right 2005  . Varicose vein surgery Bilateral 2003    ligation and stripping  . Hernia repair  5361    umbilical hernia    Family History  Family Status  Relation Status Death Age  . Mother Alive   . Father Deceased 33    Cause of death was dementia  . Sister Alive   . Brother Alive   . Son Alive   . Son Alive    His family history includes Dementia in his father.    Allergies  Allergen Reactions  . Bee Venom Shortness Of Breath and Swelling    Previous Medications   GLUCOSAMINE-CHONDROIT-VIT C-MN PO    Take by mouth.   MELOXICAM (MOBIC) 15 MG TABLET    Take by mouth.   MULTIPLE VITAMIN PO    Take by mouth.   OMEGA-3 FATTY ACIDS PO    Take by mouth.    Patient Care Team: Jerrol Banana., MD as PCP - General (Family Medicine)     Objective:   Vitals: BP 108/62 mmHg  Pulse 64  Temp(Src) 98 F (36.7 C) (Oral)  Resp 16  Ht 5' 8.5" (1.74 m)  Wt 203 lb (92.08 kg)  BMI 30.41 kg/m2   Physical Exam  Constitutional: He is oriented to person, place, and time.  He appears well-developed and well-nourished.  HENT:  Head: Normocephalic and atraumatic.  Right Ear: External ear normal.  Left Ear: External ear normal.  Nose: Nose normal.  Mouth/Throat: Oropharynx is clear and moist.  Eyes: Conjunctivae and EOM are normal. Pupils are equal, round, and reactive to light.  Neck: Normal range of motion. Neck supple.  Cardiovascular: Normal rate, regular rhythm, normal heart sounds and intact distal pulses.   Pulmonary/Chest: Effort normal and breath sounds normal.  Abdominal: Soft. Bowel sounds are normal.  Genitourinary: Rectum normal, prostate normal and penis normal.  Musculoskeletal: Normal range of motion.  Neurological: He is alert and oriented to person, place, and time. He has normal reflexes.  Skin: Skin is warm and dry.  Psychiatric: He  has a normal mood and affect. His behavior is normal. Judgment and thought content normal.     Depression Screen PHQ 2/9 Scores 12/24/2014  PHQ - 2 Score 0      Assessment & Plan:     Routine Health Maintenance and Physical Exam  Exercise Activities and Dietary recommendations Goals    None      Immunization History  Administered Date(s) Administered  . Tdap 03/04/2013    There are no preventive care reminders to display for this patient.    Discussed health benefits of physical activity, and encouraged him to engage in regular exercise appropriate for his age and condition.             Patient is medically cleared for total knee replacement. --------------------------------------------------------------------

## 2014-12-25 LAB — CBC WITH DIFFERENTIAL/PLATELET
BASOS ABS: 0 10*3/uL (ref 0.0–0.2)
BASOS: 1 %
EOS (ABSOLUTE): 0.1 10*3/uL (ref 0.0–0.4)
Eos: 1 %
Hematocrit: 47.5 % (ref 37.5–51.0)
Hemoglobin: 16.4 g/dL (ref 12.6–17.7)
IMMATURE GRANS (ABS): 0 10*3/uL (ref 0.0–0.1)
IMMATURE GRANULOCYTES: 0 %
LYMPHS: 21 %
Lymphocytes Absolute: 1.4 10*3/uL (ref 0.7–3.1)
MCH: 31.5 pg (ref 26.6–33.0)
MCHC: 34.5 g/dL (ref 31.5–35.7)
MCV: 91 fL (ref 79–97)
Monocytes Absolute: 0.4 10*3/uL (ref 0.1–0.9)
Monocytes: 5 %
NEUTROS PCT: 72 %
Neutrophils Absolute: 4.8 10*3/uL (ref 1.4–7.0)
PLATELETS: 246 10*3/uL (ref 150–379)
RBC: 5.2 x10E6/uL (ref 4.14–5.80)
RDW: 13.7 % (ref 12.3–15.4)
WBC: 6.7 10*3/uL (ref 3.4–10.8)

## 2014-12-25 LAB — LIPID PANEL WITH LDL/HDL RATIO
CHOLESTEROL TOTAL: 180 mg/dL (ref 100–199)
HDL: 86 mg/dL (ref >39)
LDL Calculated: 85 mg/dL (ref 0–99)
LDl/HDL Ratio: 1 ratio units (ref 0.0–3.6)
TRIGLYCERIDES: 47 mg/dL (ref 0–149)
VLDL CHOLESTEROL CAL: 9 mg/dL (ref 5–40)

## 2014-12-25 LAB — PSA: Prostate Specific Ag, Serum: 2 ng/mL (ref 0.0–4.0)

## 2014-12-25 LAB — COMPREHENSIVE METABOLIC PANEL
ALT: 26 IU/L (ref 0–44)
AST: 18 IU/L (ref 0–40)
Albumin/Globulin Ratio: 1.9 (ref 1.1–2.5)
Albumin: 4.6 g/dL (ref 3.5–5.5)
Alkaline Phosphatase: 51 IU/L (ref 39–117)
BUN/Creatinine Ratio: 19 (ref 9–20)
BUN: 21 mg/dL (ref 6–24)
Bilirubin Total: 0.8 mg/dL (ref 0.0–1.2)
CALCIUM: 9.3 mg/dL (ref 8.7–10.2)
CO2: 26 mmol/L (ref 18–29)
CREATININE: 1.08 mg/dL (ref 0.76–1.27)
Chloride: 101 mmol/L (ref 97–108)
GFR calc Af Amer: 87 mL/min/{1.73_m2} (ref >59)
GFR, EST NON AFRICAN AMERICAN: 75 mL/min/{1.73_m2} (ref >59)
Globulin, Total: 2.4 g/dL (ref 1.5–4.5)
Glucose: 99 mg/dL (ref 65–99)
Potassium: 4.7 mmol/L (ref 3.5–5.2)
Sodium: 147 mmol/L — ABNORMAL HIGH (ref 134–144)
Total Protein: 7 g/dL (ref 6.0–8.5)

## 2014-12-25 LAB — HEMOGLOBIN A1C
Est. average glucose Bld gHb Est-mCnc: 111 mg/dL
HEMOGLOBIN A1C: 5.5 % (ref 4.8–5.6)

## 2014-12-25 LAB — TSH: TSH: 1.36 u[IU]/mL (ref 0.450–4.500)

## 2014-12-29 ENCOUNTER — Other Ambulatory Visit: Payer: Self-pay | Admitting: Orthopedic Surgery

## 2015-01-13 NOTE — Pre-Procedure Instructions (Signed)
Stephen Nap Jr.  01/13/2015      [facility] [place] [address], [place] [address] [place] [address] Phone: [phone] Fax: [phone]    Your procedure is scheduled on Mon, Oct 10 @ 7:30 AM  Report to [facility] Admitting at 5:30 AM  Call this number if you have problems the morning of surgery:  [phone]   Remember:  Do not eat food or drink liquids after midnight.  Take these medicines the morning of surgery with A SIP OF WATER              Stop taking your Mobic,Glucosamine,Omega 3,and Vitamins.No Goody's,BC's,Aleve,Aspirin,Ibuprofen,or any Herbal Medications.    Do not wear jewelry.  Do not wear lotions, powders, or colognes.  You may wear deodorant.            Men may shave face and neck.  Do not bring valuables to the hospital.  [facility] is not responsible for any belongings or valuables.  Contacts, dentures or bridgework may not be worn into surgery.  Leave your suitcase in the car.  After surgery it may be brought to your room.  For patients admitted to the hospital, discharge time will be determined by your treatment team.  Patients discharged the day of surgery will not be allowed to drive home.    Special instructions:  [place] - Preparing for Surgery  Before surgery, you can play an important role.  Because skin is not sterile, your skin needs to be as free of germs as possible.  You can reduce the number of germs on you skin by washing with CHG (chlorahexidine gluconate) soap before surgery.  CHG is an antiseptic cleaner which kills germs and bonds with the skin to continue killing germs even after washing.  Please DO NOT use if you have an allergy to CHG or antibacterial soaps.  If your skin becomes reddened/irritated stop using the CHG and inform your nurse when you arrive at Short Stay.  Do not shave (including legs and underarms) for at least 48 hours prior to the first CHG shower.  You may shave  your face.  Please follow these instructions carefully:   1.  Shower with CHG Soap the night before surgery and the                                morning of Surgery.  2.  If you choose to wash your hair, wash your hair first as usual with your       normal shampoo.  3.  After you shampoo, rinse your hair and body thoroughly to remove the                      Shampoo.  4.  Use CHG as you would any other liquid soap.  You can apply chg directly       to the skin and wash gently with scrungie or a clean washcloth.  5.  Apply the CHG Soap to your body ONLY FROM THE NECK DOWN.        Do not use on open wounds or open sores.  Avoid contact with your eyes,       ears, mouth and genitals (private parts).  Wash genitals (private parts)       with your normal soap.  6.  Wash thoroughly, paying special  attention to the area where your surgery        will be performed.  7.  Thoroughly rinse your body with warm water from the neck down.  8.  DO NOT shower/wash with your normal soap after using and rinsing off       the CHG Soap.  9.  Pat yourself dry with a clean towel.            10.  Wear clean pajamas.            11.  Place clean sheets on your bed the night of your first shower and do not        sleep with pets.  Day of Surgery  Do not apply any lotions/deoderants the morning of surgery.  Please wear clean clothes to the hospital/surgery center.    Please read over the following fact sheets that you were given. Pain Booklet, Coughing and Deep Breathing, MRSA Information and Surgical Site Infection Prevention

## 2015-01-14 ENCOUNTER — Ambulatory Visit (HOSPITAL_COMMUNITY)
Admission: RE | Admit: 2015-01-14 | Discharge: 2015-01-14 | Disposition: A | Discharge status: Home / Self Care | Payer: Managed Care, Other (non HMO) | Source: Ambulatory Visit | Attending: Orthopedic Surgery | Admitting: Orthopedic Surgery

## 2015-01-14 ENCOUNTER — Encounter (HOSPITAL_COMMUNITY)
Admission: RE | Admit: 2015-01-14 | Discharge: 2015-01-14 | Disposition: A | Discharge status: Home / Self Care | Payer: Managed Care, Other (non HMO) | Source: Ambulatory Visit | Attending: Orthopedic Surgery | Admitting: Orthopedic Surgery

## 2015-01-14 ENCOUNTER — Encounter (HOSPITAL_COMMUNITY): Payer: Self-pay

## 2015-01-14 DIAGNOSIS — Z01812 Encounter for preprocedural laboratory examination: Secondary | ICD-10-CM | POA: Insufficient documentation

## 2015-01-14 DIAGNOSIS — M1712 Unilateral primary osteoarthritis, left knee: Secondary | ICD-10-CM | POA: Insufficient documentation

## 2015-01-14 DIAGNOSIS — Z01818 Encounter for other preprocedural examination: Secondary | ICD-10-CM

## 2015-01-14 HISTORY — DX: Pain in unspecified joint: M25.50

## 2015-01-14 HISTORY — DX: Effusion, unspecified joint: M25.40

## 2015-01-14 HISTORY — DX: Headache: R51

## 2015-01-14 HISTORY — DX: Unspecified osteoarthritis, unspecified site: M19.90

## 2015-01-14 HISTORY — DX: Unspecified macular degeneration: H35.30

## 2015-01-14 HISTORY — DX: Headache, unspecified: R51.9

## 2015-01-14 LAB — COMPREHENSIVE METABOLIC PANEL
ALBUMIN: 4.2 g/dL (ref 3.5–5.0)
ALT: 30 U/L (ref 17–63)
AST: 23 U/L (ref 15–41)
Alkaline Phosphatase: 48 U/L (ref 38–126)
Anion gap: 6 (ref 5–15)
BUN: 16 mg/dL (ref 6–20)
CHLORIDE: 106 mmol/L (ref 101–111)
CO2: 29 mmol/L (ref 22–32)
CREATININE: 1.12 mg/dL (ref 0.61–1.24)
Calcium: 9.4 mg/dL (ref 8.9–10.3)
GFR calc Af Amer: >60 mL/min (ref >60)
GFR calc non Af Amer: >60 mL/min (ref >60)
Glucose, Bld: 106 mg/dL — ABNORMAL HIGH (ref 65–99)
POTASSIUM: 4.4 mmol/L (ref 3.5–5.1)
SODIUM: 141 mmol/L (ref 135–145)
Total Bilirubin: 0.9 mg/dL (ref 0.3–1.2)
Total Protein: 6.5 g/dL (ref 6.5–8.1)

## 2015-01-14 LAB — APTT: APTT: 26 s (ref 24–37)

## 2015-01-14 LAB — URINALYSIS, ROUTINE W REFLEX MICROSCOPIC
BILIRUBIN URINE: NEGATIVE
Glucose, UA: NEGATIVE mg/dL
Hgb urine dipstick: NEGATIVE
KETONES UR: NEGATIVE mg/dL
LEUKOCYTES UA: NEGATIVE
NITRITE: NEGATIVE
PH: 6.5 (ref 5.0–8.0)
Protein, ur: NEGATIVE mg/dL
SPECIFIC GRAVITY, URINE: 1.02 (ref 1.005–1.030)
UROBILINOGEN UA: 1 mg/dL (ref 0.0–1.0)

## 2015-01-14 LAB — CBC WITH DIFFERENTIAL/PLATELET
BASOS ABS: 0 10*3/uL (ref 0.0–0.1)
BASOS PCT: 1 %
EOS ABS: 0.1 10*3/uL (ref 0.0–0.7)
EOS PCT: 2 %
HCT: 48.1 % (ref 39.0–52.0)
Hemoglobin: 16.1 g/dL (ref 13.0–17.0)
LYMPHS PCT: 24 %
Lymphs Abs: 1.5 10*3/uL (ref 0.7–4.0)
MCH: 31.4 pg (ref 26.0–34.0)
MCHC: 33.5 g/dL (ref 30.0–36.0)
MCV: 93.9 fL (ref 78.0–100.0)
Monocytes Absolute: 0.4 10*3/uL (ref 0.1–1.0)
Monocytes Relative: 7 %
Neutro Abs: 4 10*3/uL (ref 1.7–7.7)
Neutrophils Relative %: 66 %
PLATELETS: 208 10*3/uL (ref 150–400)
RBC: 5.12 MIL/uL (ref 4.22–5.81)
RDW: 13 % (ref 11.5–15.5)
WBC: 6 10*3/uL (ref 4.0–10.5)

## 2015-01-14 LAB — PROTIME-INR
INR: 1.06 (ref 0.00–1.49)
Prothrombin Time: 14 seconds (ref 11.6–15.2)

## 2015-01-14 LAB — SURGICAL PCR SCREEN
MRSA, PCR: NEGATIVE
STAPHYLOCOCCUS AUREUS: NEGATIVE

## 2015-01-14 MED ORDER — CHLORHEXIDINE GLUCONATE 4 % EX LIQD: 60.0000 mL | Freq: Once | CUTANEOUS | Status: DC

## 2015-01-14 NOTE — Progress Notes (Signed)
   01/14/15 1027  Stephen Sosa  Have you ever been diagnosed with sleep apnea through a sleep study? No  Do you snore loudly (loud enough to be heard through closed doors)?  1  Do you often feel tired, fatigued, or sleepy during the daytime (such as falling asleep during driving or talking to someone)? 0  Has anyone observed you stop breathing during your sleep? 0  Do you have, or are you being treated for high blood pressure? 0  BMI more than 35 kg/m2? 0  Age > 50 (1-yes) 1  Neck circumference greater than:Male 16 inches or larger, Male 17inches or larger? 1  Male Gender (Yes=1) 1  Obstructive Sleep Apnea Score 4  Score 5 or greater  Results sent to PCP

## 2015-01-14 NOTE — Progress Notes (Addendum)
Cardiologist denies having one  Medical Md is Dr.Richard Rosanna Randy  Echo 15+ yrs ago  Stress test 15+ yrs ago  Heart cath denies ever having one  EKG in epic from 12-24-14  CXR denies having one in past yr

## 2015-01-15 LAB — URINE CULTURE: CULTURE: NO GROWTH

## 2015-01-24 MED ORDER — TRANEXAMIC ACID 1000 MG/10ML IV SOLN
1000.0000 mg | INTRAVENOUS | Status: AC
  Administered 2015-01-25: 1000 mg via INTRAVENOUS
  Filled 2015-01-24: qty 10

## 2015-01-24 MED ORDER — BUPIVACAINE LIPOSOME 1.3 % IJ SUSP
20.0000 mL | INTRAMUSCULAR | Status: DC
  Filled 2015-01-24: qty 20

## 2015-01-25 ENCOUNTER — Inpatient Hospital Stay (HOSPITAL_COMMUNITY): Payer: Managed Care, Other (non HMO) | Admitting: Certified Registered Nurse Anesthetist

## 2015-01-25 ENCOUNTER — Encounter (HOSPITAL_COMMUNITY): Payer: Self-pay | Admitting: Surgery

## 2015-01-25 ENCOUNTER — Encounter (HOSPITAL_COMMUNITY)
Admission: RE | Disposition: A | Discharge status: Home Health | Payer: Self-pay | Source: Ambulatory Visit | Attending: Orthopedic Surgery

## 2015-01-25 ENCOUNTER — Inpatient Hospital Stay (HOSPITAL_COMMUNITY)
Admission: RE | Admit: 2015-01-25 | Discharge: 2015-01-27 | DRG: 470 | Disposition: A | Discharge status: Home Health | Payer: Managed Care, Other (non HMO) | Source: Ambulatory Visit | Attending: Orthopedic Surgery | Admitting: Orthopedic Surgery

## 2015-01-25 DIAGNOSIS — Z96651 Presence of right artificial knee joint: Secondary | ICD-10-CM | POA: Diagnosis present

## 2015-01-25 DIAGNOSIS — H3530 Unspecified macular degeneration: Secondary | ICD-10-CM | POA: Diagnosis present

## 2015-01-25 DIAGNOSIS — D62 Acute posthemorrhagic anemia: Secondary | ICD-10-CM | POA: Diagnosis not present

## 2015-01-25 DIAGNOSIS — Z79899 Other long term (current) drug therapy: Secondary | ICD-10-CM | POA: Diagnosis not present

## 2015-01-25 DIAGNOSIS — Z91048 Other nonmedicinal substance allergy status: Secondary | ICD-10-CM | POA: Diagnosis not present

## 2015-01-25 DIAGNOSIS — Z91030 Bee allergy status: Secondary | ICD-10-CM | POA: Diagnosis not present

## 2015-01-25 DIAGNOSIS — M1712 Unilateral primary osteoarthritis, left knee: Principal | ICD-10-CM | POA: Diagnosis present

## 2015-01-25 DIAGNOSIS — Z96659 Presence of unspecified artificial knee joint: Secondary | ICD-10-CM

## 2015-01-25 HISTORY — DX: Phlebitis and thrombophlebitis of unspecified site: I80.9

## 2015-01-25 HISTORY — PX: TOTAL KNEE ARTHROPLASTY: SHX125

## 2015-01-25 LAB — CBC
HEMATOCRIT: 45.6 % (ref 39.0–52.0)
Hemoglobin: 15.6 g/dL (ref 13.0–17.0)
MCH: 31.8 pg (ref 26.0–34.0)
MCHC: 34.2 g/dL (ref 30.0–36.0)
MCV: 93.1 fL (ref 78.0–100.0)
PLATELETS: 190 10*3/uL (ref 150–400)
RBC: 4.9 MIL/uL (ref 4.22–5.81)
RDW: 12.8 % (ref 11.5–15.5)
WBC: 14 10*3/uL — ABNORMAL HIGH (ref 4.0–10.5)

## 2015-01-25 LAB — CREATININE, SERUM
Creatinine, Ser: 1.16 mg/dL (ref 0.61–1.24)
GFR calc Af Amer: >60 mL/min (ref >60)
GFR calc non Af Amer: >60 mL/min (ref >60)

## 2015-01-25 SURGERY — ARTHROPLASTY, KNEE, TOTAL
Anesthesia: Spinal | Site: Knee | Laterality: Left

## 2015-01-25 MED ORDER — FLEET ENEMA 7-19 GM/118ML RE ENEM
1.0000 | ENEMA | Freq: Once | RECTAL | Status: DC | PRN
Start: 2015-01-25 — End: 2015-01-27

## 2015-01-25 MED ORDER — SENNOSIDES-DOCUSATE SODIUM 8.6-50 MG PO TABS: 1.0000 | ORAL_TABLET | Freq: Every evening | ORAL | Status: DC | PRN

## 2015-01-25 MED ORDER — SODIUM CHLORIDE 0.9 % IV SOLN
INTRAVENOUS | Status: DC
  Administered 2015-01-26: 04:00:00 via INTRAVENOUS

## 2015-01-25 MED ORDER — MIDAZOLAM HCL 2 MG/2ML IJ SOLN
INTRAMUSCULAR | Status: DC | PRN
  Administered 2015-01-25 (×2): 1 mg via INTRAVENOUS

## 2015-01-25 MED ORDER — BUPIVACAINE LIPOSOME 1.3 % IJ SUSP
INTRAMUSCULAR | Status: DC | PRN
  Administered 2015-01-25: 20 mL

## 2015-01-25 MED ORDER — DIPHENHYDRAMINE HCL 12.5 MG/5ML PO ELIX: 12.5000 mg | ORAL_SOLUTION | ORAL | Status: DC | PRN

## 2015-01-25 MED ORDER — OXYCODONE HCL ER 10 MG PO T12A: 10.0000 mg | EXTENDED_RELEASE_TABLET | Freq: Two times a day (BID) | ORAL | Status: DC

## 2015-01-25 MED ORDER — OXYCODONE HCL 5 MG PO TABS
5.0000 mg | ORAL_TABLET | ORAL | Status: DC | PRN
  Administered 2015-01-25: 10 mg via ORAL
  Administered 2015-01-25: 5 mg via ORAL
  Administered 2015-01-26 – 2015-01-27 (×10): 10 mg via ORAL
  Filled 2015-01-25 (×11): qty 2

## 2015-01-25 MED ORDER — OXYCODONE HCL ER 10 MG PO T12A
20.0000 mg | EXTENDED_RELEASE_TABLET | Freq: Two times a day (BID) | ORAL | Status: DC
  Administered 2015-01-25 – 2015-01-26 (×2): 20 mg via ORAL
  Filled 2015-01-25 (×2): qty 2

## 2015-01-25 MED ORDER — MIDAZOLAM HCL 2 MG/2ML IJ SOLN
INTRAMUSCULAR | Status: AC
  Filled 2015-01-25: qty 4

## 2015-01-25 MED ORDER — CEFAZOLIN SODIUM-DEXTROSE 2-3 GM-% IV SOLR
2.0000 g | Freq: Four times a day (QID) | INTRAVENOUS | Status: AC
  Administered 2015-01-25 (×2): 2 g via INTRAVENOUS
  Filled 2015-01-25 (×2): qty 50

## 2015-01-25 MED ORDER — ONDANSETRON HCL 4 MG/2ML IJ SOLN
4.0000 mg | Freq: Four times a day (QID) | INTRAMUSCULAR | Status: DC | PRN
  Administered 2015-01-25 (×2): 4 mg via INTRAVENOUS
  Filled 2015-01-25 (×3): qty 2

## 2015-01-25 MED ORDER — ALUM & MAG HYDROXIDE-SIMETH 200-200-20 MG/5ML PO SUSP
30.0000 mL | ORAL | Status: DC | PRN
  Administered 2015-01-26: 30 mL via ORAL
  Filled 2015-01-25: qty 30

## 2015-01-25 MED ORDER — HYDROMORPHONE HCL 1 MG/ML IJ SOLN
1.0000 mg | INTRAMUSCULAR | Status: DC | PRN
  Administered 2015-01-25 – 2015-01-26 (×8): 1 mg via INTRAVENOUS
  Filled 2015-01-25 (×10): qty 1

## 2015-01-25 MED ORDER — METHOCARBAMOL 500 MG PO TABS
500.0000 mg | ORAL_TABLET | Freq: Four times a day (QID) | ORAL | Status: DC | PRN
  Administered 2015-01-25 – 2015-01-27 (×9): 500 mg via ORAL
  Filled 2015-01-25 (×9): qty 1

## 2015-01-25 MED ORDER — GLYCOPYRROLATE 0.2 MG/ML IJ SOLN
INTRAMUSCULAR | Status: DC | PRN
  Administered 2015-01-25 (×2): 0.1 mg via INTRAVENOUS

## 2015-01-25 MED ORDER — ONDANSETRON HCL 4 MG/2ML IJ SOLN
INTRAMUSCULAR | Status: DC | PRN
Start: 2015-01-25 — End: 2015-01-25
  Administered 2015-01-25: 4 mg via INTRAVENOUS

## 2015-01-25 MED ORDER — CELECOXIB 200 MG PO CAPS
200.0000 mg | ORAL_CAPSULE | Freq: Two times a day (BID) | ORAL | Status: DC
  Administered 2015-01-25 – 2015-01-27 (×4): 200 mg via ORAL
  Filled 2015-01-25 (×4): qty 1

## 2015-01-25 MED ORDER — BUPIVACAINE-EPINEPHRINE (PF) 0.5% -1:200000 IJ SOLN
INTRAMUSCULAR | Status: AC
  Filled 2015-01-25: qty 30

## 2015-01-25 MED ORDER — FENTANYL CITRATE (PF) 250 MCG/5ML IJ SOLN
INTRAMUSCULAR | Status: DC | PRN
  Administered 2015-01-25 (×2): 50 ug via INTRAVENOUS

## 2015-01-25 MED ORDER — PROPOFOL 10 MG/ML IV BOLUS
INTRAVENOUS | Status: DC | PRN
  Administered 2015-01-25: 20 mg via INTRAVENOUS
  Administered 2015-01-25: 30 mg via INTRAVENOUS
  Administered 2015-01-25 (×3): 20 mg via INTRAVENOUS
  Administered 2015-01-25: 30 mg via INTRAVENOUS
  Administered 2015-01-25: 20 mg via INTRAVENOUS

## 2015-01-25 MED ORDER — HYDROMORPHONE HCL 1 MG/ML IJ SOLN: 0.2500 mg | INTRAMUSCULAR | Status: DC | PRN

## 2015-01-25 MED ORDER — PROPOFOL 500 MG/50ML IV EMUL
INTRAVENOUS | Status: DC | PRN
  Administered 2015-01-25: 25 ug/kg/min via INTRAVENOUS

## 2015-01-25 MED ORDER — HYDROMORPHONE HCL 1 MG/ML IJ SOLN
0.2500 mg | INTRAMUSCULAR | Status: DC | PRN
  Administered 2015-01-25 (×4): 0.5 mg via INTRAVENOUS

## 2015-01-25 MED ORDER — ZOLPIDEM TARTRATE 5 MG PO TABS: 5.0000 mg | ORAL_TABLET | Freq: Every evening | ORAL | Status: DC | PRN

## 2015-01-25 MED ORDER — ENOXAPARIN SODIUM 30 MG/0.3ML ~~LOC~~ SOLN
30.0000 mg | Freq: Two times a day (BID) | SUBCUTANEOUS | Status: DC
  Administered 2015-01-26 – 2015-01-27 (×3): 30 mg via SUBCUTANEOUS
  Filled 2015-01-25 (×3): qty 0.3

## 2015-01-25 MED ORDER — EPHEDRINE SULFATE 50 MG/ML IJ SOLN
INTRAMUSCULAR | Status: DC | PRN
  Administered 2015-01-25: 5 mg via INTRAVENOUS
  Administered 2015-01-25: 10 mg via INTRAVENOUS
  Administered 2015-01-25 (×2): 5 mg via INTRAVENOUS

## 2015-01-25 MED ORDER — FENTANYL CITRATE (PF) 250 MCG/5ML IJ SOLN
INTRAMUSCULAR | Status: AC
  Filled 2015-01-25: qty 5

## 2015-01-25 MED ORDER — SODIUM CHLORIDE 0.9 % IV SOLN: INTRAVENOUS | Status: DC

## 2015-01-25 MED ORDER — ONDANSETRON HCL 4 MG PO TABS
4.0000 mg | ORAL_TABLET | Freq: Four times a day (QID) | ORAL | Status: DC | PRN
  Filled 2015-01-25: qty 1

## 2015-01-25 MED ORDER — ONDANSETRON HCL 4 MG/2ML IJ SOLN
INTRAMUSCULAR | Status: AC
  Filled 2015-01-25: qty 2

## 2015-01-25 MED ORDER — DOCUSATE SODIUM 100 MG PO CAPS
100.0000 mg | ORAL_CAPSULE | Freq: Two times a day (BID) | ORAL | Status: DC
  Administered 2015-01-25 – 2015-01-27 (×4): 100 mg via ORAL
  Filled 2015-01-25 (×4): qty 1

## 2015-01-25 MED ORDER — BUPIVACAINE IN DEXTROSE 0.75-8.25 % IT SOLN
INTRATHECAL | Status: DC | PRN
  Administered 2015-01-25: 1.8 mL via INTRATHECAL

## 2015-01-25 MED ORDER — LIDOCAINE HCL (CARDIAC) 20 MG/ML IV SOLN
INTRAVENOUS | Status: DC | PRN
  Administered 2015-01-25: 60 mg via INTRAVENOUS

## 2015-01-25 MED ORDER — CEFAZOLIN SODIUM-DEXTROSE 2-3 GM-% IV SOLR
2.0000 g | INTRAVENOUS | Status: AC
  Administered 2015-01-25: 2 g via INTRAVENOUS
  Filled 2015-01-25: qty 50

## 2015-01-25 MED ORDER — SODIUM CHLORIDE 0.9 % IJ SOLN
INTRAMUSCULAR | Status: DC | PRN
  Administered 2015-01-25: 20 mL

## 2015-01-25 MED ORDER — PHENOL 1.4 % MT LIQD: 1.0000 | OROMUCOSAL | Status: DC | PRN

## 2015-01-25 MED ORDER — METOCLOPRAMIDE HCL 5 MG PO TABS
5.0000 mg | ORAL_TABLET | Freq: Three times a day (TID) | ORAL | Status: DC | PRN
  Filled 2015-01-25: qty 2

## 2015-01-25 MED ORDER — HYDROMORPHONE HCL 1 MG/ML IJ SOLN
INTRAMUSCULAR | Status: AC
  Filled 2015-01-25: qty 1

## 2015-01-25 MED ORDER — ACETAMINOPHEN 325 MG PO TABS: 650.0000 mg | ORAL_TABLET | Freq: Four times a day (QID) | ORAL | Status: DC | PRN

## 2015-01-25 MED ORDER — LACTATED RINGERS IV SOLN
INTRAVENOUS | Status: DC | PRN
  Administered 2015-01-25 (×2): via INTRAVENOUS

## 2015-01-25 MED ORDER — MENTHOL 3 MG MT LOZG: 1.0000 | LOZENGE | OROMUCOSAL | Status: DC | PRN

## 2015-01-25 MED ORDER — PROMETHAZINE HCL 25 MG/ML IJ SOLN: 6.2500 mg | INTRAMUSCULAR | Status: DC | PRN

## 2015-01-25 MED ORDER — BISACODYL 5 MG PO TBEC: 5.0000 mg | DELAYED_RELEASE_TABLET | Freq: Every day | ORAL | Status: DC | PRN

## 2015-01-25 MED ORDER — GLYCOPYRROLATE 0.2 MG/ML IJ SOLN
INTRAMUSCULAR | Status: AC
  Filled 2015-01-25: qty 1

## 2015-01-25 MED ORDER — METHOCARBAMOL 1000 MG/10ML IJ SOLN
500.0000 mg | Freq: Four times a day (QID) | INTRAVENOUS | Status: DC | PRN
  Filled 2015-01-25 (×2): qty 5

## 2015-01-25 MED ORDER — PROPOFOL 10 MG/ML IV BOLUS
INTRAVENOUS | Status: AC
  Filled 2015-01-25: qty 20

## 2015-01-25 MED ORDER — SODIUM CHLORIDE 0.9 % IR SOLN
Status: DC | PRN
Start: 2015-01-25 — End: 2015-01-25
  Administered 2015-01-25: 1000 mL

## 2015-01-25 MED ORDER — METHOCARBAMOL 500 MG PO TABS
ORAL_TABLET | ORAL | Status: AC
  Filled 2015-01-25: qty 1

## 2015-01-25 MED ORDER — METOCLOPRAMIDE HCL 5 MG/ML IJ SOLN
5.0000 mg | Freq: Three times a day (TID) | INTRAMUSCULAR | Status: DC | PRN
  Filled 2015-01-25: qty 2

## 2015-01-25 MED ORDER — ACETAMINOPHEN 650 MG RE SUPP: 650.0000 mg | Freq: Four times a day (QID) | RECTAL | Status: DC | PRN

## 2015-01-25 MED ORDER — SODIUM CHLORIDE 0.9 % IV BOLUS (SEPSIS)
500.0000 mL | Freq: Once | INTRAVENOUS | Status: AC
  Administered 2015-01-25: 500 mL via INTRAVENOUS

## 2015-01-25 MED ORDER — OXYCODONE HCL 5 MG PO TABS
ORAL_TABLET | ORAL | Status: AC
  Filled 2015-01-25: qty 1

## 2015-01-25 MED ORDER — BUPIVACAINE-EPINEPHRINE 0.5% -1:200000 IJ SOLN
INTRAMUSCULAR | Status: DC | PRN
  Administered 2015-01-25: 30 mL

## 2015-01-25 MED ORDER — LIDOCAINE HCL (CARDIAC) 20 MG/ML IV SOLN
INTRAVENOUS | Status: AC
  Filled 2015-01-25: qty 5

## 2015-01-25 SURGICAL SUPPLY — 56 items
BANDAGE ESMARK 6X9 LF (GAUZE/BANDAGES/DRESSINGS) ×1 IMPLANT
BLADE SAGITTAL 13X1.27X60 (BLADE) ×2 IMPLANT
BLADE SAW SGTL 83.5X18.5 (BLADE) ×2 IMPLANT
BLADE SURG 10 STRL SS (BLADE) ×2 IMPLANT
BNDG ESMARK 6X9 LF (GAUZE/BANDAGES/DRESSINGS) ×2
BOWL SMART MIX CTS (DISPOSABLE) ×2 IMPLANT
CAPT KNEE TOTAL 3 ×2 IMPLANT
CEMENT BONE SIMPLEX SPEEDSET (Cement) ×4 IMPLANT
COVER SURGICAL LIGHT HANDLE (MISCELLANEOUS) ×2 IMPLANT
CUFF TOURNIQUET SINGLE 34IN LL (TOURNIQUET CUFF) ×2 IMPLANT
DRAPE EXTREMITY T 121X128X90 (DRAPE) ×2 IMPLANT
DRAPE INCISE IOBAN 66X45 STRL (DRAPES) ×4 IMPLANT
DRAPE PROXIMA HALF (DRAPES) IMPLANT
DRAPE U-SHAPE 47X51 STRL (DRAPES) ×2 IMPLANT
DRSG ADAPTIC 3X8 NADH LF (GAUZE/BANDAGES/DRESSINGS) ×2 IMPLANT
DRSG PAD ABDOMINAL 8X10 ST (GAUZE/BANDAGES/DRESSINGS) ×2 IMPLANT
DURAPREP 26ML APPLICATOR (WOUND CARE) ×4 IMPLANT
ELECT REM PT RETURN 9FT ADLT (ELECTROSURGICAL) ×2
ELECTRODE REM PT RTRN 9FT ADLT (ELECTROSURGICAL) ×1 IMPLANT
GAUZE SPONGE 4X4 12PLY STRL (GAUZE/BANDAGES/DRESSINGS) ×2 IMPLANT
GLOVE BIOGEL M 7.0 STRL (GLOVE) IMPLANT
GLOVE BIOGEL PI IND STRL 7.5 (GLOVE) IMPLANT
GLOVE BIOGEL PI IND STRL 8.5 (GLOVE) ×2 IMPLANT
GLOVE BIOGEL PI INDICATOR 7.5 (GLOVE)
GLOVE BIOGEL PI INDICATOR 8.5 (GLOVE) ×2
GLOVE SURG ORTHO 8.0 STRL STRW (GLOVE) ×4 IMPLANT
GOWN STRL REUS W/ TWL LRG LVL3 (GOWN DISPOSABLE) ×1 IMPLANT
GOWN STRL REUS W/ TWL XL LVL3 (GOWN DISPOSABLE) ×2 IMPLANT
GOWN STRL REUS W/TWL LRG LVL3 (GOWN DISPOSABLE) ×1
GOWN STRL REUS W/TWL XL LVL3 (GOWN DISPOSABLE) ×2
HANDPIECE INTERPULSE COAX TIP (DISPOSABLE) ×1
HOOD PEEL AWAY FACE SHEILD DIS (HOOD) ×6 IMPLANT
KIT BASIN OR (CUSTOM PROCEDURE TRAY) ×2 IMPLANT
KIT ROOM TURNOVER OR (KITS) ×2 IMPLANT
KNEE CAPITATED TOTAL 3 ×1 IMPLANT
MANIFOLD NEPTUNE II (INSTRUMENTS) ×2 IMPLANT
NEEDLE 22X1 1/2 (OR ONLY) (NEEDLE) ×4 IMPLANT
NS IRRIG 1000ML POUR BTL (IV SOLUTION) ×2 IMPLANT
PACK TOTAL JOINT (CUSTOM PROCEDURE TRAY) ×2 IMPLANT
PACK UNIVERSAL I (CUSTOM PROCEDURE TRAY) ×2 IMPLANT
PAD ARMBOARD 7.5X6 YLW CONV (MISCELLANEOUS) ×4 IMPLANT
PADDING CAST COTTON 6X4 STRL (CAST SUPPLIES) ×2 IMPLANT
SET HNDPC FAN SPRY TIP SCT (DISPOSABLE) ×1 IMPLANT
SPONGE GAUZE 4X4 12PLY STER LF (GAUZE/BANDAGES/DRESSINGS) ×2 IMPLANT
STAPLER VISISTAT 35W (STAPLE) ×2 IMPLANT
SUCTION FRAZIER TIP 10 FR DISP (SUCTIONS) ×2 IMPLANT
SUT BONE WAX W31G (SUTURE) ×2 IMPLANT
SUT VIC AB 0 CTB1 27 (SUTURE) ×4 IMPLANT
SUT VIC AB 1 CT1 27 (SUTURE) ×2
SUT VIC AB 1 CT1 27XBRD ANBCTR (SUTURE) ×2 IMPLANT
SUT VIC AB 2-0 CT1 27 (SUTURE) ×2
SUT VIC AB 2-0 CT1 TAPERPNT 27 (SUTURE) ×2 IMPLANT
SYR 20CC LL (SYRINGE) ×4 IMPLANT
TOWEL OR 17X24 6PK STRL BLUE (TOWEL DISPOSABLE) ×2 IMPLANT
TOWEL OR 17X26 10 PK STRL BLUE (TOWEL DISPOSABLE) ×2 IMPLANT
WATER STERILE IRR 1000ML POUR (IV SOLUTION) ×4 IMPLANT

## 2015-01-25 NOTE — Progress Notes (Signed)
Report given to angel rn as caregiver 

## 2015-01-25 NOTE — Progress Notes (Signed)
Called [facility]. Waiting on them to call back

## 2015-01-25 NOTE — Anesthesia Procedure Notes (Signed)
Spinal Patient location during procedure: OR Staffing Performed by: anesthesiologist  Preanesthetic Checklist Completed: patient identified, site marked, surgical consent, pre-op evaluation, timeout performed, IV checked, risks and benefits discussed and monitors and equipment checked Spinal Block Patient position: sitting Prep: Betadine Patient monitoring: heart rate, continuous pulse ox and blood pressure Injection technique: single-shot Needle Needle type: Sprotte  Needle gauge: 24 G Needle length: 9 cm Additional Notes Expiration date of kit checked and confirmed. Patient tolerated procedure well, without complications.

## 2015-01-25 NOTE — Progress Notes (Signed)
I&O cath performed per Dr.Rose. 725 cc clear/ yellow urine return. Pt's lower abd discomfort relieved.

## 2015-01-25 NOTE — Progress Notes (Signed)
Pt c/o lower abd discomfort. Bladder scanner show 826cc. Dr.Rose notified. I&O cath attempted but unsuccessful-meeting resistance. Dr.Rose notified. States to attempt a second time and if unsuccessful to consult urology.

## 2015-01-25 NOTE — Progress Notes (Signed)
Report to Shirley RN for lunch relief. 

## 2015-01-25 NOTE — H&P (Signed)
Stephen Sosa. MRN:  [id number] DOB/SEX:  Jan 10, 1956/male  CHIEF COMPLAINT:  Painful left Knee  HISTORY: Patient is a 59 y.o. male presented with a history of pain in the left knee. Onset of symptoms was gradual starting several years ago with gradually worsening course since that time. Prior procedures on the knee include arthroscopy. Patient has been treated conservatively with over-the-counter NSAIDs and activity modification. Patient currently rates pain in the knee at 10 out of 10 with activity. There is pain at night.  PAST MEDICAL HISTORY: Patient Active Problem List   Diagnosis Date Noted  . Arthritis 12/24/2014   Past Medical History  Diagnosis Date  . Headache     rarely  . Arthritis   . Joint pain   . Joint swelling   . Macular degeneration     dry  . Phlebitis    Past Surgical History  Procedure Laterality Date  . Varicose vein surgery Bilateral 2003    ligation and stripping  . Knee arthroscopy Left   . Joint replacement Right 2005    knee  . Hernia repair  [id number]    umbilical hernia     MEDICATIONS:   Prescriptions prior to admission  Medication Sig Dispense Refill Last Dose  . meloxicam (MOBIC) 15 MG tablet Take 15 mg by mouth daily.    Past Month at Unknown time    ALLERGIES:   Allergies  Allergen Reactions  . Bee Venom Shortness Of Breath and Swelling  . Adhesive [Tape]     Redness and raw      REVIEW OF SYSTEMS:  Pertinent items noted in HPI and remainder of comprehensive ROS otherwise negative.   FAMILY HISTORY:   Family History  Problem Relation Age of Onset  . Dementia Father     SOCIAL HISTORY:   Social History  Substance Use Topics  . Smoking status: Never Smoker   . Smokeless tobacco: Not on file  . Alcohol Use: Yes     Comment: 1-2 beer daily     EXAMINATION:  Vital signs in last 24 hours: Temp:  [98.9 F (37.2 C)] 98.9 F (37.2 C) (10/10 0606) Pulse Rate:  [65] 65 (10/10 0606) Resp:  [18] 18 (10/10 0606) BP:  (134)/(75) 134/75 mmHg (10/10 0606) SpO2:  [98 %] 98 % (10/10 0606) Weight:  [92.023 kg (202 lb 14 oz)] 92.023 kg (202 lb 14 oz) (10/10 0606)  General appearance: alert, cooperative and no distress Lungs: clear to auscultation bilaterally Heart: regular rate and rhythm, S1, S2 normal, no murmur, click, rub or gallop Abdomen: soft, non-tender; bowel sounds normal; no masses,  no organomegaly Extremities: extremities normal, atraumatic, no cyanosis or edema and Homans sign is negative, no sign of DVT Pulses: 2+ and symmetric Skin: Skin color, texture, turgor normal. No rashes or lesions Neurologic: Alert and oriented X 3, normal strength and tone. Normal symmetric reflexes. Normal coordination and gait  Musculoskeletal:  ROM 0-115, Ligaments intact,  Imaging Review Plain radiographs demonstrate severe degenerative joint disease of the left knee. The overall alignment is significant varus. The bone quality appears to be good for age and reported activity level.  Assessment/Plan: Primary osteoarthritis, left knee   The patient history, physical examination and imaging studies are consistent with advanced degenerative joint disease of the left knee. The patient has failed conservative treatment.  The clearance notes were reviewed.  After discussion with the patient it was felt that Total Knee Replacement was indicated. The procedure,  risks, and benefits  of total knee arthroplasty were presented and reviewed. The risks including but not limited to aseptic loosening, infection, blood clots, vascular injury, stiffness, patella tracking problems complications among others were discussed. The patient acknowledged the explanation, agreed to proceed with the plan.  Stephen Sosa 01/25/2015, 6:36 AM

## 2015-01-25 NOTE — Progress Notes (Signed)
Orthopedic Tech Progress Note Patient Details:  Stephen Sosa 13-Feb-1956 [id number] Viewed order from doctor's order list CPM Left Knee CPM Left Knee: On Left Knee Flexion (Degrees): 90 Left Knee Extension (Degrees): 0 Additional Comments: trapeze bar patient helper   [name] 01/25/2015, 9:57 AM

## 2015-01-25 NOTE — Anesthesia Postprocedure Evaluation (Signed)
  Anesthesia Post-op Note  Patient: Stephen Sosa.  Procedure(s) Performed: Procedure(s) (LRB): TOTAL KNEE ARTHROPLASTY (Left)  Patient Location: PACU  Anesthesia Type: Spinal  Level of Consciousness: awake and alert   Airway and Oxygen Therapy: Patient Spontanous Breathing  Post-op Pain: mild  Post-op Assessment: Post-op Vital signs reviewed, Patient's Cardiovascular Status Stable, Respiratory Function Stable, Patent Airway and No signs of Nausea or vomiting  Last Vitals:  Filed Vitals:   01/25/15 0945  BP: 123/69  Pulse: 63  Temp:   Resp:     Post-op Vital Signs: stable   Complications: No apparent anesthesia complications

## 2015-01-25 NOTE — Anesthesia Preprocedure Evaluation (Signed)
Anesthesia Evaluation  Patient identified by MRN, date of birth, ID band Patient awake    Reviewed: Allergy & Precautions, NPO status , Patient's Chart, lab work & pertinent test results  Airway Mallampati: II  TM Distance: >3 FB Neck ROM: Full    Dental no notable dental hx.    Pulmonary neg pulmonary ROS,    Pulmonary exam normal breath sounds clear to auscultation       Cardiovascular negative cardio ROS Normal cardiovascular exam Rhythm:Regular Rate:Normal     Neuro/Psych negative neurological ROS  negative psych ROS   GI/Hepatic negative GI ROS, Neg liver ROS,   Endo/Other  negative endocrine ROS  Renal/GU negative Renal ROS  negative genitourinary   Musculoskeletal negative musculoskeletal ROS (+)   Abdominal   Peds negative pediatric ROS (+)  Hematology negative hematology ROS (+)   Anesthesia Other Findings   Reproductive/Obstetrics negative OB ROS                             Anesthesia Physical Anesthesia Plan  ASA: I  Anesthesia Plan: Spinal   Post-op Pain Management:    Induction: Intravenous  Airway Management Planned: Simple Face Mask  Additional Equipment:   Intra-op Plan:   Post-operative Plan:   Informed Consent: I have reviewed the patients History and Physical, chart, labs and discussed the procedure including the risks, benefits and alternatives for the proposed anesthesia with the patient or authorized representative who has indicated his/her understanding and acceptance.   Dental advisory given  Plan Discussed with: CRNA and Surgeon  Anesthesia Plan Comments:         Anesthesia Quick Evaluation

## 2015-01-25 NOTE — Progress Notes (Signed)
Patient set up with [facility] for HHPT by MD office but informed by [facility] they do not work with his insurance. Contacted [facility], third party that sets up home health for patient's insurance, requested HHPT be set up with anticipated start date of 01/28/15. Faxed demographic sheet and HHPT order to [phone]. Intake ID# is [id number]. Request for HHPT initiated today due agency's usual turn around time is several days. Will continue to follow for discharge needs and to assure HHPT is set up for patient.

## 2015-01-25 NOTE — Transfer of Care (Signed)
Immediate Anesthesia Transfer of Care Note  Patient: Stephen Sosa.  Procedure(s) Performed: Procedure(s): TOTAL KNEE ARTHROPLASTY (Left)  Patient Location: PACU  Anesthesia Type:Spinal  Level of Consciousness: awake, alert , oriented and patient cooperative  Airway & Oxygen Therapy: Patient Spontanous Breathing and Patient connected to nasal cannula oxygen  Post-op Assessment: Report given to RN, Post -op Vital signs reviewed and stable and Patient moving all extremities  Post vital signs: Reviewed and stable  Last Vitals:  Filed Vitals:   01/25/15 0606  BP: 134/75  Pulse: 65  Temp: 37.2 C  Resp: 18    Complications: No apparent anesthesia complications

## 2015-01-26 ENCOUNTER — Encounter (HOSPITAL_COMMUNITY): Payer: Self-pay | Admitting: Orthopedic Surgery

## 2015-01-26 LAB — CBC
HCT: 42.8 % (ref 39.0–52.0)
Hemoglobin: 14.5 g/dL (ref 13.0–17.0)
MCH: 31.8 pg (ref 26.0–34.0)
MCHC: 33.9 g/dL (ref 30.0–36.0)
MCV: 93.9 fL (ref 78.0–100.0)
PLATELETS: 204 10*3/uL (ref 150–400)
RBC: 4.56 MIL/uL (ref 4.22–5.81)
RDW: 12.9 % (ref 11.5–15.5)
WBC: 13.4 10*3/uL — AB (ref 4.0–10.5)

## 2015-01-26 LAB — BASIC METABOLIC PANEL
Anion gap: 8 (ref 5–15)
BUN: 16 mg/dL (ref 6–20)
CHLORIDE: 103 mmol/L (ref 101–111)
CO2: 28 mmol/L (ref 22–32)
CREATININE: 1.09 mg/dL (ref 0.61–1.24)
Calcium: 8.7 mg/dL — ABNORMAL LOW (ref 8.9–10.3)
GFR calc Af Amer: >60 mL/min (ref >60)
GFR calc non Af Amer: >60 mL/min (ref >60)
Glucose, Bld: 141 mg/dL — ABNORMAL HIGH (ref 65–99)
Potassium: 3.7 mmol/L (ref 3.5–5.1)
SODIUM: 139 mmol/L (ref 135–145)

## 2015-01-26 MED ORDER — OXYCODONE HCL ER 10 MG PO T12A
10.0000 mg | EXTENDED_RELEASE_TABLET | Freq: Two times a day (BID) | ORAL | Status: DC
  Administered 2015-01-26 – 2015-01-27 (×2): 10 mg via ORAL
  Filled 2015-01-26 (×2): qty 1

## 2015-01-26 NOTE — Op Note (Signed)
TOTAL KNEE REPLACEMENT OPERATIVE NOTE:  01/25/2015  1:50 PM  PATIENT:  Stephen Sosa.  59 y.o. male  PRE-OPERATIVE DIAGNOSIS:  primary osteoarthritis left knee  POST-OPERATIVE DIAGNOSIS:  primary osteoarthritis left knee  PROCEDURE:  Procedure(s): TOTAL KNEE ARTHROPLASTY  SURGEON:  Surgeon(s): Vickey Huger, MD  PHYSICIAN ASSISTANT: Carlynn Spry, [facility]  ANESTHESIA:   spinal  DRAINS: Hemovac  SPECIMEN: None  COUNTS:  Correct  TOURNIQUET:   Total Tourniquet Time Documented: Thigh (Left) - 61 minutes Total: Thigh (Left) - 61 minutes   DICTATION:  Indication for procedure:    The patient is a 59 y.o. male who has failed conservative treatment for primary osteoarthritis left knee.  Informed consent was obtained prior to anesthesia. The risks versus benefits of the operation were explain and in a way the patient can, and did, understand.   On the implant demand matching protocol, this patient scored 10.  Therefore, this patient did" "did not receive a polyethylene insert with vitamin E which is a high demand implant.  Description of procedure:     The patient was taken to the operating room and placed under anesthesia.  The patient was positioned in the usual fashion taking care that all body parts were adequately padded and/or protected.  I foley catheter was not placed.  A tourniquet was applied and the leg prepped and draped in the usual sterile fashion.  The extremity was exsanguinated with the esmarch and tourniquet inflated to 350 mmHg.  Pre-operative range of motion was normal.  The knee was in 5 degree of mild varus.  A midline incision approximately 6-7 inches long was made with a #10 blade.  A new blade was used to make a parapatellar arthrotomy going 2-3 cm into the quadriceps tendon, over the patella, and alongside the medial aspect of the patellar tendon.  A synovectomy was then performed with the #10 blade and forceps. I then elevated the deep MCL off the medial  tibial metaphysis subperiosteally around to the semimembranosus attachment.    I everted the patella and used calipers to measure patellar thickness.  I used the reamer to ream down to appropriate thickness to recreate the native thickness.  I then removed excess bone with the rongeur and sagittal saw.  I used the appropriately sized template and drilled the three lug holes.  I then put the trial in place and measured the thickness with the calipers to ensure recreation of the native thickness.  The trial was then removed and the patella subluxed and the knee brought into flexion.  A homan retractor was place to retract and protect the patella and lateral structures.  A Z-retractor was place medially to protect the medial structures.  The extra-medullary alignment system was used to make cut the tibial articular surface perpendicular to the anamotic axis of the tibia and in 3 degrees of posterior slope.  The cut surface and alignment jig was removed.  I then used the intramedullary alignment guide to make a 6 valgus cut on the distal femur.  I then marked out the epicondylar axis on the distal femur.  The posterior condylar axis measured 3 degrees.  I then used the anterior referencing sizer and measured the femur to be a size 9.  The 4-In-1 cutting block was screwed into place in external rotation matching the posterior condylar angle, making our cuts perpendicular to the epicondylar axis.  Anterior, posterior and chamfer cuts were made with the sagittal saw.  The cutting block and cut  pieces were removed.  A lamina spreader was placed in 90 degrees of flexion.  The ACL, PCL, menisci, and posterior condylar osteophytes were removed.  A 12 mm spacer blocked was found to offer good flexion and extension gap balance after mild in degree releasing.   The scoop retractor was then placed and the femoral finishing block was pinned in place.  The small sagittal saw was used as well as the lug drill to finish the  femur.  The block and cut surfaces were removed and the medullary canal hole filled with autograft bone from the cut pieces.  The tibia was delivered forward in deep flexion and external rotation.  A size E tray was selected and pinned into place centered on the medial 1/3 of the tibial tubercle.  The reamer and keel was used to prepare the tibia through the tray.    I then trialed with the size 9 femur, size E tibia, a 12 mm insert and the 32 patella.  I had excellent flexion/extension gap balance, excellent patella tracking.  Flexion was full and beyond 120 degrees; extension was zero.  These components were chosen and the staff opened them to me on the back table while the knee was lavaged copiously and the cement mixed.  The soft tissue was infiltrated with 60cc of exparel 1.3% through a 21 gauge needle.  I cemented in the components and removed all excess cement.  The polyethylene tibial component was snapped into place and the knee placed in extension while cement was hardening.  The capsule was infilltrated with 30cc of .25% Marcaine with epinephrine.  A hemovac was place in the joint exiting superolaterally.  A pain pump was place superomedially superficial to the arthrotomy.  Once the cement was hard, the tourniquet was let down.  Hemostasis was obtained.  The arthrotomy was closed with figure-8 #1 vicryl sutures.  The deep soft tissues were closed with #0 vicryls and the subcuticular layer closed with a running #2-0 vicryl.  The skin was reapproximated and closed with skin staples.  The wound was dressed with xeroform, 4 x4's, 2 ABD sponges, a single layer of webril and a TED stocking.   The patient was then awakened, extubated, and taken to the recovery room in stable condition.  BLOOD LOSS:  300cc DRAINS: 1 hemovac, 1 pain catheter COMPLICATIONS:  None.  PLAN OF CARE: Admit to inpatient   PATIENT DISPOSITION:  PACU - hemodynamically stable.   Delay start of Pharmacological VTE agent  (>24hrs) due to surgical blood loss or risk of bleeding:  not applicable  Please fax a copy of this op note to my office at [phone] (please only include page 1 and 2 of the Case Information op note)

## 2015-01-26 NOTE — Progress Notes (Signed)
Orthopedic Tech Progress Note Patient Details:  Stephen Sosa 07-23-1955 [id number]  Patient ID: Stephen Minium., male   DOB: 06-07-1955, 59 y.o.   MRN: [id number] Placed pt's lle on cpm @0 -35 degrees@1345 ; will increase as pt tolerates; RN notified  [name] 01/26/2015, 1:42 PM

## 2015-01-26 NOTE — Care Management Note (Signed)
Case Management Note  Patient Details  Name: Stephen Sosa. MRN: [id number] Date of Birth: [birth year]  Subjective/Objective:  59 yr old male s/p left total knee arthroplasty.                   Action/Plan:  Case manager received call from [facility] with [facility]. She states that [name] will receive Home Health therapy through [facility], start of care date 01/28/15. Case manager will fax orders and pertinent information to [facility].   Expected Discharge Date:   01/27/15               Expected Discharge Plan:  [place]  In-House Referral:     Discharge planning Services  CM Consult  Post Acute Care Choice:    Choice offered to:     DME Arranged:   3in1, CPM DME Agency:   [facility]  HH Arranged:   PT HH Agency:   [facility]  Status of Service: Completed. Medicare Important Message Given:    Date Medicare IM Given:    Medicare IM give by:    Date Additional Medicare IM Given:    Additional Medicare Important Message give by:     If discussed at [place] of Stay Meetings, dates discussed:    Additional Comments:  Ninfa Meeker, RN 01/26/2015, 11:38 AM

## 2015-01-26 NOTE — Evaluation (Signed)
Occupational Therapy Evaluation Patient Details Name: Stephen Sosa. MRN: [id number] DOB: [birth year] Today's Date: 01/26/2015    History of Present Illness 59 y.o. s/p Lt TKA.  History of Rt knee replacement in 2005.   Clinical Impression   Pt s/p above. Pt independent with ADLs, PTA. Feel pt will benefit from acute OT to increase independence prior to d/c.     Follow Up Recommendations  No OT follow up;Supervision - Intermittent    Equipment Recommendations  None recommended by OT    Recommendations for Other Services       Precautions / Restrictions Precautions Precautions: Knee;Fall Precaution Booklet Issued: No Precaution Comments: educated on knee precautions Restrictions Weight Bearing Restrictions: Yes LLE Weight Bearing: Weight bearing as tolerated      Mobility Bed Mobility Overal bed mobility: Needs Assistance Bed Mobility: Supine to Sit     Supine to sit: Mod assist     General bed mobility comments: assist with LLE and trunk. Cues for technique.  Transfers Overall transfer level: Needs assistance Equipment used: Rolling walker (2 wheeled) Transfers: Sit to/from Stand Sit to Stand: Mod assist         General transfer comment: assist to boost to standing.    Balance  Used RW for stand pivot transfer and sit to stand. Able to let go of walker with one hand and simulate managing clothing with Min guard assist.                                          ADL Overall ADL's : Needs assistance/impaired     Grooming: Set up;Sitting               Lower Body Dressing: Moderate assistance;Sit to/from stand   Toilet Transfer: Stand-pivot;RW (Mod assist-sit to stand from bed; Min guard-stand pivot)   Toileting- Water quality scientist and Hygiene: Min guard;Sit to/from stand       Functional mobility during ADLs: Min guard;Rolling walker (stand pivot) General ADL Comments: Educated on LB dressing technique. Briefly talked  about AE. Educated on safety. Explained benefit of reaching down to left sock as it allows knee to bend.     Vision     Perception     Praxis      Pertinent Vitals/Pain Pain Assessment: 0-10 Pain Score:  (5-6) Pain Location: left knee Pain Descriptors / Indicators: Constant;Dull;Grimacing Pain Intervention(s): Monitored during session;Limited activity within patient's tolerance;Repositioned  O2 around 92 towards end of session on RA-placed pt back on Oxygen.     Hand Dominance     Extremity/Trunk Assessment Upper Extremity Assessment Upper Extremity Assessment: Overall WFL for tasks assessed   Lower Extremity Assessment Lower Extremity Assessment: Defer to PT evaluation       Communication Communication Communication: No difficulties   Cognition Arousal/Alertness: Awake/alert Behavior During Therapy: WFL for tasks assessed/performed Overall Cognitive Status: Within Functional Limits for tasks assessed                     General Comments       Exercises       Shoulder Instructions      Home Living Family/patient expects to be discharged to:: Private residence Living Arrangements: Spouse/significant other;Children (son) Available Help at Discharge: Family;Available 24 hours/day Type of Home: House       Home Layout: Two level;Able to live on main level with bedroom/bathroom  Bathroom Shower/Tub: Tub/shower unit         Home Equipment: Environmental consultant - 2 wheels;Cane - quad;Cane - single point;Bedside commode;Tub bench          Prior Functioning/Environment Level of Independence: Independent             OT Diagnosis: Acute pain   OT Problem List: Decreased strength;Decreased knowledge of precautions;Decreased knowledge of use of DME or AE;Pain;Decreased range of motion;Decreased activity tolerance;Impaired balance (sitting and/or standing)   OT Treatment/Interventions: DME and/or AE instruction;Therapeutic activities;Patient/family  education;Balance training;Self-care/ADL training    OT Goals(Current goals can be found in the care plan section) Acute Rehab OT Goals Patient Stated Goal: not stated OT Goal Formulation: With patient Time For Goal Achievement: 02/02/15 Potential to Achieve Goals: Good ADL Goals Pt Will Perform Lower Body Bathing: sit to/from stand;with set-up;with supervision (with or without AE) Pt Will Perform Lower Body Dressing: sit to/from stand;with set-up;with supervision (with or without AE) Pt Will Transfer to Toilet: with supervision;ambulating (3 in 1 over commode) Pt Will Perform Toileting - Clothing Manipulation and hygiene: with supervision;sit to/from stand  OT Frequency: Min 2X/week   Barriers to D/C:            Co-evaluation              End of Session Equipment Utilized During Treatment: Gait belt;Rolling walker; Oxygen placed back on towards end of session. CPM Left Knee CPM Left Knee: Off   Activity Tolerance: Patient limited by pain Patient left: in chair;with call bell/phone within reach;with family/visitor present   Time: 1017-1040 OT Time Calculation (min): 23 min Charges:  OT General Charges $OT Visit: 1 Procedure OT Evaluation $Initial OT Evaluation Tier I: 1 Procedure G-CodesBenito Mccreedy OTR/L [phone] 01/26/2015, 10:53 AM

## 2015-01-26 NOTE — Progress Notes (Signed)
Physical Therapy Treatment Patient Details Name: Stephen Sosa. MRN: [id number] DOB: Nov 20, 1955 Today's Date: 01/26/2015    History of Present Illness 59 y.o. s/p Lt TKA.  History of Rt knee replacement in 2005.    PT Comments    Pt is POD #1 second session and despite pain he was able to progress gait significantly further down the hallway.  He also seemed less lethargic and more alert during our second session.  I will initiate stair training for possible d/c home after AM session tomorrow.  Follow Up Recommendations  Home health PT;Supervision for mobility/OOB     Equipment Recommendations  None recommended by PT    Recommendations for Other Services   NA     Precautions / Restrictions Precautions Precautions: Knee;Fall Precaution Booklet Issued: No Restrictions LLE Weight Bearing: Weight bearing as tolerated    Mobility  Bed Mobility Overal bed mobility: Needs Assistance Bed Mobility: Supine to Sit     Supine to sit: Min assist     General bed mobility comments: Min assist to help support leg to get to EOB.  Verbal cues for hand placement and sequencing including 1/2 bridge technique.   Transfers Overall transfer level: Needs assistance Equipment used: Rolling walker (2 wheeled) Transfers: Sit to/from Stand Sit to Stand: Min assist         General transfer comment: Min assist to support trunk during transitions.  Verbal cues for safe hand palcement and foot placement as well as speed of trasitions.   Ambulation/Gait Ambulation/Gait assistance: Min guard;Supervision Ambulation Distance (Feet): 180 Feet Assistive device: Rolling walker (2 wheeled) Gait Pattern/deviations: Step-to pattern;Step-through pattern;Antalgic Gait velocity: decerased Gait velocity interpretation: Below normal speed for age/gender General Gait Details: Pt started at min guard assist for balance, but was able to get up to supervision once in the hallway.  He also started at not  even step to and progressed to minimal step through foot flat gait pattern by the end of his walk.           Balance Overall balance assessment: Needs assistance Sitting-balance support: Feet supported;No upper extremity supported Sitting balance-Leahy Scale: Good     Standing balance support: Bilateral upper extremity supported;Single extremity supported;No upper extremity supported Standing balance-Leahy Scale: Fair                      Cognition Arousal/Alertness: Awake/alert Behavior During Therapy: WFL for tasks assessed/performed Overall Cognitive Status: Within Functional Limits for tasks assessed                      Exercises Total Joint Exercises Ankle Circles/Pumps: AROM;Both;20 reps;Supine Quad Sets: AROM;Left;10 reps;Supine Heel Slides: AAROM;Both;10 reps;Supine Straight Leg Raises: AAROM;Left;10 reps;Supine Goniometric ROM: 12-50        Pertinent Vitals/Pain Pain Assessment: 0-10 Pain Score: 8  Pain Location: left knee Pain Descriptors / Indicators: Aching;Burning Pain Intervention(s): Limited activity within patient's tolerance;Monitored during session;Repositioned;Patient requesting pain meds-RN notified;RN gave pain meds during session           PT Goals (current goals can now be found in the care plan section) Acute Rehab PT Goals Patient Stated Goal: to do as well as his last TKA Progress towards PT goals: Progressing toward goals    Frequency  7X/week    PT Plan Current plan remains appropriate       End of Session Equipment Utilized During Treatment: Gait belt Activity Tolerance: Patient limited by fatigue;Patient limited by lethargy  Patient left: in chair;with call bell/phone within reach;with family/visitor present     Time: 7416-3845 PT Time Calculation (min) (ACUTE ONLY): 64 min  Charges:  $Gait Training: 23-37 mins $Therapeutic Exercise: 23-37 mins                      Ovila Lepage B. Japji Kok, PT, DPT  [phone]   01/26/2015, 5:54 PM

## 2015-01-26 NOTE — Evaluation (Signed)
Physical Therapy Evaluation Patient Details Name: Stephen Sosa. MRN: [id number] DOB: [birth year] Today's Date: 01/26/2015   History of Present Illness  59 y.o. s/p Lt TKA.  History of Rt knee replacement in 2005.  Clinical Impression  Pt is POD #1 and is limited by pain and lethargy.  He was able to progress gait into the hallway with a chair to follow for safety and encouraged increased gait distance.  He was a bit diaphoretic at times, although vitals remained stable throughout.   PT to follow acutely for deficits listed below.   I anticipate he will progress well enough for HHPT, but may need at least two sessions tomorrow.     Follow Up Recommendations Home health PT;Supervision for mobility/OOB    Equipment Recommendations  None recommended by PT    Recommendations for Other Services   NA    Precautions / Restrictions Precautions Precautions: Knee;Fall Precaution Booklet Issued: No Precaution Comments: educated on knee precautions Restrictions Weight Bearing Restrictions: Yes LLE Weight Bearing: Weight bearing as tolerated      Mobility  Bed Mobility Overal bed mobility: Needs Assistance Bed Mobility: Supine to Sit     Supine to sit: Mod assist     General bed mobility comments: Pt OOB in recliner chair already  Transfers Overall transfer level: Needs assistance Equipment used: Rolling walker (2 wheeled) Transfers: Sit to/from Stand Sit to Stand: Min assist         General transfer comment: Min assist to support trunk during transitions, verbal cues for safe hand and foot placement.   Ambulation/Gait Ambulation/Gait assistance: Min guard;+2 safety/equipment (chair to follow for safety and to encourage increased distan) Ambulation Distance (Feet): 80 Feet Assistive device: Rolling walker (2 wheeled) Gait Pattern/deviations: Step-to pattern;Antalgic Gait velocity: decerased Gait velocity interpretation: <1.8 ft/sec, indicative of risk for recurrent  falls General Gait Details: Moderately antalgic gait pattern, foot flat contact despite heavy amount of unweighting by bil arms during gait.  Verbal cues for safe use of RW and safe proximity to RW during gait.  Pt with some "wooziness" during gait and some mild claminess, but BP, O2 and HR were stable when checked.  Encouraged pt to breathe with every step, because he has a tendency to bear down when walking and I did not want him to have a valsalva effect.          Balance Overall balance assessment: Needs assistance Sitting-balance support: Feet supported;No upper extremity supported Sitting balance-Leahy Scale: Good     Standing balance support: Bilateral upper extremity supported Standing balance-Leahy Scale: Poor                               Pertinent Vitals/Pain Pain Assessment: 0-10 Pain Score: 6  Pain Location: left knee Pain Descriptors / Indicators: Aching;Burning;Constant;Cramping Pain Intervention(s): Limited activity within patient's tolerance;Monitored during session;Repositioned;Premedicated before session    Home Living Family/patient expects to be discharged to:: Private residence Living Arrangements: Spouse/significant other;Children (son) Available Help at Discharge: Family;Available 24 hours/day Type of Home: House       Home Layout: Two level;Able to live on main level with bedroom/bathroom Home Equipment: Gilford Rile - 2 wheels;Cane - quad;Cane - single point;Bedside commode;Tub bench      Prior Function Level of Independence: Independent                  Extremity/Trunk Assessment   Upper Extremity Assessment: Defer to OT evaluation  Lower Extremity Assessment: LLE deficits/detail   LLE Deficits / Details: left leg with normal post op pain and weakness.  Ankle 3/5, knee 2/5, hip 2+/5  Cervical / Trunk Assessment: Normal  Communication   Communication: No difficulties  Cognition Arousal/Alertness:  Awake/alert Behavior During Therapy: WFL for tasks assessed/performed Overall Cognitive Status: Within Functional Limits for tasks assessed                         Exercises Total Joint Exercises Ankle Circles/Pumps: AROM;Both;20 reps;Supine Quad Sets: AROM;Left;10 reps;Supine Heel Slides: AAROM;Both;10 reps;Supine Straight Leg Raises: AAROM;Left;10 reps;Supine      Assessment/Plan    PT Assessment Patient needs continued PT services  PT Diagnosis Difficulty walking;Abnormality of gait;Generalized weakness;Acute pain   PT Problem List Decreased strength;Decreased range of motion;Decreased activity tolerance;Decreased balance;Decreased mobility;Decreased knowledge of use of DME;Decreased knowledge of precautions;Pain  PT Treatment Interventions DME instruction;Gait training;Stair training;Functional mobility training;Therapeutic activities;Therapeutic exercise;Balance training;Neuromuscular re-education;Patient/family education;Modalities   PT Goals (Current goals can be found in the Care Plan section) Acute Rehab PT Goals Patient Stated Goal: to do as well as his last TKA PT Goal Formulation: With patient/family Time For Goal Achievement: 02/02/15 Potential to Achieve Goals: Good    Frequency 7X/week           End of Session Equipment Utilized During Treatment: Gait belt Activity Tolerance: Patient limited by fatigue;Patient limited by lethargy Patient left: in chair;with call bell/phone within reach;with family/visitor present           Time: 2751-7001 PT Time Calculation (min) (ACUTE ONLY): 36 min   Charges:   PT Evaluation $Initial PT Evaluation Tier I: 1 Procedure PT Treatments $Gait Training: 8-22 mins        Carlyn Lemke B. Huck Ashworth, PT, DPT [phone]   01/26/2015, 12:39 PM

## 2015-01-26 NOTE — Progress Notes (Signed)
SPORTS MEDICINE AND JOINT REPLACEMENT  Lara Mulch, MD   Carlynn Spry, PA-C [place], [place], [place]  [address]                             [phone]   PROGRESS NOTE  Subjective:  negative for Chest Pain  negative for Shortness of Breath  negative for Nausea/Vomiting   negative for Calf Pain  negative for Bowel Movement   Tolerating Diet: yes         Patient reports pain as 5 on 0-10 scale.    Objective: Vital signs in last 24 hours:   Patient Vitals for the past 24 hrs:  BP Temp Pulse Resp SpO2  01/26/15 1300 (!) 142/72 mmHg 98.9 F (37.2 C) 65 18 97 %  01/26/15 0546 130/74 mmHg 98.1 F (36.7 C) 77 18 100 %  01/26/15 0200 121/72 mmHg 98 F (36.7 C) 70 18 100 %  01/25/15 2057 (!) 101/57 mmHg 98 F (36.7 C) 67 16 98 %    @flow {1959:LAST@   Intake/Output from previous day:   10/10 0701 - 10/11 0700 In: 2677.5 [P.O.:490; I.V.:2137.5] Out: 1160 [Urine:1150]   Intake/Output this shift:       Intake/Output      10/10 0701 - 10/11 0700 10/11 0701 - 10/12 0700   P.O. 490    I.V. (mL/kg) 2137.5 (23.2)    IV Piggyback 50    Total Intake(mL/kg) 2677.5 (29.1)    Urine (mL/kg/hr) 1150 (0.5)    Blood 10 (0)    Total Output 1160     Net +1517.5             LABORATORY DATA:  Recent Labs  01/25/15 1700 01/26/15 0546  WBC 14.0* 13.4*  HGB 15.6 14.5  HCT 45.6 42.8  PLT 190 204    Recent Labs  01/25/15 1700 01/26/15 0546  NA  --  139  K  --  3.7  CL  --  103  CO2  --  28  BUN  --  16  CREATININE 1.16 1.09  GLUCOSE  --  141*  CALCIUM  --  8.7*   Lab Results  Component Value Date   INR 1.06 01/14/2015   INR 1.0 07/01/2008    Examination:  General appearance: alert, cooperative and no distress Extremities: Homans sign is negative, no sign of DVT  Wound Exam: clean, dry, intact   Drainage:  Scant/small amount Serosanguinous exudate  Motor Exam: EHL and FHL Intact  Sensory Exam: Deep Peroneal normal   Assessment:    1 Day  Post-Op  Procedure(s) (LRB): TOTAL KNEE ARTHROPLASTY (Left)  ADDITIONAL DIAGNOSIS:  Active Problems:   S/P total knee arthroplasty  Acute Blood Loss Anemia   Plan: Physical Therapy as ordered Weight Bearing as Tolerated (WBAT)  DVT Prophylaxis:  Lovenox  DISCHARGE PLAN: Home  DISCHARGE NEEDS: HHPT, CPM, Walker and 3-in-1 comode seat         Laquenta Whitsell 01/26/2015, 5:58 PM

## 2015-01-27 LAB — CBC
HCT: 41.6 % (ref 39.0–52.0)
HEMOGLOBIN: 14 g/dL (ref 13.0–17.0)
MCH: 31.4 pg (ref 26.0–34.0)
MCHC: 33.7 g/dL (ref 30.0–36.0)
MCV: 93.3 fL (ref 78.0–100.0)
PLATELETS: 173 10*3/uL (ref 150–400)
RBC: 4.46 MIL/uL (ref 4.22–5.81)
RDW: 12.8 % (ref 11.5–15.5)
WBC: 10.4 10*3/uL (ref 4.0–10.5)

## 2015-01-27 MED ORDER — OXYCODONE HCL 5 MG PO TABS: 5.0000 mg | ORAL_TABLET | ORAL | Status: DC | PRN

## 2015-01-27 MED ORDER — METHOCARBAMOL 500 MG PO TABS: 500.0000 mg | ORAL_TABLET | Freq: Four times a day (QID) | ORAL | Status: DC | PRN

## 2015-01-27 MED ORDER — OXYCODONE HCL ER 10 MG PO T12A: 10.0000 mg | EXTENDED_RELEASE_TABLET | Freq: Two times a day (BID) | ORAL | Status: DC

## 2015-01-27 MED ORDER — ENOXAPARIN SODIUM 40 MG/0.4ML ~~LOC~~ SOLN: 40.0000 mg | SUBCUTANEOUS | Status: DC

## 2015-01-27 NOTE — Progress Notes (Signed)
Physical Therapy Treatment Patient Details Name: Stephen Sosa. MRN: [id number] DOB: Oct 24, 1955 Today's Date: 01/27/2015    History of Present Illness 60 y.o. s/p Lt TKA.  History of Rt knee replacement in 2005.    PT Comments    Pt is progressing well with his mobility, better gait pattern, although very limited and guarded with knee flexion ROM (see below).  He was able to show safety on stairs simulating home entry and wife was present for education.  PT would like to finish reviewing HEP prior to d/c home likely later this PM.   Follow Up Recommendations  Home health PT;Supervision for mobility/OOB     Equipment Recommendations  None recommended by PT    Recommendations for Other Services   NA     Precautions / Restrictions Precautions Precautions: Knee;Fall Precaution Booklet Issued: Yes (comment) Precaution Comments: handout given and reviewed, knee precautions reviewed.  Restrictions LLE Weight Bearing: Weight bearing as tolerated    Mobility  Bed Mobility                  Transfers Overall transfer level: Needs assistance Equipment used: Rolling walker (2 wheeled) Transfers: Sit to/from Stand Sit to Stand: Min guard         General transfer comment: min guard assist for safety during transitions from sit to stand.  Verbal cues for safe hand placement.   Ambulation/Gait Ambulation/Gait assistance: Min guard Ambulation Distance (Feet): 250 Feet Assistive device: Rolling walker (2 wheeled) Gait Pattern/deviations: Step-to pattern;Step-through pattern;Antalgic Gait velocity: decreased Gait velocity interpretation: Below normal speed for age/gender General Gait Details: Pt needed verbal cues for heel to toe gait pattern, started initially, with step to pattern progressing to step through pattern as he went further.    Stairs Stairs: Yes Stairs assistance: Min assist Stair Management: No rails;Step to pattern;Backwards;With walker Number of  Stairs: 3 (x2) General stair comments: Pt practiced steps x 2 with wife present and participating in education and stair assistance.  Pt needed min assist to stabilize RW.          Balance Overall balance assessment: Needs assistance Sitting-balance support: Feet supported;No upper extremity supported Sitting balance-Leahy Scale: Good     Standing balance support: Bilateral upper extremity supported;Single extremity supported;No upper extremity supported Standing balance-Leahy Scale: Good                      Cognition Arousal/Alertness: Awake/alert Behavior During Therapy: WFL for tasks assessed/performed Overall Cognitive Status: Within Functional Limits for tasks assessed                      Exercises Total Joint Exercises Short Arc Quad: AROM;Right;10 reps;Supine Hip ABduction/ADduction: AAROM;Right;10 reps;Supine Straight Leg Raises: AAROM;Right;10 reps;Supine Goniometric ROM: 12-52        Pertinent Vitals/Pain Pain Assessment: 0-10 Pain Score: 4  Pain Location: left knee Pain Descriptors / Indicators: Aching;Burning Pain Intervention(s): Limited activity within patient's tolerance;Monitored during session;Repositioned    PT Goals (current goals can now be found in the care plan section) Acute Rehab PT Goals Patient Stated Goal: to do as well as his last TKA Progress towards PT goals: Progressing toward goals    Frequency  7X/week    PT Plan Current plan remains appropriate       End of Session Equipment Utilized During Treatment: Gait belt Activity Tolerance: Patient limited by fatigue;Patient limited by lethargy Patient left: in chair;with call bell/phone within reach;with family/visitor present  Time: 1005-1103 PT Time Calculation (min) (ACUTE ONLY): 58 min  Charges:  $Gait Training: 38-52 mins $Therapeutic Exercise: 8-22 mins                      Jasey Cortez B. Eldine Rencher, PT, DPT [phone]   01/27/2015, 6:00 PM

## 2015-01-27 NOTE — Progress Notes (Signed)
Physical Therapy Treatment Patient Details Name: Stephen Sosa. MRN: [id number] DOB: 1955/05/17 Today's Date: 01/27/2015    History of Present Illness 59 y.o. s/p Lt TKA.  History of Rt knee replacement in 2005.    PT Comments    Pt is ready for d/c all education completed.    Follow Up Recommendations  Home health PT;Supervision for mobility/OOB     Equipment Recommendations  None recommended by PT    Recommendations for Other Services   NA     Precautions / Restrictions Precautions Precautions: Knee;Fall Precaution Booklet Issued: Yes (comment) Precaution Comments: knee precautions reviewed Restrictions LLE Weight Bearing: Weight bearing as tolerated    Mobility               Transfers Overall transfer level: Modified independent Equipment used: Rolling walker (2 wheeled) Transfers: Sit to/from Stand Sit to Stand: Min guard         General transfer comment: Pt able to demonstrate safe transition to sitting.   Ambulation/Gait Ambulation/Gait assistance: Supervision Ambulation Distance (Feet): 10 Feet Assistive device: Rolling walker (2 wheeled) Gait Pattern/deviations: Step-through pattern;Antalgic Gait velocity: decreased Gait velocity interpretation: Below normal speed for age/gender General Gait Details: Pt moving about room with supervision from wife, safely   Stairs Stairs: Yes Stairs assistance: Min assist Stair Management: No rails;Step to pattern;Backwards;With walker Number of Stairs: 3 (x2) General stair comments: Pt practiced steps x 2 with wife present and participating in education and stair assistance.  Pt needed min assist to stabilize RW.          Balance Overall balance assessment: Needs assistance Sitting-balance support: Feet supported;Bilateral upper extremity supported;No upper extremity supported Sitting balance-Leahy Scale: Good     Standing balance support: Bilateral upper extremity supported;Single extremity  supported;No upper extremity supported Standing balance-Leahy Scale: Good                      Cognition Arousal/Alertness: Awake/alert Behavior During Therapy: WFL for tasks assessed/performed Overall Cognitive Status: Within Functional Limits for tasks assessed                Exercises Total Joint Exercises Short Arc Quad: AROM;Right;10 reps;Seated Hip ABduction/ADduction: AROM;Right;10 reps;Seated Straight Leg Raises: AAROM;Right;10 reps;Supine Long Arc Quad: AROM;Right;10 reps;Seated Knee Flexion: AROM;AAROM;Right;10 reps;Seated Goniometric ROM: 12-52        Pertinent Vitals/Pain Pain Assessment: 0-10 Pain Score: 4  Pain Location: left knee Pain Descriptors / Indicators: Aching;Burning Pain Intervention(s): Limited activity within patient's tolerance;Monitored during session;Repositioned           PT Goals (current goals can now be found in the care plan section) Acute Rehab PT Goals Patient Stated Goal: to do as well as his last TKA Progress towards PT goals: Progressing toward goals    Frequency  7X/week    PT Plan Current plan remains appropriate       End of Session Equipment Utilized During Treatment: Gait belt Activity Tolerance: Patient limited by fatigue;Patient limited by lethargy Patient left: in chair;with call bell/phone within reach;with family/visitor present     Time: 6945-0388 PT Time Calculation (min) (ACUTE ONLY): 12 min  Charges:   $Therapeutic Exercise: 8-22 mins                      Pearlene Teat B. Ermin Parisien, PT, DPT [phone]   01/27/2015, 6:06 PM

## 2015-01-27 NOTE — Progress Notes (Signed)
Stephen Sosa. discharged home per MD order. Discharge instructions reviewed and discussed with patient. All questions and concerns answered. Copy of instructions and scripts given to patient. IV removed.  Patient escorted to car by staff in a wheelchair. No distress noted upon discharge.   Esaw Dace 01/27/2015 3:32 PM

## 2015-01-27 NOTE — Discharge Instructions (Signed)

## 2015-01-27 NOTE — Progress Notes (Signed)
SPORTS MEDICINE AND JOINT REPLACEMENT  Lara Mulch, MD   Carlynn Spry, PA-C [place], [place], [place]  [address]                             [phone]   PROGRESS NOTE  Subjective:  negative for Chest Pain  negative for Shortness of Breath  negative for Nausea/Vomiting   negative for Calf Pain  negative for Bowel Movement   Tolerating Diet: yes         Patient reports pain as 4 on 0-10 scale.    Objective: Vital signs in last 24 hours:   Patient Vitals for the past 24 hrs:  BP Temp Temp src Pulse Resp SpO2  01/27/15 0533 118/80 mmHg 98.7 F (37.1 C) Oral 84 18 97 %  01/26/15 2106 125/79 mmHg 98.3 F (36.8 C) Oral 77 18 96 %    @flow {1959:LAST@   Intake/Output from previous day:   10/11 0701 - 10/12 0700 In: 240 [P.O.:240] Out: 1225 [Urine:1225]   Intake/Output this shift:   10/12 0701 - 10/12 1900 In: 240 [P.O.:240] Out: -    Intake/Output      10/11 0701 - 10/12 0700 10/12 0701 - 10/13 0700   P.O. 240 240   I.V. (mL/kg)     IV Piggyback     Total Intake(mL/kg) 240 (2.6) 240 (2.6)   Urine (mL/kg/hr) 1225 (0.6)    Blood     Total Output 1225     Net -985 +240           LABORATORY DATA:  Recent Labs  01/25/15 1700 01/26/15 0546 01/27/15 0700  WBC 14.0* 13.4* 10.4  HGB 15.6 14.5 14.0  HCT 45.6 42.8 41.6  PLT 190 204 173    Recent Labs  01/25/15 1700 01/26/15 0546  NA  --  139  K  --  3.7  CL  --  103  CO2  --  28  BUN  --  16  CREATININE 1.16 1.09  GLUCOSE  --  141*  CALCIUM  --  8.7*   Lab Results  Component Value Date   INR 1.06 01/14/2015   INR 1.0 07/01/2008    Examination:  General appearance: alert, cooperative and no distress Extremities: Homans sign is negative, no sign of DVT  Wound Exam: clean, dry, intact   Drainage:  None: wound tissue dry  Motor Exam: EHL and FHL Intact  Sensory Exam: Deep Peroneal normal   Assessment:    2 Days Post-Op  Procedure(s) (LRB): TOTAL KNEE ARTHROPLASTY  (Left)  ADDITIONAL DIAGNOSIS:  Active Problems:   S/P total knee arthroplasty  Acute Blood Loss Anemia   Plan: Physical Therapy as ordered Weight Bearing as Tolerated (WBAT)  DVT Prophylaxis:  Lovenox  DISCHARGE PLAN: Home  DISCHARGE NEEDS: HHPT, CPM, Walker and 3-in-1 comode seat         Catalia Massett 01/27/2015, 1:44 PM

## 2015-01-27 NOTE — Progress Notes (Signed)
In to speak to pt and wife about goals set yesterday for OT. They report they are fine with LBADLs, toilet transfers, and does not feel he needs to practice tub transfer. Acute OT will D/C pt.  [address], OTR/L [phone] 01/27/2015

## 2015-02-11 NOTE — Discharge Summary (Signed)
SPORTS MEDICINE & JOINT REPLACEMENT   Lara Mulch, MD   Carlynn Spry, PA-C [place], [place], [place]  [address]                             [phone]  PATIENT ID: Stephen Sosa.        MRN:  [id number]          DOB/AGE: [birth year] / 59 y.o.    DISCHARGE SUMMARY  ADMISSION DATE:    01/25/2015 DISCHARGE DATE:  01/27/2015  ADMISSION DIAGNOSIS: primary osteoarthritis left knee    DISCHARGE DIAGNOSIS:  primary osteoarthritis left knee    ADDITIONAL DIAGNOSIS: Active Problems:   S/P total knee arthroplasty  Past Medical History  Diagnosis Date  . Headache     rarely  . Arthritis   . Joint pain   . Joint swelling   . Macular degeneration     dry  . Phlebitis     PROCEDURE: Procedure(s): TOTAL KNEE ARTHROPLASTY on 01/25/2015  CONSULTS:     HISTORY:  See H&P in chart  HOSPITAL COURSE:  Stephen Sosa. is a 59 y.o. admitted on 01/25/2015 and found to have a diagnosis of primary osteoarthritis left knee.  After appropriate laboratory studies were obtained  they were taken to the operating room on 01/25/2015 and underwent Procedure(s): TOTAL KNEE ARTHROPLASTY.   They were given perioperative antibiotics:  Anti-infectives    Start     Dose/Rate Route Frequency Ordered Stop   01/25/15 1630  ceFAZolin (ANCEF) IVPB 2 g/50 mL premix     2 g 100 mL/hr over 30 Minutes Intravenous Every 6 hours 01/25/15 1605 01/25/15 2136   01/25/15 0715  ceFAZolin (ANCEF) IVPB 2 g/50 mL premix     2 g 100 mL/hr over 30 Minutes Intravenous On call to O.R. 01/25/15 1696 01/25/15 0743    .  Tolerated the procedure well.  Placed with a foley intraoperatively.  Given Ofirmev at induction and for 48 hours.    POD# 1: Vital signs were stable.  Patient denied Chest pain, shortness of breath, or calf pain.  Patient was started on Lovenox 30 mg subcutaneously twice daily at 8am.  Consults to PT, OT, and care management were made.  The patient was weight bearing as tolerated.  CPM  was placed on the operative leg 0-90 degrees for 6-8 hours a day.  Incentive spirometry was taught.  Dressing was changed.  Hemovac was discontinued.      POD #2, Continued  PT for ambulation and exercise program.  IV saline locked.  O2 discontinued.    The remainder of the hospital course was dedicated to ambulation and strengthening.   The patient was discharged on 2 days post op in  Good condition.  Blood products given:none  DIAGNOSTIC STUDIES: Recent vital signs: No data found.      Recent laboratory studies: No results for input(s): WBC, HGB, HCT, PLT in the last 168 hours. No results for input(s): NA, K, CL, CO2, BUN, CREATININE, GLUCOSE, CALCIUM in the last 168 hours. Lab Results  Component Value Date   INR 1.06 01/14/2015   INR 1.0 07/01/2008     Recent Radiographic Studies :  Dg Chest 2 View  01/14/2015  CLINICAL DATA:  Preop total left knee EXAM: CHEST  2 VIEW COMPARISON:  07/01/2008 FINDINGS: The heart size and mediastinal contours are within normal limits. Both lungs are clear. The visualized skeletal  structures are unremarkable. IMPRESSION: No active cardiopulmonary disease. Electronically Signed   By: Franchot Gallo M.D.   On: 01/14/2015 11:35    DISCHARGE INSTRUCTIONS: Discharge Instructions    CPM    Complete by:  As directed   Continuous passive motion machine (CPM):      Use the CPM from 0 to 90 for 6-8 hours per day.      You may increase by 10 per day.  You may break it up into 2 or 3 sessions per day.      Use CPM for 2 weeks or until you are told to stop.     Call MD / Call 911    Complete by:  As directed   If you experience chest pain or shortness of breath, CALL 911 and be transported to the hospital emergency room.  If you develope a fever above 101 F, pus (white drainage) or increased drainage or redness at the wound, or calf pain, call your surgeon's office.     Change dressing    Complete by:  As directed   Change dressing on Thursday, then  change the dressing daily with sterile 4 x 4 inch gauze dressing and apply TED hose.     Constipation Prevention    Complete by:  As directed   Drink plenty of fluids.  Prune juice may be helpful.  You may use a stool softener, such as Colace (over the counter) 100 mg twice a day.  Use MiraLax (over the counter) for constipation as needed.     Diet - low sodium heart healthy    Complete by:  As directed      Do not put a pillow under the knee. Place it under the heel.    Complete by:  As directed      Driving restrictions    Complete by:  As directed   No driving for 6 weeks     Increase activity slowly as tolerated    Complete by:  As directed      Lifting restrictions    Complete by:  As directed   No lifting for 6 weeks     TED hose    Complete by:  As directed   Use stockings (TED hose) for 2 weeks on both leg(s).  You may remove them at night for sleeping.           DISCHARGE MEDICATIONS:     Medication List    TAKE these medications        enoxaparin 40 MG/0.4ML injection  Commonly known as:  LOVENOX  Inject 0.4 mLs (40 mg total) into the skin daily.     meloxicam 15 MG tablet  Commonly known as:  MOBIC  Take 15 mg by mouth daily.     methocarbamol 500 MG tablet  Commonly known as:  ROBAXIN  Take 1-2 tablets (500-1,000 mg total) by mouth every 6 (six) hours as needed for muscle spasms.     oxyCODONE 5 MG immediate release tablet  Commonly known as:  Oxy IR/ROXICODONE  Take 1-2 tablets (5-10 mg total) by mouth every 3 (three) hours as needed for breakthrough pain.     oxyCODONE 10 mg 12 hr tablet  Commonly known as:  OXYCONTIN  Take 1 tablet (10 mg total) by mouth every 12 (twelve) hours.        FOLLOW UP VISIT:       Follow-up Information    Follow up with [facility].  Specialty:  [place]   Why:  Someone from [facility] will contact you concerning start time for therapy. You will begin therapy on 01/28/15   Contact  information:   [address] [place] [address] [phone]       Follow up with Rudean Haskell, MD. Call on 02/09/2015.   Specialty:  Orthopedic Surgery   Contact information:   [address] [place] [address] [phone]       DISPOSITION: HOME   CONDITION:  Good   [name] 02/11/2015, 1:11 PM

## 2015-04-30 ENCOUNTER — Other Ambulatory Visit: Payer: Self-pay | Admitting: Family Medicine

## 2015-04-30 DIAGNOSIS — M1990 Unspecified osteoarthritis, unspecified site: Secondary | ICD-10-CM

## 2015-04-30 NOTE — Telephone Encounter (Signed)
Last OV was 2014  Thanks,   -Mickel Baas

## 2015-07-12 ENCOUNTER — Inpatient Hospital Stay
Admit: 2015-07-12 | Discharge: 2015-07-12 | Disposition: A | Discharge status: Home / Self Care | Payer: Self-pay | Attending: Emergency Medicine

## 2015-07-12 ENCOUNTER — Emergency Department: Admit: 2015-07-12 | Payer: Self-pay | Primary: Family Medicine

## 2015-07-12 DIAGNOSIS — R1032 Left lower quadrant pain: Secondary | ICD-10-CM

## 2015-07-12 LAB — CBC W/O DIFF
HCT: 47.5 % (ref 41.1–50.3)
HGB: 16 g/dL (ref 13.6–17.2)
MCH: 29 PG (ref 26.1–32.9)
MCHC: 33.7 g/dL (ref 31.4–35.0)
MCV: 86.1 FL (ref 79.6–97.8)
MPV: 11.6 FL (ref 10.8–14.1)
PLATELET: 155 10*3/uL (ref 150–450)
RBC: 5.52 M/uL (ref 4.23–5.67)
RDW: 14.9 % — ABNORMAL HIGH (ref 11.9–14.6)
WBC: 6.2 10*3/uL (ref 4.3–11.1)

## 2015-07-12 LAB — METABOLIC PANEL, BASIC
Anion gap: 10 mmol/L (ref 7–16)
BUN: 11 MG/DL (ref 6–23)
CO2: 27 mmol/L (ref 21–32)
Calcium: 9 MG/DL (ref 8.3–10.4)
Chloride: 101 mmol/L (ref 98–107)
Creatinine: 1.09 MG/DL (ref 0.8–1.5)
GFR est AA: >60 mL/min/{1.73_m2} (ref >60)
GFR est non-AA: >60 mL/min/{1.73_m2} (ref >60)
Glucose: 94 mg/dL (ref 65–100)
Potassium: 4 mmol/L (ref 3.5–5.1)
Sodium: 138 mmol/L (ref 136–145)

## 2015-07-12 MED ORDER — SODIUM CHLORIDE 0.9% BOLUS IV
0.9 % | Freq: Once | INTRAVENOUS | Status: AC
Start: 2015-07-12 — End: 2015-07-12
  Administered 2015-07-12: 16:00:00 via INTRAVENOUS

## 2015-07-12 MED ORDER — SODIUM CHLORIDE 0.9% BOLUS IV
0.9 % | Freq: Once | INTRAVENOUS | Status: AC
Start: 2015-07-12 — End: 2015-07-12
  Administered 2015-07-12: 17:00:00 via INTRAVENOUS

## 2015-07-12 MED ORDER — SODIUM CHLORIDE 0.9 % IJ SYRG
Freq: Three times a day (TID) | INTRAMUSCULAR | Status: DC
Start: 2015-07-12 — End: 2015-07-12

## 2015-07-12 MED ORDER — SODIUM CHLORIDE 0.9 % IJ SYRG
INTRAMUSCULAR | Status: DC | PRN
Start: 2015-07-12 — End: 2015-07-12

## 2015-07-12 MED ORDER — SODIUM CHLORIDE 0.9 % IV
INTRAVENOUS | Status: DC
Start: 2015-07-12 — End: 2015-07-12
  Administered 2015-07-12: 15:00:00 via INTRAVENOUS

## 2015-07-12 MED ORDER — IOPAMIDOL 76 % IV SOLN
370 mg iodine /mL (76 %) | Freq: Once | INTRAVENOUS | Status: AC
Start: 2015-07-12 — End: 2015-07-12
  Administered 2015-07-12: 17:00:00 via INTRAVENOUS

## 2015-07-12 MED ORDER — DIATRIZOATE MEGLUMINE & SODIUM 66 %-10 % ORAL SOLN
66-10 % | Freq: Once | ORAL | Status: AC
Start: 2015-07-12 — End: 2015-07-12
  Administered 2015-07-12: 16:00:00 via ORAL

## 2015-07-12 MED ORDER — SALINE PERIPHERAL FLUSH PRN
Freq: Once | INTRAMUSCULAR | Status: AC
Start: 2015-07-12 — End: 2015-07-12
  Administered 2015-07-12: 17:00:00

## 2015-07-12 NOTE — ED Triage Notes (Signed)
Pt arrives c/o constipation unless he takes something to help go to the bathroom, states it has been going on for 2 weeks, hasn't seen anyone for the issue yet.

## 2015-07-12 NOTE — ED Notes (Signed)
Patient drank gastroview per MD order.

## 2015-07-12 NOTE — ED Provider Notes (Addendum)
HPI Comments: 60 year old gentleman with a history of left lower quadrant abdominal pain and progressive constipation.  Patient says he tried to go get a colonic one attempted to insert the probe they were unable to get it as far in because they were feeling of resistance.  Patient's that he is concerned that he may have a large polyp or a mass.  Patient says he's had no blood in his stool and has had no nausea or vomiting. He has tried multiple over-the-counter stool softeners and fiber supplements without any relief.    Elements of this note were created using speech recognition software.  As such, errors of speech recognition may be present.    Patient is a 60 y.o. male presenting with constipation. The history is provided by the patient.   Constipation    Associated symptoms include abdominal pain and constipation. Pertinent negatives include no dysuria, no chills, no fever, no nausea, no vomiting and no diarrhea.        Past Medical History:   Diagnosis Date   ??? CAD (coronary artery disease)    ??? Dyslipidemia    ??? Hypertension    ??? Unstable angina (HCC) 02/02/2010       Past Surgical History:   Procedure Laterality Date   ??? CARDIAC SURG PROCEDURE UNLIST  11/2009    stent x1 LAD    ??? LEFT HEART CATH,PERCUTANEOUS  02/02/2010    stent x1         No family history on file.    Social History     Social History   ??? Marital status: MARRIED     Spouse name: N/A   ??? Number of children: N/A   ??? Years of education: N/A     Occupational History   ??? Not on file.     Social History Main Topics   ??? Smoking status: Never Smoker   ??? Smokeless tobacco: Not on file   ??? Alcohol use No   ??? Drug use: Not on file   ??? Sexual activity: Not on file     Other Topics Concern   ??? Not on file     Social History Narrative   ??? No narrative on file         ALLERGIES: Aspirin and Tylenol [acetaminophen]    Review of Systems   Constitutional: Negative for chills, diaphoresis and fever.    HENT: Negative for congestion, rhinorrhea and sore throat.    Eyes: Negative for redness and visual disturbance.   Respiratory: Negative for cough, chest tightness, shortness of breath and wheezing.    Cardiovascular: Negative for chest pain and palpitations.   Gastrointestinal: Positive for abdominal pain and constipation. Negative for blood in stool, diarrhea, nausea and vomiting.   Endocrine: Negative for polydipsia and polyuria.   Genitourinary: Negative for dysuria and hematuria.   Musculoskeletal: Negative for arthralgias, myalgias and neck stiffness.   Skin: Negative for rash.   Allergic/Immunologic: Negative for environmental allergies and food allergies.   Neurological: Negative for dizziness, weakness and headaches.   Hematological: Negative for adenopathy. Does not bruise/bleed easily.   Psychiatric/Behavioral: Negative for confusion and sleep disturbance. The patient is not nervous/anxious.        Vitals:    07/12/15 0913   BP: (!) 160/97   Pulse: 61   Resp: 16   Temp: 98.2 ??F (36.8 ??C)   SpO2: 100%            Physical Exam   Constitutional: He  is oriented to person, place, and time. He appears well-developed and well-nourished.   HENT:   Head: Normocephalic and atraumatic.   Eyes: Conjunctivae and EOM are normal. Pupils are equal, round, and reactive to light.   Neck: Normal range of motion.   Cardiovascular: Normal rate and regular rhythm.    Pulmonary/Chest: Effort normal and breath sounds normal. No respiratory distress. He has no wheezes. He has no rales. He exhibits no tenderness.   Abdominal: Soft. Bowel sounds are normal. There is tenderness. There is no rebound and no guarding.   Mild tenderness to palpation in his lower abdomen without any rebound or guarding   Musculoskeletal: Normal range of motion. He exhibits no edema or tenderness.   Lymphadenopathy:     He has no cervical adenopathy.   Neurological: He is alert and oriented to person, place, and time.   Skin: Skin is warm and dry.    Psychiatric: He has a normal mood and affect.   Nursing note and vitals reviewed.       MDM  Number of Diagnoses or Management Options  Diagnosis management comments: Given his symptoms and his exam I will get a CT to look for diverticulitis or potentially a colon mass.    No abdomen or pelvis abnormality although he did have a small aortic aneurysm.  I will discuss the aneurysm with vascular surgery and than likely refer him there as an outpatient.    I spoke with Dr. Jean Rosenthal who agreed with the plan for outpatient follow-up.    ED Course       Procedures

## 2015-07-12 NOTE — ED Notes (Signed)
I have reviewed discharge instructions with the patient. Patient verbalizes understanding. Opportunity for questions provided. Patient ambulatory off the unit. No distress noted at this time.

## 2015-07-16 ENCOUNTER — Inpatient Hospital Stay: Admit: 2015-07-16 | Primary: Family Medicine

## 2015-07-23 ENCOUNTER — Other Ambulatory Visit: Payer: Self-pay | Admitting: Family Medicine

## 2015-07-23 DIAGNOSIS — M1990 Unspecified osteoarthritis, unspecified site: Secondary | ICD-10-CM

## 2016-03-07 ENCOUNTER — Other Ambulatory Visit: Payer: Self-pay | Admitting: Family Medicine

## 2016-03-07 DIAGNOSIS — M1990 Unspecified osteoarthritis, unspecified site: Secondary | ICD-10-CM

## 2016-03-13 ENCOUNTER — Other Ambulatory Visit: Payer: Self-pay | Admitting: Orthopedic Surgery

## 2016-03-13 DIAGNOSIS — M545 Low back pain: Secondary | ICD-10-CM

## 2016-03-14 ENCOUNTER — Ambulatory Visit
Admission: RE | Admit: 2016-03-14 | Discharge: 2016-03-14 | Disposition: A | Discharge status: Home / Self Care | Payer: Managed Care, Other (non HMO) | Source: Ambulatory Visit | Attending: Orthopedic Surgery | Admitting: Orthopedic Surgery

## 2016-03-14 ENCOUNTER — Emergency Department (HOSPITAL_COMMUNITY)
Admission: EM | Admit: 2016-03-14 | Discharge: 2016-03-14 | Disposition: A | Discharge status: Home / Self Care | Payer: Managed Care, Other (non HMO) | Attending: Emergency Medicine | Admitting: Emergency Medicine

## 2016-03-14 ENCOUNTER — Emergency Department (HOSPITAL_COMMUNITY): Payer: Managed Care, Other (non HMO)

## 2016-03-14 ENCOUNTER — Encounter (HOSPITAL_COMMUNITY): Payer: Self-pay

## 2016-03-14 DIAGNOSIS — Z96653 Presence of artificial knee joint, bilateral: Secondary | ICD-10-CM | POA: Insufficient documentation

## 2016-03-14 DIAGNOSIS — M5136 Other intervertebral disc degeneration, lumbar region: Secondary | ICD-10-CM | POA: Insufficient documentation

## 2016-03-14 DIAGNOSIS — M545 Low back pain, unspecified: Secondary | ICD-10-CM

## 2016-03-14 MED ORDER — PREDNISONE 20 MG PO TABS: 40.0000 mg | ORAL_TABLET | Freq: Every day | ORAL | 0 refills | Status: DC

## 2016-03-14 MED ORDER — HYDROMORPHONE HCL 2 MG/ML IJ SOLN
1.0000 mg | Freq: Once | INTRAMUSCULAR | Status: AC
  Administered 2016-03-14: 1 mg via INTRAVENOUS
  Filled 2016-03-14: qty 1

## 2016-03-14 MED ORDER — OXYCODONE HCL 5 MG PO TABS: 5.0000 mg | ORAL_TABLET | ORAL | 0 refills | Status: DC | PRN

## 2016-03-14 NOTE — ED Triage Notes (Signed)
Patient complains of ongoing lower back pain with radiation down right leg. States that he was to have MRI this am at [facility] but they were unable to provide sedation so he was sent here for MRI

## 2016-03-14 NOTE — ED Notes (Signed)
MRI called stating pt still unable to lay flat due to pain. PA [place] notified. This RN taking another dose of Dilaudid to pt.

## 2016-03-14 NOTE — ED Notes (Signed)
Patient transported to MRI 

## 2016-03-14 NOTE — ED Provider Notes (Signed)
[facility] DEPT Provider Note   CSN: [id number] Arrival date & time: 03/14/16  0757     History   Chief Complaint Chief Complaint  Patient presents with  . Back Pain-needs MRI    HPI Stephen Sosa. is a 60 y.o. male.  HPI   Pt is a 60 yo male with PMH of bilateral knee replacement and arthritis who presents to the ED with complaint of low back pain. Pt reports on 11/18 while he was trying to help his son load a grill into the truck bed he strained his back when he tried to catch the grill as it was falling off the truck bed. He reports having Constant sharp, throbbing, cramping pain to his right buttocks that radiates around to his right lateral and anterior thigh. Patient reports pain is worse when laying supine or when bearing weight on his right leg. Patient states he fell last night when he got up from his recliner and tried to use the bathroom due to severe pain and weakness in his right leg. Denies head injury or LOC. Patient reports he was seen by his orthopedist Dr. Lorre Nick a few days after the incident, lumbar spine x-ray revealed degenerative changes and patient was given Norco and muscle relaxant. Patient reports he has been taking the medications with little relief. Patient was scheduled to have an MRI performed this morning however due to being unable to tolerate laying supine few is advised to come to the ED for sedation to have his MRI performed. Pt denies fever, numbness, tingling, saddle anesthesia, loss of bowel or bladder, weakness, chest pain, abdominal pain, N/V/D, urinary retention, IVDU, cancer or recent spinal manipulation.   Past Medical History:  Diagnosis Date  . Arthritis   . Headache    rarely  . Joint pain   . Joint swelling   . Macular degeneration    dry  . Phlebitis     Patient Active Problem List   Diagnosis Date Noted  . 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 Arthritis 12/24/2014    Past Surgical History:  Procedure Laterality Date    . HERNIA REPAIR  [id number]   umbilical hernia  . JOINT REPLACEMENT Right 2005   knee  . KNEE ARTHROSCOPY Left   . TOTAL KNEE ARTHROPLASTY Left 01/25/2015   Procedure: TOTAL KNEE ARTHROPLASTY;  Surgeon: Vickey Huger, MD;  Location: [facility];  Service: Orthopedics;  Laterality: Left;  Marland Kitchen VARICOSE VEIN SURGERY Bilateral 2003   ligation and stripping       Home Medications    Prior to Admission medications   Medication Sig Start Date End Date Taking? Authorizing Provider  ibuprofen (ADVIL,MOTRIN) 800 MG tablet Take 800 mg by mouth every 8 (eight) hours as needed for mild pain.   Yes Historical Provider, MD  meloxicam (MOBIC) 15 MG tablet TAKE ONE TABLET BY MOUTH UP TO ONCE A DAY AS NEEDED FOR PAIN 07/23/15  Yes Margarita Rana, MD  enoxaparin (LOVENOX) 40 MG/0.4ML injection Inject 0.4 mLs (40 mg total) into the skin daily. Patient not taking: Reported on 03/14/2016 01/27/15   Carlynn Spry, PA-C  methocarbamol (ROBAXIN) 500 MG tablet Take 1-2 tablets (500-1,000 mg total) by mouth every 6 (six) hours as needed for muscle spasms. Patient not taking: Reported on 03/14/2016 01/27/15   Carlynn Spry, PA-C  oxyCODONE (ROXICODONE) 5 MG immediate release tablet Take 1 tablet (5 mg total) by mouth every 4 (four) hours as needed for severe pain. 03/14/16   Chesley Noon  Ladasia Sircy, PA-C  predniSONE (DELTASONE) 20 MG tablet Take 2 tablets (40 mg total) by mouth daily. 03/14/16   Nona Dell, PA-C    Family History Family History  Problem Relation Age of Onset  . Dementia Father     Social History Social History  Substance Use Topics  . Smoking status: Never Smoker  . Smokeless tobacco: Not on file  . Alcohol use Yes     Comment: 1-2 beer daily     Allergies   Bee venom and Adhesive [tape]   Review of Systems Review of Systems  Musculoskeletal: Positive for back pain.  Neurological: Positive for weakness and numbness.  All other systems reviewed and are negative.    Physical  Exam Updated Vital Signs BP 115/82   Pulse 70   Temp 97.6 F (36.4 C) (Oral)   Resp 14   SpO2 93%   Physical Exam  Constitutional: He is oriented to person, place, and time. He appears well-developed and well-nourished. No distress.  HENT:  Head: Normocephalic and atraumatic.  Mouth/Throat: Oropharynx is clear and moist. No oropharyngeal exudate.  Eyes: Conjunctivae and EOM are normal. Right eye exhibits no discharge. Left eye exhibits no discharge. No scleral icterus.  Neck: Normal range of motion. Neck supple.  Cardiovascular: Normal rate, regular rhythm, normal heart sounds and intact distal pulses.   Pulmonary/Chest: Effort normal and breath sounds normal. No respiratory distress. He has no wheezes. He has no rales. He exhibits no tenderness.  Abdominal: Soft. Bowel sounds are normal. He exhibits no distension and no mass. There is no tenderness. There is no rebound and no guarding.  Musculoskeletal: He exhibits tenderness. He exhibits no edema or deformity.  No midline C, T, or L tenderness. TTP over right lumbar paraspinal muscles and right buttocks. Full range of motion of neck and decreased ROM of back due to reported pain. Full range of motion of bilateral upper extremities and left leg with 5/5 strenght. Decreased ROM of right hip due to reported pain with 4/5 strength noted to right hip and knee, 5/5 strength on right with dorsi/plantarflexion. Unable to perform straight leg raise due to pain reporting severe pain when laying supine. Sensation intact. 2+ radial and PT pulses. Cap refill <2 seconds.   Neurological: He is alert and oriented to person, place, and time. No sensory deficit.  Skin: Skin is warm and dry. He is not diaphoretic.  Nursing note and vitals reviewed.    ED Treatments / Results  Labs (all labs ordered are listed, but only abnormal results are displayed) Labs Reviewed - No data to display  EKG  EKG Interpretation None       Radiology Mr Lumbar  Spine Wo Contrast  Result Date: 03/14/2016 CLINICAL DATA:  60 year old male post lifting injury 1 week ago. Lower back pain extending into right leg. Initial encounter. EXAM: MRI LUMBAR SPINE WITHOUT CONTRAST TECHNIQUE: Multiplanar, multisequence MR imaging of the lumbar spine was performed. No intravenous contrast was administered. COMPARISON:  None. FINDINGS: Exam is motion degraded. Segmentation: Transitional vertebra labeled L5. By this level assignment, the present examination incorporates from the T10-11 disc space level through the lower sacrum. Alignment:  Mild curvature. Vertebrae: Endplate reactive changes L3-4 and L4-5. Scattered focal fatty deposits. No worrisome osseous lesion. Conus medullaris: Extends to the T12 level and appears normal. Paraspinal and other soft tissues: Left renal parapelvic cysts incidentally noted. Disc levels: T10-11: Not completely visualized. T11-12: Sagittal imaging without significant spinal stenosis or foraminal narrowing. Minimal spur.  T12-L1: Mild facet bony overgrowth slightly greater on right. Minimal indentation right posterior lateral thecal sac. L1-2:  Minimal facet degenerative changes. L2-3: Facet degenerative changes. Mild ligamentum flavum hypertrophy. Minimal retrolisthesis L2. Disc degeneration. Bulge/broad-based protrusion with caudal extension greater on the right. Multifactorial mild to slightly moderate thecal sac narrowing and lateral recess narrowing greater on the right. Right foraminal/ lateral extension of disc and osteophyte with mild impression upon the exiting right L2 nerve root. L3-4: Prominent disc degeneration. Bulge and osteophyte with foraminal extension greater on right with baseline mild to slightly moderate foraminal narrowing greater on the right. Additionally, as best appreciated on sagittal images is a small extruded disc fragment extending into the narrowed right neural foramen further compressing the exiting right L3 nerve root. Very  mild thecal sac narrowing. L4-5: Prominent facet degenerative changes. Prominent disc degeneration with bulge and osteophyte having foraminal/lateral extension greater on left with encroachment upon exiting L4 nerve roots greater on the left. Minimal narrowing lateral aspect of the thecal sac greater on the right. L5-S1: Transitional disc. Facet degenerative changes. No significant spinal stenosis or foraminal IMPRESSION: Transitional vertebra labeled L5. This level assignment will need to be understood by anyone involving the patient's future care. Exam is motion degraded. Multilevel degenerative changes. Summary of pertinent findings includes: L3-4 multifactorial baseline mild to slightly moderate foraminal narrowing greater on the right. Additionally, as best appreciated on sagittal images is a small extruded disc fragment extending into the narrowed right neural foramen further compressing the exiting right L3 nerve root. Very mild thecal sac narrowing. L4-5: Prominent facet degenerative changes. Bulge and osteophyte having foraminal/lateral extension greater on left with encroachment upon exiting L4 nerve roots greater on the left. Minimal narrowing lateral aspect of the thecal sac greater on the right. L2-3 bulge/broad-based protrusion with caudal extension greater on the right. Multifactorial mild to slightly moderate thecal sac narrowing and lateral recess narrowing greater on the right. Right foraminal/ lateral extension of disc and osteophyte with mild impression upon the exiting right L2 nerve root. Electronically Signed   By: Genia Del M.D.   On: 03/14/2016 12:06    Procedures Procedures (including critical care time)  Medications Ordered in ED Medications  HYDROmorphone (DILAUDID) injection 1 mg (1 mg Intravenous Given 03/14/16 0941)  HYDROmorphone (DILAUDID) injection 1 mg (1 mg Intravenous Given 03/14/16 1059)     Initial Impression / Assessment and Plan / ED Course  I have reviewed  the triage vital signs and the nursing notes.  Pertinent labs & imaging results that were available during my care of the patient were reviewed by me and considered in my medical decision making (see chart for details).  Clinical Course     Patient presents with low back pain that occurred straining to lift a grill into his son's truck bed on 11/18. Reports radiation to right thigh with mild numbness/tingling. Denies fever. No back pain and finds. Patient has been seen by his orthopedist and was scheduled to have an MRI performed this morning however due to patient's severe worsening of pain when laying supine he was sent to the ED to have MRI performed. VSS. Exam revealed TTP over right lumbar paraspinal muscles and right buttocks, no midline spinal tenderness. Dec strength noted to right hip and knee. Sensation grossly intact. 2+ DP pulses. Pt given pain meds in the ED and MRI ordered. MR lumbar spine showed multilevel degenerative changes from L2-L5 with bulge noted at L2-3 and L4-5. On reevaluation patient is resting comfortably  in bed and reports his pain has significantly improved. Discussed results with patient. Plan to discharge patient home with pain meds and steroids. Patient reports he has a follow-up appointment scheduled with Dr. Lorre Nick in 2 days. Advised patient to follow up at his scheduled appointment and discuss symptomatic treatment. Advised patient to continue taking his home prescription of muscle relaxants as prescribed. Discussed strict return precautions.  Final Clinical Impressions(s) / ED Diagnoses   Final diagnoses:  Low back pain  Degenerative disc disease, lumbar    New Prescriptions New Prescriptions   OXYCODONE (ROXICODONE) 5 MG IMMEDIATE RELEASE TABLET    Take 1 tablet (5 mg total) by mouth every 4 (four) hours as needed for severe pain.   PREDNISONE (DELTASONE) 20 MG TABLET    Take 2 tablets (40 mg total) by mouth daily.     Chesley Noon [place], [place] 03/14/16  [place], MD 03/14/16 1341

## 2016-03-14 NOTE — Discharge Instructions (Signed)
Take your pain medications as prescribed. Refrain from doing any heavy lifting or movements that exacerbate your symptoms for the next week.  Follow-up with your orthopedist at your scheduled appointment in 2 days for further evaluation and management of your low back pain. Please return to the Emergency Department if symptoms worsen or new onset of fever, numbness, tingling, groin anesthesia, loss of bowel or bladder, weakness, urinary retention, abdominal pain, chest pain.

## 2016-03-14 NOTE — Discharge Planning (Signed)
Pt up for discharge. EDCM reviewed chart for possible CM needs.  No needs identified or communicated.  

## 2016-05-04 ENCOUNTER — Encounter: Payer: 59 | Admitting: Family Medicine

## 2016-05-16 ENCOUNTER — Other Ambulatory Visit: Payer: Self-pay | Admitting: Family Medicine

## 2016-05-16 DIAGNOSIS — M1990 Unspecified osteoarthritis, unspecified site: Secondary | ICD-10-CM

## 2016-05-17 NOTE — Telephone Encounter (Signed)
Please review, your patient now last visit 12/2014-aa

## 2016-05-22 ENCOUNTER — Ambulatory Visit (INDEPENDENT_AMBULATORY_CARE_PROVIDER_SITE_OTHER): Payer: 59 | Admitting: Family Medicine

## 2016-05-22 VITALS — BP 128/70 | HR 76 | Temp 98.0°F | Resp 16 | Ht 68.0 in | Wt 207.0 lb

## 2016-05-22 DIAGNOSIS — N529 Male erectile dysfunction, unspecified: Secondary | ICD-10-CM

## 2016-05-22 DIAGNOSIS — Z125 Encounter for screening for malignant neoplasm of prostate: Secondary | ICD-10-CM

## 2016-05-22 DIAGNOSIS — R319 Hematuria, unspecified: Secondary | ICD-10-CM

## 2016-05-22 DIAGNOSIS — Z0000 Encounter for general adult medical examination without abnormal findings: Secondary | ICD-10-CM | POA: Diagnosis not present

## 2016-05-22 DIAGNOSIS — Z1211 Encounter for screening for malignant neoplasm of colon: Secondary | ICD-10-CM

## 2016-05-22 LAB — POCT URINALYSIS DIPSTICK
BILIRUBIN UA: NEGATIVE
GLUCOSE UA: NEGATIVE
KETONES UA: NEGATIVE
Leukocytes, UA: NEGATIVE
Nitrite, UA: NEGATIVE
Protein, UA: NEGATIVE
Spec Grav, UA: >=1.03
Urobilinogen, UA: NEGATIVE
pH, UA: 5

## 2016-05-22 LAB — POCT UA - MICROSCOPIC ONLY
Bacteria, U Microscopic: NEGATIVE
MUCUS UA: NEGATIVE

## 2016-05-22 MED ORDER — SILDENAFIL CITRATE 20 MG PO TABS: 20.0000 mg | ORAL_TABLET | Freq: Three times a day (TID) | ORAL | 0 refills | Status: DC

## 2016-05-22 NOTE — Progress Notes (Signed)
Patient: Stephen Gaebler., Male    DOB: 1955-08-27, 61 y.o.   MRN: [id number] Visit Date: 05/22/2016  Today's Provider: Wilhemena Durie, MD   Chief Complaint  Patient presents with  . Annual Exam   Subjective:  Stephen Sosa. is a 61 y.o. male who presents today for health maintenance and complete physical. He feels well. He reports exercising 45 minutes walking daily. He reports he is sleeping fairly well. He says he has had a rough past year.He has ad bilat TKR by Dr Laureen Ochs and then lumbar laminectomy by dr Arnoldo Morale on 04/26/16.He is on light duty work until May. Immunization History  Administered Date(s) Administered  . Tdap 03/04/2013   Never had a colonoscopy  Review of Systems  Constitutional: Negative.   HENT: Negative.   Eyes: Negative.   Respiratory: Negative.   Cardiovascular: Positive for leg swelling.       Left leg swelling at end of day goes down over night.  Gastrointestinal: Negative.   Endocrine: Negative.   Genitourinary: Negative.   Musculoskeletal: Positive for arthralgias, back pain and myalgias.  Skin: Negative.   Allergic/Immunologic: Negative.   Neurological: Positive for numbness.  Hematological: Bruises/bleeds easily.  Psychiatric/Behavioral: Negative.     Social History   Social History  . Marital status: Married    Spouse name: N/A  . Number of children: N/A  . Years of education: N/A   Occupational History  . Not on file.   Social History Main Topics  . Smoking status: Never Smoker  . Smokeless tobacco: Not on file  . Alcohol use Yes     Comment: 1-2 beer daily  . Drug use: No  . Sexual activity: Yes   Other Topics Concern  . Not on file   Social History Narrative  . No narrative on file    Patient Active Problem List   Diagnosis Date Noted  . 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 Arthritis 12/24/2014    Past Surgical History:  Procedure Laterality Date  . HERNIA REPAIR  [id number]   umbilical hernia  . JOINT REPLACEMENT  Right 2005   knee  . KNEE ARTHROSCOPY Left   . TOTAL KNEE ARTHROPLASTY Left 01/25/2015   Procedure: TOTAL KNEE ARTHROPLASTY;  Surgeon: Vickey Huger, MD;  Location: [facility];  Service: Orthopedics;  Laterality: Left;  Marland Kitchen VARICOSE VEIN SURGERY Bilateral 2003   ligation and stripping    His family history includes Dementia in his father.     Outpatient Encounter Prescriptions as of 05/22/2016  Medication Sig  . meloxicam (MOBIC) 15 MG tablet TAKE ONE TABLET BY MOUTH UP TO ONCE A DAY AS NEEDED FOR PAIN  . [DISCONTINUED] enoxaparin (LOVENOX) 40 MG/0.4ML injection Inject 0.4 mLs (40 mg total) into the skin daily. (Patient not taking: Reported on 03/14/2016)  . [DISCONTINUED] ibuprofen (ADVIL,MOTRIN) 800 MG tablet Take 800 mg by mouth every 8 (eight) hours as needed for mild pain.  . [DISCONTINUED] methocarbamol (ROBAXIN) 500 MG tablet Take 1-2 tablets (500-1,000 mg total) by mouth every 6 (six) hours as needed for muscle spasms. (Patient not taking: Reported on 03/14/2016)  . [DISCONTINUED] oxyCODONE (ROXICODONE) 5 MG immediate release tablet Take 1 tablet (5 mg total) by mouth every 4 (four) hours as needed for severe pain.  . [DISCONTINUED] predniSONE (DELTASONE) 20 MG tablet Take 2 tablets (40 mg total) by mouth daily.   No facility-administered encounter medications on file as of 05/22/2016.     Patient Care Team: Janine Ores  Cranford Mon., MD as PCP - General (Family Medicine)      Objective:   Vitals:  Vitals:   05/22/16 1406  BP: 128/70  Pulse: 76  Resp: 16  Temp: 98 F (36.7 C)  TempSrc: Oral  Weight: 207 lb (93.9 kg)  Height: 5\' 8"  (1.727 m)    Physical Exam  Constitutional: He is oriented to person, place, and time. He appears well-developed and well-nourished.  HENT:  Head: Normocephalic and atraumatic.  Right Ear: External ear normal.  Left Ear: External ear normal.  Nose: Nose normal.  Mouth/Throat: Oropharynx is clear and moist.  Eyes: Conjunctivae and EOM are normal.  Pupils are equal, round, and reactive to light.  Neck: Normal range of motion. Neck supple.  Cardiovascular: Normal rate, regular rhythm, normal heart sounds and intact distal pulses.   Large VV in left lower calf.  Pulmonary/Chest: Effort normal and breath sounds normal.  Abdominal: Soft. Bowel sounds are normal.  Likely diathesis recti vs ventral hernia.  Genitourinary: Penis normal.  Musculoskeletal: Normal range of motion.  Very stiff in back.  Neurological: He is alert and oriented to person, place, and time. He has normal reflexes.  Skin: Skin is warm and dry.  Psychiatric: He has a normal mood and affect. His behavior is normal. Judgment and thought content normal.   Current Exercise Habits: Home exercise routine, Type of exercise: walking, Time (Minutes): 45, Frequency (Times/Week): 5, Weekly Exercise (Minutes/Week): 225, Intensity: Moderate    Functional Status Survey: Is the patient deaf or have difficulty hearing?: No Does the patient have difficulty seeing, even when wearing glasses/contacts?: No Does the patient have difficulty concentrating, remembering, or making decisions?: No Does the patient have difficulty walking or climbing stairs?: No Does the patient have difficulty dressing or bathing?: No Does the patient have difficulty doing errands alone such as visiting a doctor's office or shopping?: No   Depression Screen PHQ 2/9 Scores 05/22/2016 12/24/2014  PHQ - 2 Score 0 0    Fall Risk  05/22/2016 12/24/2014  Falls in the past year? Yes No  Number falls in past yr: 1 -  Injury with Fall? No -    Assessment & Plan:     Routine Health Maintenance and Physical Exam  Exercise Activities and Dietary recommendations Goals    None      Immunization History  Administered Date(s) Administered  . Tdap 03/04/2013    Health Maintenance  Topic Date Due  . Hepatitis C Screening  Dec 20, 1955  . HIV Screening  08/07/1971  . COLONOSCOPY  08/07/2006  . INFLUENZA  VACCINE  11/16/2015  . TETANUS/TDAP  03/05/2023     Discussed health benefits of physical activity, and encouraged him to engage in regular exercise appropriate for his age and condition.    1. Annual physical exam  - CBC with Differential/Platelet - Comprehensive metabolic panel - Lipid Panel With LDL/HDL Ratio - TSH - POCT urinalysis dipstick  2. Prostate cancer screening  - PSA  3. Colon cancer screening  - Ambulatory referral to Gastroenterology  4. Hematuria, unspecified type  - POCT UA - Microscopic Only  5. Erectile dysfunction, unspecified erectile dysfunction type  - sildenafil (REVATIO) 20 MG tablet; Take 1 tablet (20 mg total) by mouth 3 (three) times daily.  Dispense: 10 tablet; Refill: 0 6.Large Left Varicose Veins 7.s/p BTKR and recent Lumbar Laminectomy 8.S/p Umbilical Hernia repair  HPI, Exam and A&P Transcribed under the direction and in the presence of Wilhemena Durie., MD. Electronically  Signed: Althea Charon, RMA I have done the exam and reviewed the chart and it is accurate to the best of my knowledge. Development worker, community has been used and  any errors in dictation or transcription are unintentional. Miguel Aschoff M.D. [facility]

## 2016-05-27 LAB — COMPREHENSIVE METABOLIC PANEL
A/G RATIO: 1.8 (ref 1.2–2.2)
ALT: 27 IU/L (ref 0–44)
AST: 19 IU/L (ref 0–40)
Albumin: 4.4 g/dL (ref 3.5–5.5)
Alkaline Phosphatase: 55 IU/L (ref 39–117)
BUN/Creatinine Ratio: 16 (ref 9–20)
BUN: 16 mg/dL (ref 6–24)
Bilirubin Total: 0.8 mg/dL (ref 0.0–1.2)
CALCIUM: 9.4 mg/dL (ref 8.7–10.2)
CO2: 24 mmol/L (ref 18–29)
CREATININE: 1.01 mg/dL (ref 0.76–1.27)
Chloride: 101 mmol/L (ref 96–106)
GFR, EST AFRICAN AMERICAN: 94 mL/min/{1.73_m2} (ref >59)
GFR, EST NON AFRICAN AMERICAN: 81 mL/min/{1.73_m2} (ref >59)
GLOBULIN, TOTAL: 2.4 g/dL (ref 1.5–4.5)
Glucose: 103 mg/dL — ABNORMAL HIGH (ref 65–99)
POTASSIUM: 5.1 mmol/L (ref 3.5–5.2)
SODIUM: 142 mmol/L (ref 134–144)
TOTAL PROTEIN: 6.8 g/dL (ref 6.0–8.5)

## 2016-05-27 LAB — PSA: Prostate Specific Ag, Serum: 1.1 ng/mL (ref 0.0–4.0)

## 2016-05-27 LAB — CBC WITH DIFFERENTIAL/PLATELET
BASOS: 1 %
Basophils Absolute: 0 10*3/uL (ref 0.0–0.2)
EOS (ABSOLUTE): 0.2 10*3/uL (ref 0.0–0.4)
EOS: 3 %
HEMATOCRIT: 44.3 % (ref 37.5–51.0)
HEMOGLOBIN: 15.2 g/dL (ref 13.0–17.7)
IMMATURE GRANS (ABS): 0 10*3/uL (ref 0.0–0.1)
IMMATURE GRANULOCYTES: 0 %
LYMPHS: 29 %
Lymphocytes Absolute: 1.8 10*3/uL (ref 0.7–3.1)
MCH: 30.7 pg (ref 26.6–33.0)
MCHC: 34.3 g/dL (ref 31.5–35.7)
MCV: 90 fL (ref 79–97)
MONOCYTES: 7 %
Monocytes Absolute: 0.4 10*3/uL (ref 0.1–0.9)
Neutrophils Absolute: 3.8 10*3/uL (ref 1.4–7.0)
Neutrophils: 60 %
Platelets: 250 10*3/uL (ref 150–379)
RBC: 4.95 x10E6/uL (ref 4.14–5.80)
RDW: 14.4 % (ref 12.3–15.4)
WBC: 6.2 10*3/uL (ref 3.4–10.8)

## 2016-05-27 LAB — LIPID PANEL WITH LDL/HDL RATIO
Cholesterol, Total: 162 mg/dL (ref 100–199)
HDL: 66 mg/dL (ref >39)
LDL CALC: 84 mg/dL (ref 0–99)
LDL/HDL RATIO: 1.3 ratio (ref 0.0–3.6)
TRIGLYCERIDES: 60 mg/dL (ref 0–149)
VLDL Cholesterol Cal: 12 mg/dL (ref 5–40)

## 2016-05-27 LAB — TSH: TSH: 1.03 u[IU]/mL (ref 0.450–4.500)

## 2016-05-30 ENCOUNTER — Telehealth: Payer: Self-pay

## 2016-05-30 NOTE — Telephone Encounter (Signed)
[id number] 

## 2016-05-30 NOTE — Telephone Encounter (Signed)
Patient has been advised. KW 

## 2016-05-30 NOTE — Telephone Encounter (Signed)
-----   Message from Jerrol Banana., MD sent at 05/30/2016  8:25 AM EST ----- Labs all okay

## 2016-09-25 ENCOUNTER — Ambulatory Visit: Payer: Managed Care, Other (non HMO) | Admitting: Family Medicine

## 2017-01-15 ENCOUNTER — Encounter: Payer: Self-pay | Admitting: Family Medicine

## 2017-01-15 ENCOUNTER — Ambulatory Visit (INDEPENDENT_AMBULATORY_CARE_PROVIDER_SITE_OTHER): Payer: Managed Care, Other (non HMO) | Admitting: Family Medicine

## 2017-01-15 VITALS — BP 124/72 | HR 88 | Temp 98.3°F | Resp 14 | Wt 207.0 lb

## 2017-01-15 DIAGNOSIS — J019 Acute sinusitis, unspecified: Secondary | ICD-10-CM | POA: Diagnosis not present

## 2017-01-15 MED ORDER — AMOXICILLIN-POT CLAVULANATE 875-125 MG PO TABS: 1.0000 | ORAL_TABLET | Freq: Two times a day (BID) | ORAL | 0 refills | Status: DC

## 2017-01-15 NOTE — Progress Notes (Signed)
Patient: Stephen Sosa. Male    DOB: 1955/08/09   61 y.o.   MRN: [id number] Visit Date: 01/15/2017  Today's Provider: Wilhemena Durie, MD   Chief Complaint  Patient presents with  . Cough   Subjective:    HPI Pt is her for cough and congestion. He reports that it started about 4 days ago. He has sinus congestion that is green/yellow, cough with sputum production. He has some sinus pain and pressure and sore throat. He denies any fevers, chills but does say he feels a little achy. He is leaving Wednesday to go on vacation and wants to be better.     Allergies  Allergen Reactions  . Bee Venom Shortness Of Breath and Swelling  . Adhesive [Tape]     Redness and raw       Current Outpatient Prescriptions:  .  meloxicam (MOBIC) 15 MG tablet, TAKE ONE TABLET BY MOUTH UP TO ONCE A DAY AS NEEDED FOR PAIN, Disp: 90 tablet, Rfl: 3 .  sildenafil (REVATIO) 20 MG tablet, Take 1 tablet (20 mg total) by mouth 3 (three) times daily., Disp: 10 tablet, Rfl: 0  Review of Systems  Constitutional: Positive for fatigue.  HENT: Positive for congestion, postnasal drip, sinus pain, sinus pressure and sore throat.   Eyes: Negative.   Respiratory: Positive for cough.   Cardiovascular: Negative.   Gastrointestinal: Negative.   Endocrine: Negative.   Genitourinary: Negative.   Musculoskeletal: Negative.   Skin: Negative.   Allergic/Immunologic: Negative.   Neurological: Positive for headaches.  Hematological: Negative.   Psychiatric/Behavioral: Negative.     Social History  Substance Use Topics  . Smoking status: Never Smoker  . Smokeless tobacco: Never Used  . Alcohol use Yes     Comment: 1-2 beer daily   Objective:   BP 124/72 (BP Location: Left Arm, Patient Position: Sitting, Cuff Size: Normal)   Pulse 88   Temp 98.3 F (36.8 C) (Oral)   Resp 14   Wt 207 lb (93.9 kg)   SpO2 96%   BMI 31.47 kg/m  Vitals:   01/15/17 1554  BP: 124/72  Pulse: 88  Resp: 14  Temp:  98.3 F (36.8 C)  TempSrc: Oral  SpO2: 96%  Weight: 207 lb (93.9 kg)     Physical Exam  Constitutional: He is oriented to person, place, and time. He appears well-developed and well-nourished.  HENT:  Head: Normocephalic and atraumatic.  Right Ear: External ear normal.  Left Ear: External ear normal.  Nose: Nose normal.  Mouth/Throat: Oropharynx is clear and moist.  Eyes: Pupils are equal, round, and reactive to light. Conjunctivae and EOM are normal.  Neck: Normal range of motion. Neck supple.  Cardiovascular: Normal rate, regular rhythm, normal heart sounds and intact distal pulses.   Pulmonary/Chest: Effort normal and breath sounds normal.  Musculoskeletal: Normal range of motion.  Neurological: He is alert and oriented to person, place, and time. He has normal reflexes.  Skin: Skin is warm and dry.  Psychiatric: He has a normal mood and affect. His behavior is normal. Judgment and thought content normal.        Assessment & Plan:     1. Acute sinusitis, recurrence not specified, unspecified location Likely viral but since pt is going out of town, will treat as below.  - amoxicillin-clavulanate (AUGMENTIN) 875-125 MG tablet; Take 1 tablet by mouth 2 (two) times daily.  Dispense: 20 tablet; Refill: 0  HPI, Exam, and A&P Transcribed under the direction and in the presence of Dulce Martian L. Cranford Mon, MD  Electronically Signed: Katina Dung, CMA  I have done the exam and reviewed the above chart and it is accurate to the best of my knowledge. Development worker, community has been used in this note in any air is in the dictation or transcription are unintentional.  Wilhemena Durie, MD  [place]

## 2017-02-19 ENCOUNTER — Encounter: Payer: Self-pay | Admitting: Family Medicine

## 2017-02-19 ENCOUNTER — Ambulatory Visit (INDEPENDENT_AMBULATORY_CARE_PROVIDER_SITE_OTHER): Payer: Managed Care, Other (non HMO) | Admitting: Family Medicine

## 2017-02-19 VITALS — BP 132/78 | HR 86 | Temp 99.1°F | Resp 16 | Wt 208.0 lb

## 2017-02-19 DIAGNOSIS — K529 Noninfective gastroenteritis and colitis, unspecified: Secondary | ICD-10-CM | POA: Diagnosis not present

## 2017-02-19 DIAGNOSIS — J40 Bronchitis, not specified as acute or chronic: Secondary | ICD-10-CM

## 2017-02-19 MED ORDER — ONDANSETRON HCL 8 MG PO TABS: 8.0000 mg | ORAL_TABLET | Freq: Three times a day (TID) | ORAL | 0 refills | Status: DC | PRN

## 2017-02-19 NOTE — Patient Instructions (Signed)
Discussed use of imodium. Keep up with fluids with Gatorade and eat as tolerated. Let me know Wednesday AM how how your are doing with the stomach bug and the cough.

## 2017-02-19 NOTE — Progress Notes (Signed)
Subjective:     Patient ID: [name], male   DOB: October 01, 1955, 61 y.o.   MRN: [id number]  HPI  Chief Complaint  Patient presents with  . Nausea    Patient comes in office today with complaints of nausea and diarrhea for the past 24hrs. Patient decribes stool as soft ad yellowish green.   . Cough    Patient reports productive cough with mucous for the past 2 weeks.   States cough has yellowish sputum. Reports exposure to his grandchild's stool while changing her. Reports stool is now watery with accompanying nausea.   Review of Systems     Objective:   Physical Exam  Constitutional: He appears well-developed and well-nourished. No distress.  Abdominal: Soft. There is no tenderness.  Ears: T.M's intact without inflammation Throat: no tonsillar enlargement or exudate Neck: no cervical adenopathy Lungs: clear     Assessment:    1. Bronchitis: monitor for now  2. Gastroenteritis presumed infectious: Zofran as needed    Plan:    Discussed use of imodium and increased fluids. Phone f/u in two day. Expect spontaneous improvement of sx.

## 2017-02-21 ENCOUNTER — Other Ambulatory Visit: Payer: Self-pay | Admitting: Family Medicine

## 2017-02-21 DIAGNOSIS — J40 Bronchitis, not specified as acute or chronic: Secondary | ICD-10-CM

## 2017-02-21 MED ORDER — DOXYCYCLINE HYCLATE 100 MG PO TABS: 100.0000 mg | ORAL_TABLET | Freq: Two times a day (BID) | ORAL | 0 refills | Status: DC

## 2017-02-28 ENCOUNTER — Other Ambulatory Visit: Payer: Self-pay | Admitting: Family Medicine

## 2017-02-28 ENCOUNTER — Telehealth: Payer: Self-pay | Admitting: Family Medicine

## 2017-02-28 DIAGNOSIS — J40 Bronchitis, not specified as acute or chronic: Secondary | ICD-10-CM

## 2017-02-28 MED ORDER — CEFDINIR 300 MG PO CAPS: 300.0000 mg | ORAL_CAPSULE | Freq: Two times a day (BID) | ORAL | 0 refills | Status: DC

## 2017-02-28 NOTE — Telephone Encounter (Signed)
Please review last office note and advise. KW

## 2017-02-28 NOTE — Telephone Encounter (Signed)
Pt states he seen Bob on 02/19/17 for cough and congestion.  Pt states he still has chest congestion and a cough.  Pt states he still has 2 days left of the antibiotic.  Please advise.  EK#[phone]/BP

## 2017-02-28 NOTE — Telephone Encounter (Signed)
Reports continued purulent appearing sputum and clear PND. Will stop doxycycline and switch to cefdinir. Benadryl at bedtime to help drainage and sleep.

## 2017-06-17 ENCOUNTER — Other Ambulatory Visit: Payer: Self-pay | Admitting: Family Medicine

## 2017-06-17 DIAGNOSIS — M1990 Unspecified osteoarthritis, unspecified site: Secondary | ICD-10-CM

## 2017-07-26 ENCOUNTER — Emergency Department (HOSPITAL_COMMUNITY): Payer: Managed Care, Other (non HMO)

## 2017-07-26 ENCOUNTER — Other Ambulatory Visit: Payer: Self-pay

## 2017-07-26 ENCOUNTER — Emergency Department (HOSPITAL_COMMUNITY)
Admission: EM | Admit: 2017-07-26 | Discharge: 2017-07-26 | Disposition: A | Discharge status: Home / Self Care | Payer: Managed Care, Other (non HMO) | Attending: Emergency Medicine | Admitting: Emergency Medicine

## 2017-07-26 ENCOUNTER — Encounter (HOSPITAL_COMMUNITY): Payer: Self-pay | Admitting: Emergency Medicine

## 2017-07-26 DIAGNOSIS — R1032 Left lower quadrant pain: Secondary | ICD-10-CM | POA: Diagnosis present

## 2017-07-26 DIAGNOSIS — Z79899 Other long term (current) drug therapy: Secondary | ICD-10-CM | POA: Insufficient documentation

## 2017-07-26 DIAGNOSIS — N201 Calculus of ureter: Secondary | ICD-10-CM

## 2017-07-26 DIAGNOSIS — Z96653 Presence of artificial knee joint, bilateral: Secondary | ICD-10-CM | POA: Diagnosis not present

## 2017-07-26 LAB — CBC
HCT: 51.3 % (ref 39.0–52.0)
Hemoglobin: 17.2 g/dL — ABNORMAL HIGH (ref 13.0–17.0)
MCH: 31.2 pg (ref 26.0–34.0)
MCHC: 33.5 g/dL (ref 30.0–36.0)
MCV: 92.9 fL (ref 78.0–100.0)
PLATELETS: 193 10*3/uL (ref 150–400)
RBC: 5.52 MIL/uL (ref 4.22–5.81)
RDW: 13.2 % (ref 11.5–15.5)
WBC: 5.3 10*3/uL (ref 4.0–10.5)

## 2017-07-26 LAB — COMPREHENSIVE METABOLIC PANEL
ALBUMIN: 4.2 g/dL (ref 3.5–5.0)
ALK PHOS: 49 U/L (ref 38–126)
ALT: 28 U/L (ref 17–63)
AST: 25 U/L (ref 15–41)
Anion gap: 12 (ref 5–15)
BILIRUBIN TOTAL: 0.8 mg/dL (ref 0.3–1.2)
BUN: 14 mg/dL (ref 6–20)
CALCIUM: 9.1 mg/dL (ref 8.9–10.3)
CO2: 25 mmol/L (ref 22–32)
CREATININE: 1.37 mg/dL — AB (ref 0.61–1.24)
Chloride: 102 mmol/L (ref 101–111)
GFR calc Af Amer: >60 mL/min (ref >60)
GFR, EST NON AFRICAN AMERICAN: 55 mL/min — AB (ref >60)
GLUCOSE: 120 mg/dL — AB (ref 65–99)
Potassium: 4.7 mmol/L (ref 3.5–5.1)
Sodium: 139 mmol/L (ref 135–145)
TOTAL PROTEIN: 6.8 g/dL (ref 6.5–8.1)

## 2017-07-26 LAB — URINALYSIS, ROUTINE W REFLEX MICROSCOPIC
BILIRUBIN URINE: NEGATIVE
Bacteria, UA: NONE SEEN
Glucose, UA: NEGATIVE mg/dL
Ketones, ur: NEGATIVE mg/dL
LEUKOCYTES UA: NEGATIVE
NITRITE: NEGATIVE
PH: 6 (ref 5.0–8.0)
Protein, ur: NEGATIVE mg/dL
SPECIFIC GRAVITY, URINE: 1.014 (ref 1.005–1.030)
Squamous Epithelial / LPF: NONE SEEN

## 2017-07-26 LAB — LIPASE, BLOOD: Lipase: 32 U/L (ref 11–51)

## 2017-07-26 MED ORDER — FENTANYL CITRATE (PF) 100 MCG/2ML IJ SOLN
50.0000 ug | INTRAMUSCULAR | Status: DC | PRN
  Administered 2017-07-26: 50 ug via NASAL
  Filled 2017-07-26: qty 2

## 2017-07-26 MED ORDER — SODIUM CHLORIDE 0.9 % IV BOLUS
1000.0000 mL | Freq: Once | INTRAVENOUS | Status: AC
  Administered 2017-07-26: 1000 mL via INTRAVENOUS

## 2017-07-26 MED ORDER — MORPHINE SULFATE (PF) 4 MG/ML IV SOLN
4.0000 mg | Freq: Once | INTRAVENOUS | Status: AC
Start: 2017-07-26 — End: 2017-07-26
  Administered 2017-07-26: 4 mg via INTRAVENOUS
  Filled 2017-07-26: qty 1

## 2017-07-26 MED ORDER — ONDANSETRON HCL 4 MG/2ML IJ SOLN
4.0000 mg | Freq: Once | INTRAMUSCULAR | Status: AC
  Administered 2017-07-26: 4 mg via INTRAVENOUS
  Filled 2017-07-26: qty 2

## 2017-07-26 MED ORDER — HYDROCODONE-ACETAMINOPHEN 5-325 MG PO TABS: 1.0000 | ORAL_TABLET | ORAL | 0 refills | Status: DC | PRN

## 2017-07-26 NOTE — ED Triage Notes (Signed)
Patient reports left lateral abdominal pain onset this morning with mild dysuria , no emesis or diarrhea , no fever or chills .

## 2017-07-26 NOTE — ED Provider Notes (Signed)
[facility] EMERGENCY DEPARTMENT Provider Note   CSN: [id number] Arrival date & time: 07/26/17  0609     History   Chief Complaint Chief Complaint  Patient presents with  . Abdominal Pain    HPI Stephen Sosa. is a 62 y.o. male.  HPI  62 year old male presents with left lower quadrant abdominal pain.  Started around 4 AM.  He was up already when it started and it has progressively worsened.  He describes as a sharp pain.  It does not radiate and there is no back pain.  He has not noticed dysuria or hematuria or trouble urinating.  No nausea, vomiting, diarrhea or constipation.  No fevers. Pain is 7/10.  Past Medical History:  Diagnosis Date  . Arthritis   . Headache    rarely  . Joint pain   . Joint swelling   . Macular degeneration    dry  . Phlebitis     Patient Active Problem List   Diagnosis Date Noted  . 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 Arthritis 12/24/2014    Past Surgical History:  Procedure Laterality Date  . HERNIA REPAIR  2703   umbilical hernia  . JOINT REPLACEMENT Right 2005   knee  . KNEE ARTHROSCOPY Left   . TOTAL KNEE ARTHROPLASTY Left 01/25/2015   Procedure: TOTAL KNEE ARTHROPLASTY;  Surgeon: Vickey Huger, MD;  Location: [facility];  Service: Orthopedics;  Laterality: Left;  Marland Kitchen VARICOSE VEIN SURGERY Bilateral 2003   ligation and stripping        Home Medications    Prior to Admission medications   Medication Sig Start Date End Date Taking? Authorizing Provider  augmented betamethasone dipropionate (DIPROLENE-AF) 0.05 % ointment APPLY TO AFFECTED AREAS ON HANDS AND FEET TWICE DAILY UNTIL IMPROVED THEN AS NEEDED 01/23/17   [provider]  cefdinir (OMNICEF) 300 MG capsule Take 1 capsule (300 mg total) 2 (two) times daily by mouth. 02/28/17   Carmon Ginsberg, PA  doxycycline (VIBRA-TABS) 100 MG tablet Take 1 tablet (100 mg total) 2 (two) times daily by mouth. 02/21/17   Carmon Ginsberg, PA    HYDROcodone-acetaminophen (NORCO) 5-325 MG tablet Take 1 tablet by mouth every 4 (four) hours as needed for severe pain. 07/26/17   Sherwood Gambler, MD  meloxicam (MOBIC) 15 MG tablet TAKE 1 TABLET BY MOUTH UP TO ONCE A DAY AS NEEDED FOR PAIN 06/18/17   Jerrol Banana., MD  ondansetron (ZOFRAN) 8 MG tablet Take 1 tablet (8 mg total) every 8 (eight) hours as needed by mouth for nausea or vomiting. 02/19/17   Carmon Ginsberg, PA  sildenafil (REVATIO) 20 MG tablet Take 1 tablet (20 mg total) by mouth 3 (three) times daily. 05/22/16   Jerrol Banana., MD    Family History Family History  Problem Relation Age of Onset  . Dementia Father     Social History Social History   Tobacco Use  . Smoking status: Never Smoker  . Smokeless tobacco: Never Used  Substance Use Topics  . Alcohol use: Yes    Comment: 1-2 beer daily  . Drug use: No     Allergies   Bee venom and Adhesive [tape]   Review of Systems Review of Systems  Constitutional: Negative for fever.  Gastrointestinal: Positive for abdominal pain. Negative for constipation, diarrhea, nausea and vomiting.  Genitourinary: Negative for dysuria and hematuria.  Musculoskeletal: Negative for back pain.  All other systems reviewed and are negative.  Physical Exam Updated Vital Signs BP 128/80   Pulse 70   Temp 98.2 F (36.8 C) (Oral)   Resp 11   Ht 5\' 8"  (1.727 m)   Wt 94.3 kg (208 lb)   SpO2 100%   BMI 31.63 kg/m   Physical Exam  Constitutional: He is oriented to person, place, and time. He appears well-developed and well-nourished.  Non-toxic appearance. He does not appear ill.  HENT:  Head: Normocephalic and atraumatic.  Right Ear: External ear normal.  Left Ear: External ear normal.  Nose: Nose normal.  Eyes: Right eye exhibits no discharge. Left eye exhibits no discharge.  Neck: Neck supple.  Cardiovascular: Normal rate, regular rhythm and normal heart sounds.  Pulmonary/Chest: Effort normal and  breath sounds normal.  Abdominal: Soft. There is tenderness in the left upper quadrant and left lower quadrant. Hernia confirmed negative in the left inguinal area.  Genitourinary: Right testis shows no swelling and no tenderness. Left testis shows no swelling and no tenderness.  Musculoskeletal: He exhibits no edema.  Neurological: He is alert and oriented to person, place, and time.  Skin: Skin is warm and dry.  Nursing note and vitals reviewed.    ED Treatments / Results  Labs (all labs ordered are listed, but only abnormal results are displayed) Labs Reviewed  COMPREHENSIVE METABOLIC PANEL - Abnormal; Notable for the following components:      Result Value   Glucose, Bld 120 (*)    Creatinine, Ser 1.37 (*)    GFR calc non Af Amer 55 (*)    All other components within normal limits  CBC - Abnormal; Notable for the following components:   Hemoglobin 17.2 (*)    All other components within normal limits  URINALYSIS, ROUTINE W REFLEX MICROSCOPIC - Abnormal; Notable for the following components:   Hgb urine dipstick MODERATE (*)    All other components within normal limits  LIPASE, BLOOD    EKG EKG Interpretation  Date/Time:  Thursday July 26 2017 07:41:15 EDT Ventricular Rate:  55 PR Interval:    QRS Duration: 99 QT Interval:  440 QTC Calculation: 421 R Axis:   53 Text Interpretation:  Sinus bradycardia no acute ST/T changes no significant change since 2010 Confirmed by Sherwood Gambler [phone]) on 07/26/2017 7:55:19 AM   Radiology Ct Renal Stone Study  Result Date: 07/26/2017 CLINICAL DATA:  Left flank pain EXAM: CT ABDOMEN AND PELVIS WITHOUT CONTRAST TECHNIQUE: Multidetector CT imaging of the abdomen and pelvis was performed following the standard protocol without IV contrast. COMPARISON:  None. FINDINGS: Lower chest: Dependent atelectasis in the lower lobes. Heart is normal size. No effusions. Hepatobiliary: No focal hepatic abnormality. Gallbladder unremarkable.  Pancreas: No focal abnormality or ductal dilatation. Spleen: Calcification within the spleen.  Normal size. Adrenals/Urinary Tract: Mild left hydronephrosis and hydroureter due to a 3 mm left UVJ stone. No hydronephrosis or stones on the right. Adrenal glands and urinary bladder unremarkable. Stomach/Bowel: Appendix is normal. Stomach, large and small bowel grossly unremarkable. Vascular/Lymphatic: Aortic atherosclerosis. No enlarged abdominal or pelvic lymph nodes. Reproductive: Prostate calcifications and mild prominence of the prostate. Other: No free fluid or free air. Musculoskeletal: No acute bony abnormality IMPRESSION: 3 mm left UVJ stone with mild left hydronephrosis. Mildly prominent prostate with central calcifications. Minimal dependent atelectasis. Electronically Signed   By: Rolm Baptise M.D.   On: 07/26/2017 08:38    Procedures Procedures (including critical care time)  Medications Ordered in ED Medications  morphine 4 MG/ML injection 4 mg (  4 mg Intravenous Given 07/26/17 0742)  ondansetron (ZOFRAN) injection 4 mg (4 mg Intravenous Given 07/26/17 0748)  sodium chloride 0.9 % bolus 1,000 mL (0 mLs Intravenous Stopped 07/26/17 0851)     Initial Impression / Assessment and Plan / ED Course  I have reviewed the triage vital signs and the nursing notes.  Pertinent labs & imaging results that were available during my care of the patient were reviewed by me and considered in my medical decision making (see chart for details).     CT scan confirms left ureteral stone.  It is at UVJ.  His pain is much better after a dose of IV morphine.  Given his pain is controlled I think he is stable for discharge home.  He has no urinary tract infection symptoms.  No further vomiting.  He appears stable for discharge home with outpatient urology follow-up.  Discussed return precautions.  Final Clinical Impressions(s) / ED Diagnoses   Final diagnoses:  Left ureteral stone    ED Discharge Orders         Ordered    HYDROcodone-acetaminophen (NORCO) 5-325 MG tablet  Every 4 hours PRN     07/26/17 0915       Sherwood Gambler, MD 07/26/17 1648

## 2017-09-14 ENCOUNTER — Inpatient Hospital Stay
Admit: 2017-09-14 | Discharge: 2017-09-15 | Disposition: A | Discharge status: Home / Self Care | Payer: Charity | Attending: Emergency Medicine

## 2017-09-14 ENCOUNTER — Emergency Department: Admit: 2017-09-14 | Payer: Charity | Primary: Family Medicine

## 2017-09-14 DIAGNOSIS — R0602 Shortness of breath: Secondary | ICD-10-CM

## 2017-09-14 LAB — CBC WITH AUTOMATED DIFF
ABS. BASOPHILS: 0 10*3/uL (ref 0.0–0.2)
ABS. EOSINOPHILS: 0.2 10*3/uL (ref 0.0–0.8)
ABS. IMM. GRANS.: 0 10*3/uL (ref 0.0–0.5)
ABS. LYMPHOCYTES: 1.4 10*3/uL (ref 0.5–4.6)
ABS. MONOCYTES: 0.7 10*3/uL (ref 0.1–1.3)
ABS. NEUTROPHILS: 7.3 10*3/uL (ref 1.7–8.2)
ABSOLUTE NRBC: 0 10*3/uL (ref 0.0–0.2)
BASOPHILS: 0 % (ref 0.0–2.0)
EOSINOPHILS: 2 % (ref 0.5–7.8)
HCT: 45.3 % (ref 41.1–50.3)
HGB: 14.6 g/dL (ref 13.6–17.2)
IMMATURE GRANULOCYTES: 0 % (ref 0.0–5.0)
LYMPHOCYTES: 14 % (ref 13–44)
MCH: 27.5 PG (ref 26.1–32.9)
MCHC: 32.2 g/dL (ref 31.4–35.0)
MCV: 85.5 FL (ref 79.6–97.8)
MONOCYTES: 8 % (ref 4.0–12.0)
MPV: 12 FL (ref 9.4–12.3)
NEUTROPHILS: 76 % (ref 43–78)
PLATELET: 238 10*3/uL (ref 150–450)
RBC: 5.3 M/uL (ref 4.23–5.6)
RDW: 15.8 % — ABNORMAL HIGH (ref 11.9–14.6)
WBC: 9.6 10*3/uL (ref 4.3–11.1)

## 2017-09-14 LAB — METABOLIC PANEL, COMPREHENSIVE
A-G Ratio: 1.1 — ABNORMAL LOW (ref 1.2–3.5)
ALT (SGPT): 53 U/L (ref 12–65)
AST (SGOT): 41 U/L — ABNORMAL HIGH (ref 15–37)
Albumin: 4 g/dL (ref 3.2–4.6)
Alk. phosphatase: 111 U/L (ref 50–136)
Anion gap: 13 mmol/L (ref 7–16)
BUN: 24 MG/DL — ABNORMAL HIGH (ref 8–23)
Bilirubin, total: 0.6 MG/DL (ref 0.2–1.1)
CO2: 22 mmol/L (ref 21–32)
Calcium: 8.9 MG/DL (ref 8.3–10.4)
Chloride: 103 mmol/L (ref 98–107)
Creatinine: 1.1 MG/DL (ref 0.8–1.5)
GFR est AA: >60 mL/min/{1.73_m2} (ref >60)
GFR est non-AA: >60 mL/min/{1.73_m2} (ref >60)
Globulin: 3.6 g/dL — ABNORMAL HIGH (ref 2.3–3.5)
Glucose: 95 mg/dL (ref 65–100)
Potassium: 4.7 mmol/L (ref 3.5–5.1)
Protein, total: 7.6 g/dL (ref 6.3–8.2)
Sodium: 138 mmol/L (ref 136–145)

## 2017-09-14 LAB — MAGNESIUM
Magnesium: 2.3 mg/dL (ref 1.8–2.4)
Magnesium: 2.3 mg/dL (ref 1.8–2.4)

## 2017-09-14 LAB — POC TROPONIN
TROPONIN-I (POC): 0 ng/ml — ABNORMAL LOW (ref 0.02–0.05)
Troponin-I (POC): 0 ng/ml — ABNORMAL LOW (ref 0.02–0.05)

## 2017-09-14 LAB — BNP: BNP: 513 pg/mL

## 2017-09-14 LAB — BRAIN NATRIURETIC PEPTIDE: BNP: 513 pg/mL

## 2017-09-14 LAB — CBC WITH AUTO DIFFERENTIAL
Basophils %: 0 % (ref 0.0–2.0)
Basophils Absolute: 0 10*3/uL (ref 0.0–0.2)
Eosinophils %: 2 % (ref 0.5–7.8)
Eosinophils Absolute: 0.2 10*3/uL (ref 0.0–0.8)
Granulocyte Absolute Count: 0 10*3/uL (ref 0.0–0.5)
Hematocrit: 45.3 % (ref 41.1–50.3)
Hemoglobin: 14.6 g/dL (ref 13.6–17.2)
Immature Granulocytes: 0 % (ref 0.0–5.0)
Lymphocytes %: 14 % (ref 13–44)
Lymphocytes Absolute: 1.4 10*3/uL (ref 0.5–4.6)
MCH: 27.5 PG (ref 26.1–32.9)
MCHC: 32.2 g/dL (ref 31.4–35.0)
MCV: 85.5 FL (ref 79.6–97.8)
MPV: 12 FL (ref 9.4–12.3)
Monocytes %: 8 % (ref 4.0–12.0)
Monocytes Absolute: 0.7 10*3/uL (ref 0.1–1.3)
NRBC Absolute: 0 10*3/uL (ref 0.0–0.2)
Neutrophils %: 76 % (ref 43–78)
Neutrophils Absolute: 7.3 10*3/uL (ref 1.7–8.2)
Platelets: 238 10*3/uL (ref 150–450)
RBC: 5.3 M/uL (ref 4.23–5.6)
RDW: 15.8 % — ABNORMAL HIGH (ref 11.9–14.6)
WBC: 9.6 10*3/uL (ref 4.3–11.1)

## 2017-09-14 LAB — COMPREHENSIVE METABOLIC PANEL
ALT: 53 U/L (ref 12–65)
AST: 41 U/L — ABNORMAL HIGH (ref 15–37)
Albumin/Globulin Ratio: 1.1 — ABNORMAL LOW (ref 1.2–3.5)
Albumin: 4 g/dL (ref 3.2–4.6)
Alkaline Phosphatase: 111 U/L (ref 50–136)
Anion Gap: 13 mmol/L (ref 7–16)
BUN: 24 MG/DL — ABNORMAL HIGH (ref 8–23)
CO2: 22 mmol/L (ref 21–32)
Calcium: 8.9 MG/DL (ref 8.3–10.4)
Chloride: 103 mmol/L (ref 98–107)
Creatinine: 1.1 MG/DL (ref 0.8–1.5)
EGFR IF NonAfrican American: >60 mL/min/{1.73_m2} (ref >60)
GFR African American: >60 mL/min/{1.73_m2} (ref >60)
Globulin: 3.6 g/dL — ABNORMAL HIGH (ref 2.3–3.5)
Glucose: 95 mg/dL (ref 65–100)
Potassium: 4.7 mmol/L (ref 3.5–5.1)
Sodium: 138 mmol/L (ref 136–145)
Total Bilirubin: 0.6 MG/DL (ref 0.2–1.1)
Total Protein: 7.6 g/dL (ref 6.3–8.2)

## 2017-09-14 MED ORDER — FUROSEMIDE 10 MG/ML IJ SOLN
10 mg/mL | INTRAMUSCULAR | Status: AC
Start: 2017-09-14 — End: 2017-09-14
  Administered 2017-09-14: via INTRAVENOUS

## 2017-09-14 MED ORDER — NITROGLYCERIN 2 % TRANSDERMAL OINTMENT
2 % | TRANSDERMAL | Status: AC
Start: 2017-09-14 — End: 2017-09-14
  Administered 2017-09-14: via TOPICAL

## 2017-09-14 MED FILL — NITRO-BID 2 % TRANSDERMAL OINTMENT: 2 % | TRANSDERMAL | Qty: 1

## 2017-09-14 MED FILL — FUROSEMIDE 10 MG/ML IJ SOLN: 10 mg/mL | INTRAMUSCULAR | Qty: 4

## 2017-09-14 NOTE — ED Notes (Signed)
 I have reviewed discharge instructions with the patient.  The patient verbalized understanding.    Patient left ED via Discharge Method: ambulatory to Home with wife.    Opportunity for questions and clarification provided.       Patient given 2 scripts.         To continue your aftercare when you leave the hospital, you may receive an automated call from our care team to check in on how you are doing.  This is a free service and part of our promise to provide the best care and service to meet your aftercare needs." If you have questions, or wish to unsubscribe from this service please call [phone].  Thank you for Choosing our [facility] Emergency Department.

## 2017-09-14 NOTE — ED Notes (Signed)
Pt to ED with wife c/o increased shortness of breath. [place]. Pt has developed A-fib and currently takes eliquis and metoprolol for the a-fib. Pt followed up with Family practice this past week. Has not seen cardiology. No LE edema noted.

## 2017-09-14 NOTE — ED Provider Notes (Signed)
62 year old gentleman with progressive history of shortness of breath orthopnea and dyspnea on exertion and some edema in his ankles for about 2 weeks.  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  He's had no fever.  No sputum.  No hemoptysis.  Denies any pain.  History of coronary artery disease with stenting in the past all of hypertension.  Atrial fibrillation was discovered with his stroke 2 weeks if    The history is provided by the patient.   Shortness of Breath   This is a new problem. The current episode started more than 1 week ago. The problem has been gradually worsening. Associated symptoms include cough. Pertinent negatives include no fever, no headaches, no neck pain, no sputum production, no hemoptysis, no wheezing, no chest pain, no vomiting, no abdominal pain, no rash and no leg pain. He has tried nothing for the symptoms. Associated medical issues include CAD and past MI. Associated medical issues do not include COPD, PE, heart failure or DVT.   Rapid Heart Rate   Associated symptoms include shortness of breath. Pertinent negatives include no chest pain, no abdominal pain and no headaches.        Past Medical History:   Diagnosis Date   ??? CAD (coronary artery disease)    ??? CVA (cerebral vascular accident) ([place]) 09/05/2017   ??? Dyslipidemia    ??? Hypertension    ??? Unstable angina ([place]) 02/02/2010       Past Surgical History:   Procedure Laterality Date   ??? CARDIAC SURG PROCEDURE UNLIST  11/2009    stent x1 LAD    ??? PR LEFT HEART CATH,PERCUTANEOUS  02/02/2010    stent x1         History reviewed. No pertinent family history.    Social History     Socioeconomic History   ??? Marital status: MARRIED     Spouse name: Not on file   ??? Number of children: Not on file   ??? Years of education: Not on file   ??? Highest education level: Not on file   Occupational History   ??? Not on file   Social Needs   ??? Financial resource strain: Not on file   ??? Food insecurity:     Worry: Not on  file     Inability: Not on file   ??? Transportation needs:     Medical: Not on file     Non-medical: Not on file   Tobacco Use   ??? Smoking status: Never Smoker   ??? Smokeless tobacco: Never Used   Substance and Sexual Activity   ??? Alcohol use: No   ??? Drug use: Not on file   ??? Sexual activity: Not on file   Lifestyle   ??? Physical activity:     Days per week: Not on file     Minutes per session: Not on file   ??? Stress: Not on file   Relationships   ??? Social connections:     Talks on phone: Not on file     Gets together: Not on file     Attends religious service: Not on file     Active member of club or organization: Not on file     Attends meetings of clubs or organizations: Not on file     Relationship status: Not on file   ??? Intimate partner violence:     Fear of current or ex partner: Not on file  Emotionally abused: Not on file     Physically abused: Not on file     Forced sexual activity: Not on file   Other Topics Concern   ??? Not on file   Social History Narrative   ??? Not on file         ALLERGIES: Aspirin and Tylenol [acetaminophen]    Review of Systems   Constitutional: Negative for chills and fever.   Respiratory: Positive for cough and shortness of breath. Negative for hemoptysis, sputum production and wheezing.    Cardiovascular: Negative for chest pain and palpitations.   Gastrointestinal: Negative for abdominal pain, diarrhea and vomiting.   Genitourinary: Negative for dysuria and flank pain.   Musculoskeletal: Negative for back pain and neck pain.   Skin: Negative for color change and rash.   Neurological: Negative for syncope and headaches.   All other systems reviewed and are negative.      Vitals:    09/14/17 1822   BP: (!) 147/107   Pulse: (!) 114   Resp: 18   Temp: 98.2 ??F (36.8 ??C)   SpO2: 97%   Weight: 117.5 kg (259 lb)   Height: 6' 4" (1.93 m)            Physical Exam   Constitutional: He is oriented to person, place, and time. He appears well-developed and well-nourished. No distress.   HENT:    Head: Normocephalic and atraumatic.   Right Ear: External ear normal.   Left Ear: External ear normal.   Mouth/Throat: Oropharynx is clear and moist. No oropharyngeal exudate.   Eyes: Pupils are equal, round, and reactive to light. Conjunctivae and EOM are normal.   Neck: Normal range of motion. Neck supple.   Cardiovascular: Normal rate, regular rhythm and intact distal pulses.   No murmur heard.  Pulmonary/Chest: No respiratory distress. He has rales.   Abdominal: Soft. Bowel sounds are normal. He exhibits no mass. There is no tenderness. There is no rebound and no guarding. No hernia.   Musculoskeletal:        Right lower leg: He exhibits edema (2+ pitting).        Left lower leg: He exhibits edema (2+ pitting).   Neurological: He is alert and oriented to person, place, and time. Gait normal.   Nl speech   Skin: Skin is warm and dry.   Psychiatric: He has a normal mood and affect. His speech is normal.   Nursing note and vitals reviewed.       MDM  Number of Diagnoses or Management Options  Diagnosis management comments: Assessment congestive heart failure, pneumonia, fluid overload, recurrent ischemia       Amount and/or Complexity of Data Reviewed  Clinical lab tests: ordered and reviewed  Tests in the radiology section of CPT??: ordered and reviewed  Tests in the medicine section of CPT??: ordered and reviewed  Review and summarize past medical records: yes (Remote anterior MI about 6 years ago.  Stenting.)  Independent visualization of images, tracings, or specimens: yes (EKG shows atrial fibrillation without ST T changes.  Slightly fast heart rate of 110.)    Risk of Complications, Morbidity, and/or Mortality  Presenting problems: moderate  Diagnostic procedures: low  Management options: moderate    Patient Progress  Patient progress: stable         Procedures    Results Include:    Recent Results (from the past 24 hour(s))   EKG, 12 LEAD, INITIAL    Collection Time:  09/14/17  6:33 PM   Result Value Ref  Range    Ventricular Rate 109 BPM    Atrial Rate 394 BPM    QRS Duration 100 ms    Q-T Interval 326 ms    QTC Calculation (Bezet) 439 ms    Calculated R Axis 32 degrees    Calculated T Axis 121 degrees    Diagnosis       !! AGE AND GENDER SPECIFIC ECG ANALYSIS !!  Atrial fibrillation with rapid ventricular response  Anteroseptal infarct (cited on or before 06-Dec-2009)  Abnormal ECG  When compared with ECG of 20-Sep-2010 08:13,  Atrial fibrillation has replaced Sinus rhythm  Vent. rate has increased BY  51 BPM  T wave amplitude has decreased in Inferior leads  Nonspecific T wave abnormality now evident in Lateral leads     POC TROPONIN-I    Collection Time: 09/14/17  6:47 PM   Result Value Ref Range    Troponin-I (POC) 0 (L) 0.02 - 0.05 ng/ml   CBC WITH AUTOMATED DIFF    Collection Time: 09/14/17  6:49 PM   Result Value Ref Range    WBC 9.6 4.3 - 11.1 K/uL    RBC 5.30 4.23 - 5.6 M/uL    HGB 14.6 13.6 - 17.2 g/dL    HCT 45.3 41.1 - 50.3 %    MCV 85.5 79.6 - 97.8 FL    MCH 27.5 26.1 - 32.9 PG    MCHC 32.2 31.4 - 35.0 g/dL    RDW 15.8 (H) 11.9 - 14.6 %    PLATELET 238 150 - 450 K/uL    MPV 12.0 9.4 - 12.3 FL    ABSOLUTE NRBC 0.00 0.0 - 0.2 K/uL    DF AUTOMATED      NEUTROPHILS 76 43 - 78 %    LYMPHOCYTES 14 13 - 44 %    MONOCYTES 8 4.0 - 12.0 %    EOSINOPHILS 2 0.5 - 7.8 %    BASOPHILS 0 0.0 - 2.0 %    IMMATURE GRANULOCYTES 0 0.0 - 5.0 %    ABS. NEUTROPHILS 7.3 1.7 - 8.2 K/UL    ABS. LYMPHOCYTES 1.4 0.5 - 4.6 K/UL    ABS. MONOCYTES 0.7 0.1 - 1.3 K/UL    ABS. EOSINOPHILS 0.2 0.0 - 0.8 K/UL    ABS. BASOPHILS 0.0 0.0 - 0.2 K/UL    ABS. IMM. GRANS. 0.0 0.0 - 0.5 K/UL   METABOLIC PANEL, COMPREHENSIVE    Collection Time: 09/14/17  6:49 PM   Result Value Ref Range    Sodium 138 136 - 145 mmol/L    Potassium 4.7 3.5 - 5.1 mmol/L    Chloride 103 98 - 107 mmol/L    CO2 22 21 - 32 mmol/L    Anion gap 13 7 - 16 mmol/L    Glucose 95 65 - 100 mg/dL    BUN 24 (H) 8 - 23 MG/DL    Creatinine 1.10 0.8 - 1.5 MG/DL    GFR est AA >60 >60  ml/min/1.81m    GFR est non-AA >60 >60 ml/min/1.765m   Calcium 8.9 8.3 - 10.4 MG/DL    Bilirubin, total 0.6 0.2 - 1.1 MG/DL    ALT (SGPT) 53 12 - 65 U/L    AST (SGOT) 41 (H) 15 - 37 U/L    Alk. phosphatase 111 50 - 136 U/L    Protein, total 7.6 6.3 - 8.2 g/dL    Albumin 4.0 3.2 - 4.6  g/dL    Globulin 3.6 (H) 2.3 - 3.5 g/dL    A-G Ratio 1.1 (L) 1.2 - 3.5     BNP    Collection Time: 09/14/17  6:49 PM   Result Value Ref Range    BNP 513 pg/mL   MAGNESIUM    Collection Time: 09/14/17  6:49 PM   Result Value Ref Range    Magnesium 2.3 1.8 - 2.4 mg/dL     Xr Chest Port    Result Date: 09/14/2017  Chest portable CLINICAL INDICATION: Acute mild chest pain substernal and dyspnea, recent stroke; history of coronary artery disease and hypertension, cardiac catheterization and stent COMPARISON: 09/20/2010 TECHNIQUE: single AP portable view chest at 6:30 PM upright FINDINGS: Cardiac silhouette is enlarged. Otherwise mediastinal and hilar contours are normal. There is diffuse moderate pulmonary interstitial edema with no evidence of a consolidation, pneumothorax, or significant pleural effusion.     IMPRESSION: Cardiomegaly, pulmonary edema.     Discussed with cardiology.  Diuretic.  Increase metoprolol 50 mg twice a day.  Call office Monday for follow-up appointment.

## 2017-09-14 NOTE — ED Notes (Signed)
I have reviewed discharge instructions with the patient.  The patient verbalized understanding.    Patient left ED via Discharge Method: ambulatory to Home with wife.    Opportunity for questions and clarification provided.       Patient given 2 scripts.         To continue your aftercare when you leave the hospital, you may receive an automated call from our care team to check in on how you are doing.  This is a free service and part of our promise to provide the best care and service to meet your aftercare needs.??? If you have questions, or wish to unsubscribe from this service please call [phone].  Thank you for Choosing our [facility].

## 2017-09-14 NOTE — ED Provider Notes (Addendum)
62 year old gentleman with progressive history of shortness of breath orthopnea and dyspnea on exertion and some edema in his ankles for about 2 weeks.  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  He's had no fever.  No sputum.  No hemoptysis.  Denies any pain.  History of coronary artery disease with stenting in the past all of hypertension.  Atrial fibrillation was discovered with his stroke 2 weeks if    The history is provided by the patient.   Shortness of Breath   This is a new problem. The current episode started more than 1 week ago. The problem has been gradually worsening. Associated symptoms include cough. Pertinent negatives include no fever, no headaches, no neck pain, no sputum production, no hemoptysis, no wheezing, no chest pain, no vomiting, no abdominal pain, no rash and no leg pain. He has tried nothing for the symptoms. Associated medical issues include CAD and past MI. Associated medical issues do not include COPD, PE, heart failure or DVT.   Rapid Heart Rate   Associated symptoms include shortness of breath. Pertinent negatives include no chest pain, no abdominal pain and no headaches.        Past Medical History:   Diagnosis Date   ??? CAD (coronary artery disease)    ??? CVA (cerebral vascular accident) ([place]) 09/05/2017   ??? Dyslipidemia    ??? Hypertension    ??? Unstable angina ([place]) 02/02/2010       Past Surgical History:   Procedure Laterality Date   ??? CARDIAC SURG PROCEDURE UNLIST  11/2009    stent x1 LAD    ??? PR LEFT HEART CATH,PERCUTANEOUS  02/02/2010    stent x1         History reviewed. No pertinent family history.    Social History     Socioeconomic History   ??? Marital status: MARRIED     Spouse name: Not on file   ??? Number of children: Not on file   ??? Years of education: Not on file   ??? Highest education level: Not on file   Occupational History   ??? Not on file   Social Needs   ??? Financial resource strain: Not on file   ??? Food insecurity:      Worry: Not on file     Inability: Not on file   ??? Transportation needs:     Medical: Not on file     Non-medical: Not on file   Tobacco Use   ??? Smoking status: Never Smoker   ??? Smokeless tobacco: Never Used   Substance and Sexual Activity   ??? Alcohol use: No   ??? Drug use: Not on file   ??? Sexual activity: Not on file   Lifestyle   ??? Physical activity:     Days per week: Not on file     Minutes per session: Not on file   ??? Stress: Not on file   Relationships   ??? Social connections:     Talks on phone: Not on file     Gets together: Not on file     Attends religious service: Not on file     Active member of club or organization: Not on file     Attends meetings of clubs or organizations: Not on file     Relationship status: Not on file   ??? Intimate partner violence:     Fear of current or ex partner: Not on file  Emotionally abused: Not on file     Physically abused: Not on file     Forced sexual activity: Not on file   Other Topics Concern   ??? Not on file   Social History Narrative   ??? Not on file         ALLERGIES: Aspirin and Tylenol [acetaminophen]    Review of Systems   Constitutional: Negative for chills and fever.   Respiratory: Positive for cough and shortness of breath. Negative for hemoptysis, sputum production and wheezing.    Cardiovascular: Negative for chest pain and palpitations.   Gastrointestinal: Negative for abdominal pain, diarrhea and vomiting.   Genitourinary: Negative for dysuria and flank pain.   Musculoskeletal: Negative for back pain and neck pain.   Skin: Negative for color change and rash.   Neurological: Negative for syncope and headaches.   All other systems reviewed and are negative.      Vitals:    09/14/17 1822   BP: (!) 147/107   Pulse: (!) 114   Resp: 18   Temp: 98.2 ??F (36.8 ??C)   SpO2: 97%   Weight: 117.5 kg (259 lb)   Height: '6\' 4"'$  (1.93 m)            Physical Exam   Constitutional: He is oriented to person, place, and time. He appears  well-developed and well-nourished. No distress.   HENT:   Head: Normocephalic and atraumatic.   Right Ear: External ear normal.   Left Ear: External ear normal.   Mouth/Throat: Oropharynx is clear and moist. No oropharyngeal exudate.   Eyes: Pupils are equal, round, and reactive to light. Conjunctivae and EOM are normal.   Neck: Normal range of motion. Neck supple.   Cardiovascular: Normal rate, regular rhythm and intact distal pulses.   No murmur heard.  Pulmonary/Chest: No respiratory distress. He has rales.   Abdominal: Soft. Bowel sounds are normal. He exhibits no mass. There is no tenderness. There is no rebound and no guarding. No hernia.   Musculoskeletal:        Right lower leg: He exhibits edema (2+ pitting).        Left lower leg: He exhibits edema (2+ pitting).   Neurological: He is alert and oriented to person, place, and time. Gait normal.   Nl speech   Skin: Skin is warm and dry.   Psychiatric: He has a normal mood and affect. His speech is normal.   Nursing note and vitals reviewed.       MDM  Number of Diagnoses or Management Options  Diagnosis management comments: Assessment congestive heart failure, pneumonia, fluid overload, recurrent ischemia       Amount and/or Complexity of Data Reviewed  Clinical lab tests: ordered and reviewed  Tests in the radiology section of CPT??: ordered and reviewed  Tests in the medicine section of CPT??: ordered and reviewed  Review and summarize past medical records: yes (Remote anterior MI about 6 years ago.  Stenting.)  Independent visualization of images, tracings, or specimens: yes (EKG shows atrial fibrillation without ST T changes.  Slightly fast heart rate of 110.)    Risk of Complications, Morbidity, and/or Mortality  Presenting problems: moderate  Diagnostic procedures: low  Management options: moderate    Patient Progress  Patient progress: stable         Procedures    Results Include:    Recent Results (from the past 24 hour(s))   EKG, 12 LEAD, INITIAL     Collection  Time: 09/14/17  6:33 PM   Result Value Ref Range    Ventricular Rate 109 BPM    Atrial Rate 394 BPM    QRS Duration 100 ms    Q-T Interval 326 ms    QTC Calculation (Bezet) 439 ms    Calculated R Axis 32 degrees    Calculated T Axis 121 degrees    Diagnosis       !! AGE AND GENDER SPECIFIC ECG ANALYSIS !!  Atrial fibrillation with rapid ventricular response  Anteroseptal infarct (cited on or before 06-Dec-2009)  Abnormal ECG  When compared with ECG of 20-Sep-2010 08:13,  Atrial fibrillation has replaced Sinus rhythm  Vent. rate has increased BY  51 BPM  T wave amplitude has decreased in Inferior leads  Nonspecific T wave abnormality now evident in Lateral leads     POC TROPONIN-I    Collection Time: 09/14/17  6:47 PM   Result Value Ref Range    Troponin-I (POC) 0 (L) 0.02 - 0.05 ng/ml   CBC WITH AUTOMATED DIFF    Collection Time: 09/14/17  6:49 PM   Result Value Ref Range    WBC 9.6 4.3 - 11.1 K/uL    RBC 5.30 4.23 - 5.6 M/uL    HGB 14.6 13.6 - 17.2 g/dL    HCT 45.3 41.1 - 50.3 %    MCV 85.5 79.6 - 97.8 FL    MCH 27.5 26.1 - 32.9 PG    MCHC 32.2 31.4 - 35.0 g/dL    RDW 15.8 (H) 11.9 - 14.6 %    PLATELET 238 150 - 450 K/uL    MPV 12.0 9.4 - 12.3 FL    ABSOLUTE NRBC 0.00 0.0 - 0.2 K/uL    DF AUTOMATED      NEUTROPHILS 76 43 - 78 %    LYMPHOCYTES 14 13 - 44 %    MONOCYTES 8 4.0 - 12.0 %    EOSINOPHILS 2 0.5 - 7.8 %    BASOPHILS 0 0.0 - 2.0 %    IMMATURE GRANULOCYTES 0 0.0 - 5.0 %    ABS. NEUTROPHILS 7.3 1.7 - 8.2 K/UL    ABS. LYMPHOCYTES 1.4 0.5 - 4.6 K/UL    ABS. MONOCYTES 0.7 0.1 - 1.3 K/UL    ABS. EOSINOPHILS 0.2 0.0 - 0.8 K/UL    ABS. BASOPHILS 0.0 0.0 - 0.2 K/UL    ABS. IMM. GRANS. 0.0 0.0 - 0.5 K/UL   METABOLIC PANEL, COMPREHENSIVE    Collection Time: 09/14/17  6:49 PM   Result Value Ref Range    Sodium 138 136 - 145 mmol/L    Potassium 4.7 3.5 - 5.1 mmol/L    Chloride 103 98 - 107 mmol/L    CO2 22 21 - 32 mmol/L    Anion gap 13 7 - 16 mmol/L    Glucose 95 65 - 100 mg/dL    BUN 24 (H) 8 - 23 MG/DL     Creatinine 1.10 0.8 - 1.5 MG/DL    GFR est AA >60 >60 ml/min/1.73m    GFR est non-AA >60 >60 ml/min/1.79m   Calcium 8.9 8.3 - 10.4 MG/DL    Bilirubin, total 0.6 0.2 - 1.1 MG/DL    ALT (SGPT) 53 12 - 65 U/L    AST (SGOT) 41 (H) 15 - 37 U/L    Alk. phosphatase 111 50 - 136 U/L    Protein, total 7.6 6.3 - 8.2 g/dL    Albumin 4.0 3.2 - 4.6  g/dL    Globulin 3.6 (H) 2.3 - 3.5 g/dL    A-G Ratio 1.1 (L) 1.2 - 3.5     BNP    Collection Time: 09/14/17  6:49 PM   Result Value Ref Range    BNP 513 pg/mL   MAGNESIUM    Collection Time: 09/14/17  6:49 PM   Result Value Ref Range    Magnesium 2.3 1.8 - 2.4 mg/dL     Xr Chest Port    Result Date: 09/14/2017  Chest portable CLINICAL INDICATION: Acute mild chest pain substernal and dyspnea, recent stroke; history of coronary artery disease and hypertension, cardiac catheterization and stent COMPARISON: 09/20/2010 TECHNIQUE: single AP portable view chest at 6:30 PM upright FINDINGS: Cardiac silhouette is enlarged. Otherwise mediastinal and hilar contours are normal. There is diffuse moderate pulmonary interstitial edema with no evidence of a consolidation, pneumothorax, or significant pleural effusion.     IMPRESSION: Cardiomegaly, pulmonary edema.     Discussed with cardiology.  Diuretic.  Increase metoprolol 50 mg twice a day.  Call office Monday for follow-up appointment.

## 2017-09-14 NOTE — ED Triage Notes (Signed)
Pt to ED with wife c/o increased shortness of breath. Pt had stroke and received TPA in [place] approximately 2 weeks ago. Pt has developed A-fib and currently takes eliquis and metoprolol for the a-fib. Pt followed up with Family practice this past week. Has not seen cardiology. No LE edema noted.

## 2017-09-15 LAB — EKG, 12 LEAD, INITIAL
Atrial Rate: 394 {beats}/min
Calculated R Axis: 32 degrees
Calculated T Axis: 121 degrees
Q-T Interval: 326 ms
QRS Duration: 100 ms
QTC Calculation (Bezet): 439 ms
Ventricular Rate: 109 {beats}/min

## 2017-09-15 LAB — EKG 12-LEAD
Atrial Rate: 394 {beats}/min
Q-T Interval: 326 ms
QRS Duration: 100 ms
QTc Calculation (Bazett): 439 ms
R Axis: 32 degrees
T Axis: 121 degrees
Ventricular Rate: 109 {beats}/min

## 2017-09-15 MED ORDER — FUROSEMIDE 20 MG TAB
20 mg | ORAL_TABLET | Freq: Every day | ORAL | 0 refills | Status: AC
Start: 2017-09-15 — End: 2017-09-21

## 2017-09-15 MED ORDER — METOPROLOL TARTRATE 5 MG/5 ML IV SOLN
5 mg/ mL | Freq: Once | INTRAVENOUS | Status: AC
Start: 2017-09-15 — End: 2017-09-14
  Administered 2017-09-15: via INTRAVENOUS

## 2017-09-15 MED ORDER — ZOLPIDEM 5 MG TAB
5 mg | ORAL_TABLET | Freq: Every evening | ORAL | 0 refills | Status: DC | PRN
Start: 2017-09-15 — End: 2017-10-23

## 2017-09-15 MED FILL — METOPROLOL TARTRATE 5 MG/5 ML IV SOLN: 5 mg/ mL | INTRAVENOUS | Qty: 5

## 2017-10-08 ENCOUNTER — Ambulatory Visit: Attending: Cardiovascular Disease | Primary: Adult Health

## 2017-10-08 ENCOUNTER — Ambulatory Visit
Admit: 2017-10-08 | Discharge: 2017-10-08 | Payer: Charity | Attending: Cardiovascular Disease | Primary: Family Medicine

## 2017-10-08 DIAGNOSIS — I4819 Other persistent atrial fibrillation: Secondary | ICD-10-CM

## 2017-10-08 MED ORDER — RIVAROXABAN 20 MG TAB
20 mg | ORAL_TABLET | Freq: Every day | ORAL | 11 refills | Status: DC
Start: 2017-10-08 — End: 2017-11-05

## 2017-10-08 NOTE — Progress Notes (Signed)
Progress  Notes by Fenton Foy, MD at 10/08/17 1115                Author: Fenton Foy, MD  Service: --  Author Type: Physician       Filed: 10/08/17 1252  Encounter Date: 10/08/2017  Status: Signed          Editor: Fenton Foy, MD (Physician)                         [place]   [address], [address]   [place] [address]   PHONE: [phone]      NAME: [name] [name]   DOB: May 11, 1955       HPI   [name] [name] is a 62 y.o.  male seen for a follow up visit regarding    Hospital Follow Up (ER f/u 09-14-17 )     He is here in f/u on his CVA around [facility] Day which was treated with thrombolytics . He presented with right hemiplegia.  He has had a great response to the treatment.  He complains of having fatigue  since the stroke . He is also has been diagnosed with afib.    He is having trouble with sleep since the stroke. He is having trouble sleeping with abrupt awakening as he starts into REM sleep .    No chest pain or dyspnea.     He has fatigue during the day.   Treatment has been difficult overall as he has no health insurance though    he has gone to [facility] .       He has allergies and difficulty with standard medical care historically.  He has taken it upon himself to reduce Eliquis to 2.5 mg a day with Tumeric because he developed thigh pain on '5mg'$  bid dose.           Past Medical History, Past Surgical History, Family history, Social History, and Medications were all reviewed with the patient today and updated as necessary.         Outpatient Medications Marked as Taking for the 10/08/17 encounter (Office Visit) with Fenton Foy, MD          Medication  Sig  Dispense  Refill           ?  loratadine (CLARITIN) 10 mg tablet  Take 10 mg by mouth.         ?  clotrimazole (LOTRIMIN) 1 % topical cream  Apply  to affected area two (2) times a day.         ?  TRIAMCINOLONE ACETONIDE, BULK,  by Does Not Apply route.         ?  COLLAGEN  by Does Not Apply route.         ?  TURMERIC PO   Take  by mouth.         ?  ascorbic acid (VITAMIN C PO)  Take  by mouth.         ?  multivit-min/FA/lycopen/lutein (CENTRUM SILVER MEN PO)  Take  by mouth.         ?  rivaroxaban (XARELTO) 20 mg tab tablet  Take 1 Tab by mouth daily (with lunch). Indications: Treatment to Prevent Blood Clots in Chronic Atrial Fibrillation  30 Tab  11     ?  metoprolol tartrate (LOPRESSOR) 25 mg tablet  Take 50 mg by mouth  two (2) times a day.         ?  lisinopril (PRINIVIL, ZESTRIL) 20 mg tablet  Take 20 mg by mouth daily.               ?  ezetimibe (ZETIA) 10 mg tablet  Take 10 mg by mouth daily.              Patient Active Problem List          Diagnosis        ?  Anterior myocardial infarction [facility])     ?  Essential hypertension, benign     ?  Coronary atherosclerosis of native coronary artery     ?  Dyslipidemia     ?  Chest pain, unspecified     ?  Unstable angina (HCC)     ?  Hypertension        ?  CAD (coronary artery disease)          Allergies        Allergen  Reactions         ?  Aspirin  Hives         ?  Tylenol [Acetaminophen]  Hives          Past Medical History:        Diagnosis  Date         ?  CAD (coronary artery disease)       ?  CVA (cerebral vascular accident) ([place])  09/05/2017     ?  Dyslipidemia       ?  Hypertension           ?  Unstable angina ([place])  02/02/2010          Past Surgical History:         Procedure  Laterality  Date          ?  CARDIAC SURG PROCEDURE UNLIST    11/2009          stent x1 LAD           ?  PR LEFT HEART CATH,PERCUTANEOUS    02/02/2010          stent x1        No family history on file.      Social History          Tobacco Use         ?  Smoking status:  Never Smoker     ?  Smokeless tobacco:  Never Used       Substance Use Topics         ?  Alcohol use:  No                 Review of Systems    Constitutional: Positive for malaise/fatigue. Negative for weight loss.    HENT: Negative for nosebleeds.     Respiratory: Positive for shortness of breath.     Cardiovascular: Negative for  chest pain.    Gastrointestinal: Positive for constipation.    Genitourinary: Negative for hematuria.    Neurological: Negative for loss of consciousness.    Psychiatric/Behavioral: The patient is nervous/anxious (falling asleep ) .                        Wt Readings from Last 3 Encounters:        10/08/17  252 lb 9.6 oz (114.6 kg)     09/14/17  259 lb (117.5 kg)        07/12/15  250 lb (113.4 kg)          BP Readings from Last 3 Encounters:        10/08/17  118/78     09/14/17  (!) 148/95        07/12/15  169/90           Visit Vitals      BP  118/78     Pulse  80     Ht  _0  (1.93 m)         Wt  252 lb 9.6 oz (114.6 kg)  Comment: with shoes        BMI  30.75 kg/m??              Physical Exam    Constitutional: He appears well-developed and well-nourished.    HENT:    Head: Normocephalic.    Neck: No JVD present. Carotid bruit is not present.    Cardiovascular: Normal rate and normal heart sounds. An irregularly irregular rhythm present.    Pulmonary/Chest: Effort normal and breath sounds normal.    Abdominal: Soft.    Musculoskeletal: He exhibits edema (mild ankle edema ) .   Neurological: He is alert.    Skin: Skin is warm and dry.   Psychiatric: He has a normal mood and affect.          Medical problems and test results were reviewed with the patient today.       No results found for any visits on 10/08/17.         No results found for: HBA1C, HGBE8, HBA1CPOC, HBA1CEXT, HBA1CEXT        Lab Results         Component  Value  Date/Time            WBC  9.6  09/14/2017 06:49 PM       HGB  14.6  09/14/2017 06:49 PM       HCT  45.3  09/14/2017 06:49 PM       PLATELET  238  09/14/2017 06:49 PM            MCV  85.5  09/14/2017 06:49 PM             Lab Results         Component  Value  Date/Time            Sodium  138  09/14/2017 06:49 PM       Potassium  4.7  09/14/2017 06:49 PM       Chloride  103  09/14/2017 06:49 PM       CO2  22  09/14/2017 06:49 PM       Anion gap  13  09/14/2017 06:49 PM       Glucose  95   09/14/2017 06:49 PM       BUN  24 (H)  09/14/2017 06:49 PM       Creatinine  1.10  09/14/2017 06:49 PM       GFR est AA  >60  09/14/2017 06:49 PM       GFR est non-AA  >60  09/14/2017 06:49 PM            Calcium  8.9  09/14/2017 06:49 PM             Lab Results         Component  Value  Date/Time            ALT (SGPT)  53  09/14/2017 06:49 PM       AST (SGOT)  41 (H)  09/14/2017 06:49 PM       Alk. phosphatase  111  09/14/2017 06:49 PM            Bilirubin, total  0.6  09/14/2017 06:49 PM             Lab Results         Component  Value  Date/Time            Cholesterol, total  104  01/27/2010 03:51 PM       HDL Cholesterol  23 (L)  01/27/2010 03:51 PM       LDL,Direct  56  01/27/2010 03:51 PM       LDL, calculated  37.4  01/27/2010 03:51 PM       VLDL, calculated  43.6 (H)  01/27/2010 03:51 PM       Triglyceride  218 (H)  01/27/2010 03:51 PM            CHOL/HDL Ratio  4.5  01/27/2010 03:51 PM              ASSESSMENT and PLAN      Diagnoses and all orders for this visit:      1. Persistent atrial fibrillation (HCC)   Comments:   it sounds like he left Adelfa Koh in AFib with rate control  I have tried to impress upon him his extreme stroke risk and importance with compliance of medically accepted anticoagulation       2. Anterior myocardial infarction [facility])   Comments:   remote stenting  and  history of apical wall motion abnormality no current angina       3. Primary central sleep apnea   Comments:   sounds like it could be by symptoms    will start with overnight oximeter   Orders:   -     REFERRAL TO SLEEP STUDIES       Thank you very much for your kind referral of this pleasant patient .  Please do not hesitate to call me  [phone] with any  questions or suggestions.                            Follow-up and Dispositions      ??  Return in about 2 weeks (around 10/22/2017).                         Fenton Foy, MD   10/08/2017   11:53 AM

## 2017-10-08 NOTE — Progress Notes (Signed)
[facility]  [address], [address]  [place] [address]  PHONE: [phone]    NAME: [name] [name]  DOB: 02-01-1956   HPI  [name] [name] is a 62 y.o. male seen for a follow up visit regarding   Hospital Follow Up (ER f/u 09-14-17 )    He is here in f/u on his CVA around [facility] Day which was treated with thrombolytics . He presented with right hemiplegia.  He has had a great response to the treatment.  He complains of having fatigue since the stroke . He is also has been diagnosed with afib.   He is having trouble with sleep since the stroke. He is having trouble sleeping with abrupt awakening as he starts into REM sleep .    No chest pain or dyspnea.    He has fatigue during the day.   Treatment has been difficult overall as he has no health insurance though   he has gone to [facility] .       He has allergies and difficulty with standard medical care historically.  He has taken it upon himself to reduce Eliquis to 2.5 mg a day with Tumeric because he developed thigh pain on 72m bid dose.        Past Medical History, Past Surgical History, Family history, Social History, and Medications were all reviewed with the patient today and updated as necessary.     Outpatient Medications Marked as Taking for the 10/08/17 encounter (Office Visit) with CFenton Foy MD   Medication Sig Dispense Refill   ??? loratadine (CLARITIN) 10 mg tablet Take 10 mg by mouth.     ??? clotrimazole (LOTRIMIN) 1 % topical cream Apply  to affected area two (2) times a day.     ??? TRIAMCINOLONE ACETONIDE, BULK, by Does Not Apply route.     ??? COLLAGEN by Does Not Apply route.     ??? TURMERIC PO Take  by mouth.     ??? ascorbic acid (VITAMIN C PO) Take  by mouth.     ??? multivit-min/FA/lycopen/lutein (CENTRUM SILVER MEN PO) Take  by mouth.     ??? rivaroxaban (XARELTO) 20 mg tab tablet Take 1 Tab by mouth daily (with lunch). Indications: Treatment to Prevent Blood Clots in Chronic Atrial Fibrillation 30 Tab 11    ??? metoprolol tartrate (LOPRESSOR) 25 mg tablet Take 50 mg by mouth two (2) times a day.     ??? lisinopril (PRINIVIL, ZESTRIL) 20 mg tablet Take 20 mg by mouth daily.     ??? ezetimibe (ZETIA) 10 mg tablet Take 10 mg by mouth daily.       Patient Active Problem List    Diagnosis   ??? Anterior myocardial infarction ([facility]   ??? Essential hypertension, benign   ??? Coronary atherosclerosis of native coronary artery   ??? Dyslipidemia   ??? Chest pain, unspecified   ??? Unstable angina (HCC)   ??? Hypertension   ??? CAD (coronary artery disease)     Allergies   Allergen Reactions   ??? Aspirin Hives   ??? Tylenol [Acetaminophen] Hives     Past Medical History:   Diagnosis Date   ??? CAD (coronary artery disease)    ??? CV[place]re[place] 09/05/2017   ??? Dyslipidemia    ??? Hypertension    ??? Unstable angina ([place] 02/02/2010     Past Surgical History:   Procedure Laterality Date   ??? CARDIAC SURG PROCEDURE UNLIST  11/2009    stent x1 LAD    ??? PR LEFT HEART CATH,PERCUTANEOUS  02/02/2010    stent x1     No family history on file.   Social History     Tobacco Use   ??? Smoking status: Never Smoker   ??? Smokeless tobacco: Never Used   Substance Use Topics   ??? Alcohol use: No           Review of Systems   Constitutional: Positive for malaise/fatigue. Negative for weight loss.   HENT: Negative for nosebleeds.    Respiratory: Positive for shortness of breath.    Cardiovascular: Negative for chest pain.   Gastrointestinal: Positive for constipation.   Genitourinary: Negative for hematuria.   Neurological: Negative for loss of consciousness.   Psychiatric/Behavioral: The patient is nervous/anxious (falling asleep ).              Wt Readings from Last 3 Encounters:   10/08/17 252 lb 9.6 oz (114.6 kg)   09/14/17 259 lb (117.5 kg)   07/12/15 250 lb (113.4 kg)     BP Readings from Last 3 Encounters:   10/08/17 118/78   09/14/17 (!) 148/95   07/12/15 169/90       Visit Vitals  BP 118/78   Pulse 80   Ht 6' 4" (1.93 m)    Wt 252 lb 9.6 oz (114.6 kg) Comment: with shoes   BMI 30.75 kg/m??         Physical Exam   Constitutional: He appears well-developed and well-nourished.   HENT:   Head: Normocephalic.   Neck: No JVD present. Carotid bruit is not present.   Cardiovascular: Normal rate and normal heart sounds. An irregularly irregular rhythm present.   Pulmonary/Chest: Effort normal and breath sounds normal.   Abdominal: Soft.   Musculoskeletal: He exhibits edema (mild ankle edema ).   Neurological: He is alert.   Skin: Skin is warm and dry.   Psychiatric: He has a normal mood and affect.       Medical problems and test results were reviewed with the patient today.     No results found for any visits on 10/08/17.      No results found for: HBA1C, HGBE8, HBA1CPOC, HBA1CEXT, HBA1CEXT    Lab Results   Component Value Date/Time    WBC 9.6 09/14/2017 06:49 PM    HGB 14.6 09/14/2017 06:49 PM    HCT 45.3 09/14/2017 06:49 PM    PLATELET 238 09/14/2017 06:49 PM    MCV 85.5 09/14/2017 06:49 PM       Lab Results   Component Value Date/Time    Sodium 138 09/14/2017 06:49 PM    Potassium 4.7 09/14/2017 06:49 PM    Chloride 103 09/14/2017 06:49 PM    CO2 22 09/14/2017 06:49 PM    Anion gap 13 09/14/2017 06:49 PM    Glucose 95 09/14/2017 06:49 PM    BUN 24 (H) 09/14/2017 06:49 PM    Creatinine 1.10 09/14/2017 06:49 PM    GFR est AA >60 09/14/2017 06:49 PM    GFR est non-AA >60 09/14/2017 06:49 PM    Calcium 8.9 09/14/2017 06:49 PM       Lab Results   Component Value Date/Time    ALT (SGPT) 53 09/14/2017 06:49 PM    AST (SGOT) 41 (H) 09/14/2017 06:49 PM    Alk. phosphatase 111 09/14/2017 06:49 PM    Bilirubin, total 0.6 09/14/2017 06:49 PM  Lab Results   Component Value Date/Time    Cholesterol, total 104 01/27/2010 03:51 PM    HDL Cholesterol 23 (L) 01/27/2010 03:51 PM    LDL,Direct 56 01/27/2010 03:51 PM    LDL, calculated 37.4 01/27/2010 03:51 PM    VLDL, calculated 43.6 (H) 01/27/2010 03:51 PM    Triglyceride 218 (H) 01/27/2010 03:51 PM     CHOL/HDL Ratio 4.5 01/27/2010 03:51 PM         ASSESSMENT and PLAN    Diagnoses and all orders for this visit:    1. Persistent atrial fibrillation (HCC)  Comments:  it sounds like he left Adelfa Koh in AFib with rate control  I have tried to impress upon him his extreme stroke risk and importance with compliance of medically accepted anticoagulation     2. Anterior myocardial infarction [facility])  Comments:  remote stenting  and  history of apical wall motion abnormality no current angina     3. Primary central sleep apnea  Comments:  sounds like it could be by symptoms    will start with overnight oximeter  Orders:  -     REFERRAL TO SLEEP STUDIES     Thank you very much for your kind referral of this pleasant patient .  Please do not hesitate to call me  [phone] with any questions or suggestions.              Follow-up and Dispositions    ?? Return in about 2 weeks (around 10/22/2017).             Fenton Foy, MD6/24/2019  11:53 AM

## 2017-10-23 ENCOUNTER — Ambulatory Visit: Attending: Cardiovascular Disease | Primary: Adult Health

## 2017-10-23 ENCOUNTER — Ambulatory Visit
Admit: 2017-10-23 | Discharge: 2017-10-23 | Payer: Charity | Attending: Cardiovascular Disease | Primary: Family Medicine

## 2017-10-23 DIAGNOSIS — I4819 Other persistent atrial fibrillation: Secondary | ICD-10-CM

## 2017-10-23 MED ORDER — FUROSEMIDE 20 MG TAB
20 mg | ORAL_TABLET | Freq: Every day | ORAL | 11 refills | Status: DC
Start: 2017-10-23 — End: 2017-11-07

## 2017-10-23 MED ORDER — METOPROLOL SUCCINATE SR 100 MG 24 HR TAB
100 mg | ORAL_TABLET | Freq: Every day | ORAL | 11 refills | Status: DC
Start: 2017-10-23 — End: 2017-11-07

## 2017-10-23 NOTE — Progress Notes (Signed)
Progress  Notes by Fenton Foy, MD at 10/23/17 1430                Author: Fenton Foy, MD  Service: --  Author Type: Physician       Filed: 10/23/17 1521  Encounter Date: 10/23/2017  Status: Signed          Editor: Fenton Foy, MD (Physician)                         [place]   [address], [address]   [place] [address]   PHONE: [phone]      NAME: [name] [name]   DOB: 1955-08-04       HPI   [name] [name] is a 62 y.o.  male seen for a follow up visit regarding    Results (2 wk sleep study) and Irregular Heart Beat     He is here in f/u on his afib and history of stroke.  He is having pedal edema.  He is with his wife today regarding his diet is low sodium .  He notes that he has a chronic fullness and early satiety.             He feels  like sleep is still an issue and does not eat late.  No chest pains or dyspnea          Past Medical History, Past Surgical History, Family history, Social History, and Medications were all reviewed with the patient today and updated as necessary.            Patient Active Problem List          Diagnosis        ?  Anterior myocardial infarction [facility])     ?  Essential hypertension, benign     ?  Coronary atherosclerosis of native coronary artery     ?  Dyslipidemia     ?  Chest pain, unspecified     ?  Unstable angina (HCC)     ?  Hypertension        ?  CAD (coronary artery disease)          Allergies        Allergen  Reactions         ?  Aspirin  Hives         ?  Tylenol [Acetaminophen]  Hives          Past Medical History:        Diagnosis  Date         ?  CAD (coronary artery disease)       ?  CVA (cerebral vascular accident) ([place])  09/05/2017     ?  Dyslipidemia       ?  Hypertension           ?  Unstable angina ([place])  02/02/2010          Past Surgical History:         Procedure  Laterality  Date          ?  CARDIAC SURG PROCEDURE UNLIST    11/2009          stent x1 LAD           ?  PR LEFT HEART CATH,PERCUTANEOUS    02/02/2010          stent x1  No family history on file.      Social History          Tobacco Use         ?  Smoking status:  Never Smoker     ?  Smokeless tobacco:  Never Used       Substance Use Topics         ?  Alcohol use:  No                 ROS                   Wt Readings from Last 3 Encounters:        10/08/17  252 lb 9.6 oz (114.6 kg)     09/14/17  259 lb (117.5 kg)        07/12/15  250 lb (113.4 kg)          BP Readings from Last 3 Encounters:        10/23/17  (!) 150/98     10/08/17  118/78        09/14/17  (!) 148/95           Visit Vitals      BP  (!) 150/98     Pulse  86     Ht  '6\' 4"'  (1.93 m)        BMI  30.75 kg/m??              Physical Exam    Constitutional: He appears well-developed and well-nourished.    HENT:    [name]: Normocephalic.    Neck: No JVD present. Carotid bruit is not present.    Cardiovascular: Normal rate and normal heart sounds. An irregularly irregular rhythm present.    Pulmonary/Chest: Effort normal and breath sounds normal.    Abdominal: Soft.    Musculoskeletal: He exhibits edema (2+) .   Neurological: He is alert.    Skin: Skin is warm and dry.   Psychiatric: He has a normal mood and affect.          Medical problems and test results were reviewed with the patient today.       No results found for any visits on 10/23/17.         No results found for: HBA1C, HGBE8, HBA1CPOC, HBA1CEXT, HBA1CEXT        Lab Results         Component  Value  Date/Time            WBC  9.6  09/14/2017 06:49 PM       HGB  14.6  09/14/2017 06:49 PM       HCT  45.3  09/14/2017 06:49 PM       PLATELET  238  09/14/2017 06:49 PM            MCV  85.5  09/14/2017 06:49 PM             Lab Results         Component  Value  Date/Time            Sodium  138  09/14/2017 06:49 PM       Potassium  4.7  09/14/2017 06:49 PM       Chloride  103  09/14/2017 06:49 PM       CO2  22  09/14/2017 06:49 PM       Anion gap  13  09/14/2017 06:49 PM  Glucose  95  09/14/2017 06:49 PM       BUN  24 (H)  09/14/2017 06:49 PM       Creatinine  1.10   09/14/2017 06:49 PM       GFR est AA  >60  09/14/2017 06:49 PM       GFR est non-AA  >60  09/14/2017 06:49 PM            Calcium  8.9  09/14/2017 06:49 PM             Lab Results         Component  Value  Date/Time            ALT (SGPT)  53  09/14/2017 06:49 PM       AST (SGOT)  41 (H)  09/14/2017 06:49 PM       Alk. phosphatase  111  09/14/2017 06:49 PM            Bilirubin, total  0.6  09/14/2017 06:49 PM             Lab Results         Component  Value  Date/Time            Cholesterol, total  104  01/27/2010 03:51 PM       HDL Cholesterol  23 (L)  01/27/2010 03:51 PM       LDL,Direct  56  01/27/2010 03:51 PM       LDL, calculated  37.4  01/27/2010 03:51 PM       VLDL, calculated  43.6 (H)  01/27/2010 03:51 PM       Triglyceride  218 (H)  01/27/2010 03:51 PM            CHOL/HDL Ratio  4.5  01/27/2010 03:51 PM              ASSESSMENT and PLAN      Diagnoses and all orders for this visit:      1. Persistent atrial fibrillation (HCC)   -     metoprolol succinate (TOPROL-XL) 100 mg tablet; Take 1 Tab by mouth daily. Indications: Ventricular Rate Control in Atrial Fibrillation   -     ECHO COMPLETE STUDY; Future   -     ECHO COMPLETE STUDY; Future      2. Localized edema   -     furosemide (LASIX) 20 mg tablet; Take 1 Tab by mouth daily.   -     ECHO COMPLETE STUDY; Future      3. Diastolic CHF, chronic (HCC)   -     ECHO COMPLETE STUDY; Future         He likely has some component of CHF   afib rate seems a little fast    will go up on Toprol .      He will continue anticoagulation we will consider cardioversion for his persistent  afib since stroke                         Follow-up and Dispositions      ??  Return in about 1 month (around 11/20/2017).                         Fenton Foy, MD   10/23/2017   2:41 PM

## 2017-10-23 NOTE — Progress Notes (Signed)
[facility]  [address], [address]  [place] [address]  PHONE: [phone]    NAME: ZERIC BARANOWSKI  DOB: 1956/01/17   HPI  [name] [name] is a 62 y.o. male seen for a follow up visit regarding   Results (2 wk sleep study) and Irregular Heart Beat    He is here in f/u on his afib and history of stroke.  He is having pedal edema.  He is with his wife today regarding his diet is low sodium .  He notes that he has a chronic fullness and early satiety.            He feels  like sleep is still an issue and does not eat late.  No chest pains or dyspnea       Past Medical History, Past Surgical History, Family history, Social History, and Medications were all reviewed with the patient today and updated as necessary.       Patient Active Problem List    Diagnosis   ??? Anterior myocardial infarction [facility])   ??? Essential hypertension, benign   ??? Coronary atherosclerosis of native coronary artery   ??? Dyslipidemia   ??? Chest pain, unspecified   ??? Unstable angina (HCC)   ??? Hypertension   ??? CAD (coronary artery disease)     Allergies   Allergen Reactions   ??? Aspirin Hives   ??? Tylenol [Acetaminophen] Hives     Past Medical History:   Diagnosis Date   ??? CAD (coronary artery disease)    ??? CVA (cerebral vascular accident) ([place]) 09/05/2017   ??? Dyslipidemia    ??? Hypertension    ??? Unstable angina ([place]) 02/02/2010     Past Surgical History:   Procedure Laterality Date   ??? CARDIAC SURG PROCEDURE UNLIST  11/2009    stent x1 LAD    ??? PR LEFT HEART CATH,PERCUTANEOUS  02/02/2010    stent x1     No family history on file.   Social History     Tobacco Use   ??? Smoking status: Never Smoker   ??? Smokeless tobacco: Never Used   Substance Use Topics   ??? Alcohol use: No           ROS          Wt Readings from Last 3 Encounters:   10/08/17 252 lb 9.6 oz (114.6 kg)   09/14/17 259 lb (117.5 kg)   07/12/15 250 lb (113.4 kg)     BP Readings from Last 3 Encounters:   10/23/17 (!) 150/98   10/08/17 118/78   09/14/17 (!) 148/95        Visit Vitals  BP (!) 150/98   Pulse 86   Ht 6' 4" (1.93 m)   BMI 30.75 kg/m??         Physical Exam   Constitutional: He appears well-developed and well-nourished.   HENT:   Head: Normocephalic.   Neck: No JVD present. Carotid bruit is not present.   Cardiovascular: Normal rate and normal heart sounds. An irregularly irregular rhythm present.   Pulmonary/Chest: Effort normal and breath sounds normal.   Abdominal: Soft.   Musculoskeletal: He exhibits edema (2+).   Neurological: He is alert.   Skin: Skin is warm and dry.   Psychiatric: He has a normal mood and affect.       Medical problems and test results were reviewed with the patient today.     No results found for any visits on  10/23/17.      No results found for: HBA1C, HGBE8, HBA1CPOC, HBA1CEXT, HBA1CEXT    Lab Results   Component Value Date/Time    WBC 9.6 09/14/2017 06:49 PM    HGB 14.6 09/14/2017 06:49 PM    HCT 45.3 09/14/2017 06:49 PM    PLATELET 238 09/14/2017 06:49 PM    MCV 85.5 09/14/2017 06:49 PM       Lab Results   Component Value Date/Time    Sodium 138 09/14/2017 06:49 PM    Potassium 4.7 09/14/2017 06:49 PM    Chloride 103 09/14/2017 06:49 PM    CO2 22 09/14/2017 06:49 PM    Anion gap 13 09/14/2017 06:49 PM    Glucose 95 09/14/2017 06:49 PM    BUN 24 (H) 09/14/2017 06:49 PM    Creatinine 1.10 09/14/2017 06:49 PM    GFR est AA >60 09/14/2017 06:49 PM    GFR est non-AA >60 09/14/2017 06:49 PM    Calcium 8.9 09/14/2017 06:49 PM       Lab Results   Component Value Date/Time    ALT (SGPT) 53 09/14/2017 06:49 PM    AST (SGOT) 41 (H) 09/14/2017 06:49 PM    Alk. phosphatase 111 09/14/2017 06:49 PM    Bilirubin, total 0.6 09/14/2017 06:49 PM       Lab Results   Component Value Date/Time    Cholesterol, total 104 01/27/2010 03:51 PM    HDL Cholesterol 23 (L) 01/27/2010 03:51 PM    LDL,Direct 56 01/27/2010 03:51 PM    LDL, calculated 37.4 01/27/2010 03:51 PM    VLDL, calculated 43.6 (H) 01/27/2010 03:51 PM    Triglyceride 218 (H) 01/27/2010 03:51 PM     CHOL/HDL Ratio 4.5 01/27/2010 03:51 PM         ASSESSMENT and PLAN    Diagnoses and all orders for this visit:    1. Persistent atrial fibrillation (HCC)  -     metoprolol succinate (TOPROL-XL) 100 mg tablet; Take 1 Tab by mouth daily. Indications: Ventricular Rate Control in Atrial Fibrillation  -     ECHO COMPLETE STUDY; Future  -     ECHO COMPLETE STUDY; Future    2. Localized edema  -     furosemide (LASIX) 20 mg tablet; Take 1 Tab by mouth daily.  -     ECHO COMPLETE STUDY; Future    3. Diastolic CHF, chronic (HCC)  -     ECHO COMPLETE STUDY; Future      He likely has some component of CHF   afib rate seems a little fast    will go up on Toprol .      He will continue anticoagulation we will consider cardioversion for his persistent afib since stroke              Follow-up and Dispositions    ?? Return in about 1 month (around 11/20/2017).             Fenton Foy, MD7/12/2017  2:41 PM

## 2017-11-01 ENCOUNTER — Telehealth

## 2017-11-01 NOTE — Telephone Encounter (Signed)
Pt informed of Dr Clearnce Hastenebe's response and is agreeable to POC. Advised that triage would place orders and [facility]. Pt to expect call from scheduling to set up. Pt v/u and thanks.    [facility]    Please schedule TEE/Cardioversion w/ Dr Daleen Snookebe Monday 7/22 or Wednesday 7/24.

## 2017-11-01 NOTE — Telephone Encounter (Signed)
schedule TEE CV for me either Monday or Wednesday next week

## 2017-11-01 NOTE — Telephone Encounter (Signed)
Pt reports afib has been "all day every day".  He is SOB, having difficulty sleeping, and states it is difficult to function. HR is staying <100.  He is asking if cardioversion can be done soon and can echo and cardioversion be done at the same time. Please advise.  Pt currently scheduled for echo 7/29 and f/u 8/9.    Metoprolol succinate 100mg  daily  Lasix 20mg  daily  Xarelto 20mg  daily  Lisinopril 20mg  daily

## 2017-11-01 NOTE — Telephone Encounter (Signed)
States Dr Daleen Snookebe was going to a procedure on him for his A-fib. He is having more frequent episodes and would like to be seen sooner.

## 2017-11-02 NOTE — Progress Notes (Signed)
Called to pre-assess for TEE/Cardioversion with Dr Katrinka BlazingSmith , Scheduled 11/05/17. No answer & message left.

## 2017-11-02 NOTE — Progress Notes (Signed)
Called to pre-assess for TEE/Cardioversion with Dr Smith , Scheduled 11/05/17. No answer & message left.

## 2017-11-04 NOTE — Progress Notes (Signed)
Patient pre-assessment complete for TEE/Cardioversion with DR Cebe   scheduled for 11/05/17 at 8am, arrival time 7am. Patient verified using DOB. Patient instructed to bring all home medications in labeled bottles on the day of procedure. NPO status reinforced.  Patient instructed to HOLD lasix in am. Instructed they can take all other medications excluding vitamins & supplements. Patient verbalizes understanding of all instructions & denies any questions at this time.

## 2017-11-04 NOTE — Progress Notes (Signed)
Patient pre-assessment complete for TEE/Cardioversion with DR Cebe   scheduled for 11/05/17 at 8am, arrival time 7am. Patient verified using DOB. Patient instructed to bring all home medications in labeled bottles on the day of procedure. NPO status reinforced.  Patient instructed to HOLD lasix in am. Instructed they can take all other medications excluding vitamins & supplements. Patient verbalizes understanding of all instructions & denies any questions at this time.

## 2017-11-05 ENCOUNTER — Inpatient Hospital Stay
Admit: 2017-11-05 | Discharge: 2017-11-07 | Disposition: A | Discharge status: Home / Self Care | Payer: Charity | Attending: Cardiovascular Disease | Admitting: Cardiovascular Disease | Primary: Family Medicine

## 2017-11-05 VITALS — BP 134/95 | HR 92 | Temp 97.80000°F | Resp 17 | Ht 76.0 in | Wt 235.9 lb

## 2017-11-05 DIAGNOSIS — I481 Persistent atrial fibrillation: Principal | ICD-10-CM

## 2017-11-05 LAB — CBC W/O DIFF
ABSOLUTE NRBC: 0 10*3/uL (ref 0.0–0.2)
HCT: 42.7 % (ref 41.1–50.3)
HGB: 13.6 g/dL (ref 13.6–17.2)
MCH: 28.5 PG (ref 26.1–32.9)
MCHC: 31.9 g/dL (ref 31.4–35.0)
MCV: 89.3 FL (ref 79.6–97.8)
MPV: 11.6 FL (ref 9.4–12.3)
PLATELET: 181 10*3/uL (ref 150–450)
RBC: 4.78 M/uL (ref 4.23–5.6)
RDW: 16.2 % — ABNORMAL HIGH (ref 11.9–14.6)
WBC: 5.1 10*3/uL (ref 4.3–11.1)

## 2017-11-05 LAB — MAGNESIUM
Magnesium: 2.3 mg/dL (ref 1.8–2.4)
Magnesium: 2.3 mg/dL (ref 1.8–2.4)

## 2017-11-05 LAB — PROTHROMBIN TIME + INR
INR: 1.1
INR: 1.1
Prothrombin time: 13.8 s (ref 11.7–14.5)
Prothrombin time: 14.5 s (ref 11.7–14.5)

## 2017-11-05 LAB — METABOLIC PANEL, BASIC
Anion gap: 6 mmol/L — ABNORMAL LOW (ref 7–16)
BUN: 16 MG/DL (ref 8–23)
CO2: 31 mmol/L (ref 21–32)
Calcium: 8.9 MG/DL (ref 8.3–10.4)
Chloride: 105 mmol/L (ref 98–107)
Creatinine: 1.22 MG/DL (ref 0.8–1.5)
GFR est AA: >60 mL/min/{1.73_m2} (ref >60)
GFR est non-AA: >60 mL/min/{1.73_m2} (ref >60)
Glucose: 99 mg/dL (ref 65–100)
Potassium: 4 mmol/L (ref 3.5–5.1)
Sodium: 142 mmol/L (ref 136–145)

## 2017-11-05 LAB — EKG, 12 LEAD, INITIAL
Atrial Rate: 131 {beats}/min
Calculated R Axis: -19 degrees
Calculated T Axis: 127 degrees
Q-T Interval: 304 ms
QRS Duration: 104 ms
QTC Calculation (Bezet): 411 ms
Ventricular Rate: 110 {beats}/min

## 2017-11-05 LAB — PTT: aPTT: 69.1 s — ABNORMAL HIGH (ref 24.7–39.8)

## 2017-11-05 LAB — EKG 12-LEAD
Atrial Rate: 131 {beats}/min
Q-T Interval: 304 ms
QRS Duration: 104 ms
QTc Calculation (Bazett): 411 ms
R Axis: -19 degrees
T Axis: 127 degrees
Ventricular Rate: 110 {beats}/min

## 2017-11-05 LAB — BASIC METABOLIC PANEL
Anion Gap: 6 mmol/L — ABNORMAL LOW (ref 7–16)
BUN: 16 MG/DL (ref 8–23)
CO2: 31 mmol/L (ref 21–32)
Calcium: 8.9 MG/DL (ref 8.3–10.4)
Chloride: 105 mmol/L (ref 98–107)
Creatinine: 1.22 MG/DL (ref 0.8–1.5)
EGFR IF NonAfrican American: >60 mL/min/{1.73_m2} (ref >60)
GFR African American: >60 mL/min/{1.73_m2} (ref >60)
Glucose: 99 mg/dL (ref 65–100)
Potassium: 4 mmol/L (ref 3.5–5.1)
Sodium: 142 mmol/L (ref 136–145)

## 2017-11-05 LAB — CBC
Hematocrit: 42.7 % (ref 41.1–50.3)
Hemoglobin: 13.6 g/dL (ref 13.6–17.2)
MCH: 28.5 PG (ref 26.1–32.9)
MCHC: 31.9 g/dL (ref 31.4–35.0)
MCV: 89.3 FL (ref 79.6–97.8)
MPV: 11.6 FL (ref 9.4–12.3)
NRBC Absolute: 0 10*3/uL (ref 0.0–0.2)
Platelets: 181 10*3/uL (ref 150–450)
RBC: 4.78 M/uL (ref 4.23–5.6)
RDW: 16.2 % — ABNORMAL HIGH (ref 11.9–14.6)
WBC: 5.1 10*3/uL (ref 4.3–11.1)

## 2017-11-05 LAB — PROTIME-INR
INR: 1.1
INR: 1.1
Protime: 13.8 s (ref 11.7–14.5)
Protime: 14.5 s (ref 11.7–14.5)

## 2017-11-05 LAB — APTT: aPTT: 69.1 s — ABNORMAL HIGH (ref 24.7–39.8)

## 2017-11-05 MED ORDER — DILTIAZEM HCL 5 MG/ML IV SOLN
5 mg/mL | Freq: Once | INTRAVENOUS | Status: AC
Start: 2017-11-05 — End: 2017-11-05
  Administered 2017-11-05: 13:00:00 via INTRAVENOUS

## 2017-11-05 MED ORDER — WARFARIN 5 MG TAB
5 mg | Freq: Every evening | ORAL | Status: DC
Start: 2017-11-05 — End: 2017-11-05

## 2017-11-05 MED ORDER — SODIUM CHLORIDE 0.9 % IV
INTRAVENOUS | Status: DC
Start: 2017-11-05 — End: 2017-11-05
  Administered 2017-11-05: 12:00:00 via INTRAVENOUS

## 2017-11-05 MED ORDER — PERFLUTREN LIPID MICROSPHERES 1.1 MG/ML IV
1.1 mg/mL | INTRAVENOUS | Status: AC | PRN
Start: 2017-11-05 — End: 2017-11-05

## 2017-11-05 MED ORDER — POTASSIUM CHLORIDE SR 10 MEQ TAB, PARTICLES/CRYSTALS
10 mEq | Freq: Two times a day (BID) | ORAL | Status: DC
Start: 2017-11-05 — End: 2017-11-07
  Administered 2017-11-05 – 2017-11-07 (×6): via ORAL

## 2017-11-05 MED ORDER — METOPROLOL SUCCINATE SR 25 MG 24 HR TAB
25 mg | Freq: Every day | ORAL | Status: DC
Start: 2017-11-05 — End: 2017-11-05

## 2017-11-05 MED ORDER — METOPROLOL SUCCINATE SR 100 MG 24 HR TAB
100 mg | Freq: Every day | ORAL | Status: DC
Start: 2017-11-05 — End: 2017-11-06
  Administered 2017-11-05: 16:00:00 via ORAL

## 2017-11-05 MED ORDER — DIGOXIN 0.25 MG TAB
0.25 mg | Freq: Every day | ORAL | Status: DC
Start: 2017-11-05 — End: 2017-11-07
  Administered 2017-11-05: 16:00:00 via ORAL

## 2017-11-05 MED ORDER — LOSARTAN 25 MG TAB
25 mg | Freq: Every day | ORAL | Status: DC
Start: 2017-11-05 — End: 2017-11-07
  Administered 2017-11-05 – 2017-11-07 (×3): via ORAL

## 2017-11-05 MED ORDER — WARFARIN 7.5 MG TAB
7.5 mg | Freq: Every evening | ORAL | Status: DC
Start: 2017-11-05 — End: 2017-11-06
  Administered 2017-11-05: 22:00:00 via ORAL

## 2017-11-05 MED ORDER — EZETIMIBE 10 MG TAB
10 mg | Freq: Every day | ORAL | Status: DC
Start: 2017-11-05 — End: 2017-11-07
  Administered 2017-11-05 – 2017-11-07 (×3): via ORAL

## 2017-11-05 MED ORDER — HEPARIN (PORCINE) IN D5W 25,000 UNIT/500 ML IV
25000 unit/500 mL (50 unit/mL) | INTRAVENOUS | Status: DC
Start: 2017-11-05 — End: 2017-11-05

## 2017-11-05 MED ORDER — HEPARIN (PORCINE) 5,000 UNIT/ML IJ SOLN
5000 unit/mL | Freq: Once | INTRAMUSCULAR | Status: AC
Start: 2017-11-05 — End: 2017-11-05
  Administered 2017-11-05: 15:00:00 via INTRAVENOUS

## 2017-11-05 MED ORDER — FUROSEMIDE 40 MG TAB
40 mg | Freq: Every day | ORAL | Status: DC
Start: 2017-11-05 — End: 2017-11-07
  Administered 2017-11-05 – 2017-11-07 (×3): via ORAL

## 2017-11-05 MED ORDER — MIDAZOLAM 1 MG/ML IJ SOLN
1 mg/mL | INTRAMUSCULAR | Status: DC | PRN
Start: 2017-11-05 — End: 2017-11-05
  Administered 2017-11-05 (×3): via INTRAVENOUS

## 2017-11-05 MED ORDER — TEMAZEPAM 15 MG CAP
15 mg | Freq: Every evening | ORAL | Status: DC | PRN
Start: 2017-11-05 — End: 2017-11-07

## 2017-11-05 MED ORDER — ATORVASTATIN 10 MG TAB
10 mg | Freq: Every day | ORAL | Status: DC
Start: 2017-11-05 — End: 2017-11-07

## 2017-11-05 MED ORDER — LIDOCAINE 2 % MUCOSAL SOLN
2 % | Status: AC
Start: 2017-11-05 — End: ?

## 2017-11-05 MED ORDER — MIDAZOLAM 1 MG/ML IJ SOLN
1 mg/mL | INTRAMUSCULAR | Status: AC
Start: 2017-11-05 — End: ?

## 2017-11-05 MED ORDER — HEPARIN (PORCINE) 5,000 UNIT/ML IJ SOLN
5000 unit/mL | Freq: Once | INTRAMUSCULAR | Status: AC
Start: 2017-11-05 — End: 2017-11-05
  Administered 2017-11-05: 23:00:00 via INTRAVENOUS

## 2017-11-05 MED ORDER — HEPARIN (PORCINE) IN D5W 25,000 UNIT/500 ML IV
25000 unit/500 mL (50 unit/mL) | INTRAVENOUS | Status: AC
Start: 2017-11-05 — End: 2017-11-06
  Administered 2017-11-05 – 2017-11-06 (×6): via INTRAVENOUS

## 2017-11-05 MED ORDER — NITROGLYCERIN 0.4 MG SUBLINGUAL TAB
0.4 mg | SUBLINGUAL | Status: DC | PRN
Start: 2017-11-05 — End: 2017-11-07

## 2017-11-05 MED ORDER — FENTANYL CITRATE (PF) 50 MCG/ML IJ SOLN
50 mcg/mL | INTRAMUSCULAR | Status: DC | PRN
Start: 2017-11-05 — End: 2017-11-05
  Administered 2017-11-05: 12:00:00 via INTRAVENOUS

## 2017-11-05 MED ORDER — LIDOCAINE 2 % MUCOSAL SOLN
2 % | Status: DC | PRN
Start: 2017-11-05 — End: 2017-11-07
  Administered 2017-11-05: 12:00:00 via OROMUCOSAL

## 2017-11-05 MED ORDER — FENTANYL CITRATE (PF) 50 MCG/ML IJ SOLN
50 mcg/mL | INTRAMUSCULAR | Status: AC
Start: 2017-11-05 — End: ?

## 2017-11-05 MED ORDER — CLOPIDOGREL 75 MG TAB
75 mg | Freq: Every day | ORAL | Status: DC
Start: 2017-11-05 — End: 2017-11-07
  Administered 2017-11-05 – 2017-11-07 (×3): via ORAL

## 2017-11-05 MED FILL — LIDOCAINE 2 % MUCOSAL SOLN: 2 % | Qty: 45

## 2017-11-05 MED FILL — POTASSIUM CHLORIDE SR 10 MEQ TAB, PARTICLES/CRYSTALS: 10 mEq | ORAL | Qty: 1

## 2017-11-05 MED FILL — DIGOXIN 0.25 MG TAB: 0.25 mg | ORAL | Qty: 1

## 2017-11-05 MED FILL — ATORVASTATIN 10 MG TAB: 10 mg | ORAL | Qty: 1

## 2017-11-05 MED FILL — DEFINITY 1.1 MG/ML INTRAVENOUS SUSPENSION: 1.1 mg/mL | INTRAVENOUS | Qty: 1.3

## 2017-11-05 MED FILL — LOSARTAN 25 MG TAB: 25 mg | ORAL | Qty: 1

## 2017-11-05 MED FILL — FUROSEMIDE 40 MG TAB: 40 mg | ORAL | Qty: 1

## 2017-11-05 MED FILL — WARFARIN 7.5 MG TAB: 7.5 mg | ORAL | Qty: 1

## 2017-11-05 MED FILL — DILTIAZEM HCL 5 MG/ML IV SOLN: 5 mg/mL | INTRAVENOUS | Qty: 10

## 2017-11-05 MED FILL — HEPARIN (PORCINE) 5,000 UNIT/ML IJ SOLN: 5000 unit/mL | INTRAMUSCULAR | Qty: 1

## 2017-11-05 MED FILL — FENTANYL CITRATE (PF) 50 MCG/ML IJ SOLN: 50 mcg/mL | INTRAMUSCULAR | Qty: 2

## 2017-11-05 MED FILL — HEPARIN (PORCINE) IN D5W 25,000 UNIT/500 ML IV: 25000 unit/500 mL (50 unit/mL) | INTRAVENOUS | Qty: 500

## 2017-11-05 MED FILL — CLOPIDOGREL 75 MG TAB: 75 mg | ORAL | Qty: 1

## 2017-11-05 MED FILL — EZETIMIBE 10 MG TAB: 10 mg | ORAL | Qty: 1

## 2017-11-05 MED FILL — MIDAZOLAM 1 MG/ML IJ SOLN: 1 mg/mL | INTRAMUSCULAR | Qty: 10

## 2017-11-05 MED FILL — METOPROLOL SUCCINATE SR 100 MG 24 HR TAB: 100 mg | ORAL | Qty: 1

## 2017-11-05 NOTE — Progress Notes (Signed)
 Warfarin dosing per pharmacist    [name] [name] is a 62 y.o. male.    Height: 6' 4 (193 cm)    Weight: 111.1 kg (245 lb)    Indication:  afib    Goal INR:  2-3    Home dose:  New start    Risk factors or significant drug interactions:  plavix  may increase bleeding potential    Other anticoagulants:  Heparin drip    Daily Monitoring  Date  INR     Warfarin dose HGB              Notes  7/22  1.1  7.5 mg  13.6        Pharmacy to assist in dosing warfarin. New start; will initiate at 7.5mg  daily given weight > 100kg. Daily INR ordered. Pharmacy will continue to monitor and adjust as needed.      Thank you,  Mark Elizalde PharmD  [phone]

## 2017-11-05 NOTE — Progress Notes (Signed)
Problem: Patient Education: Go to Patient Education Activity  Goal: Patient/Family Education  Outcome: Progressing Towards Goal     Problem: Afib Pathway: Day 1  Goal: Diagnostic Test/Procedures  Outcome: Progressing Towards Goal

## 2017-11-05 NOTE — Progress Notes (Signed)
TEE with Dr Daleen Snookebe   Versed 5 mg  Fentanyl 50 mcg  Unable to perform CVN due to clot in left atrial appendage   Pt tolerated procedure well  Pt in CPRU room 8  Report given to Genworth Financialllison RN

## 2017-11-05 NOTE — Progress Notes (Signed)
Pt refused to take Lipitor stating that he had had a negative reaction to it with weakness and pain in legs.      Did education with pt and his wife about heart failure as well as a fib.  Pt is resistant to new teaching as he believes it is medications that have been making him feel so bad and not the chronic conditions.

## 2017-11-05 NOTE — Progress Notes (Signed)
Attempted visit  Staff was in room providing compassionate care  Will follow up      Farrel Gobbleandy Brookshire, staff chaplain, MDiv, [facility]  [phone]  /   [email or web address]

## 2017-11-05 NOTE — Progress Notes (Signed)
Verbal bedside report received from Lear NgJason and Jenny, Charity fundraiserN. Assumed care of patient. Heparin IV drip verified at bedside and in chart with outgoing RN.

## 2017-11-05 NOTE — Progress Notes (Signed)
 Nutrition  Reason for assessment: Referral received from nursing admission Malnutrition Screening Tool   Recently Lost Weight Without Trying: Yes  If Yes, How Much Weight Loss: 14 - 23 lbs  Eating Poorly Due to Decreased Appetite: Yes  Assessment:   Diet: DIET CARDIAC Regular    Food/Nutrition Patient History: Pt reports a 15 pound weight loss since his stroke ~4 weeks ago.  Per pt, he can no longer eat as much as he used to.  Has c/o early satiety.  Pt reports that he has removed gluten, dairy, eggs, and chocolate from his diet as of ~3 years ago.  Pt states that he felt bad all of the time and changed his diet.  Diet recall includes gluten free oats or cheerios with almond milk at breakfast and various items at lunches and dinners, including salads, chicken fish, beans vegetables, and some fruits.  He is s/p TEE and cardioversion this morning.  Pt reports a UBW of ~160 pounds.  Anthropometrics:Height: 6' 4 (193 cm),  Weight: 111.1 kg (245 lb),  , Body mass index is 29.82 kg/m. BMI class of overweight for age <65 years.  WT / BMI 11/05/2017 10/23/2017 10/08/2017 09/14/2017   WEIGHT 245 lb  252 lb 9.6 oz 259 lb   Per weights listed in EMR, potential for a 7 pound, 2.7% weight loss within 1 month.    Macronutrient needs:  EER:  2222-2778 kcal /day (20-25 kcal/kg listed BW)  EPR:  89-111 grams protein/day (0.8-1 grams/kg listed BW)  Intake/Comparative Standards: No recorded meal intakes.    Nutrition Diagnosis: Unintended weight loss r/t decreased energy intake, as evidenced by pt with report of early satiety and weight loss noted above.    Intervention:  Meals and snacks: Continue current diet. Collected lunch order.  Add preferences. Add double protein portions.  Nutrition Supplement Therapy: none at this time.    Discharge nutrition plan: Too soon to determine.  Coordination of Nutrition Care: Randall PEAK and catering associate.    Lyle Pesa, MS, RD, LD, CNSC  [phone]

## 2017-11-05 NOTE — Progress Notes (Signed)
 TRANSFER - OUT REPORT:    Verbal report given to Jenni,RN(name) on [name] [name]  being transferred to telemetry(unit) for routine progression of care       Report consisted of patient's Situation, Background, Assessment and   Recommendations(SBAR).     Information from the following report(s) Kardex, Procedure Summary and Cardiac Rhythm atrial fib was reviewed with the receiving nurse.    Lines:   Peripheral IV 11/05/17 Right Antecubital (Active)        Opportunity for questions and clarification was provided.      Patient transported with:   Monitor  Registered Nurse

## 2017-11-05 NOTE — Progress Notes (Signed)
MSW met with pt and spouse at bedside.  Pt is uninsured but he has a PCP and has been mediations by taking generic meds with low cost.  [employer] to see about financial assistance options.  Couple anticipate no supportive care needs but will remain available to assist if needs arise.    Care Management Interventions  PCP Verified by CM: Eduardo Osier)  Mode of Transport at Discharge: (spouse)  Transition of Care Consult (CM Consult): Discharge Planning(Pt is employed but uninsured.  [employer] to see.  Has been able to purchase generic and low cost meds but would have difficulty if placed on high cost meds.)  Physical Therapy Consult: No  Occupational Therapy Consult: No  Speech Therapy Consult: No  Current Support Network: Lives with Spouse  Confirm Follow Up Transport: Family  Plan discussed with Pt/Family/Caregiver: Yes  Freedom of Choice Offered: Yes  Veteran Resource Information Provided?: No  Discharge Location  Discharge Placement: Home

## 2017-11-05 NOTE — Progress Notes (Signed)
 TRANSFER - IN REPORT:    Verbal report received from Isaiah Smock, RN on [name] [name]  being received from CCL for routine progression of care.    Report consisted of patient's Situation, Background, Assessment and   Recommendations(SBAR).     Information from the following reports was reviewed: Kardex, Procedure Summary, MAR and Recent Results.    Opportunity for questions and clarification was provided.      Assessment completed upon patient's arrival to unit and care assumed.     Patient received to room 310 and assessment completed. Patient connected to telemetry monitor and oriented to the room.  Pt had TEE and is unable to drink for another 30-45 minutes.      Pt stated that he is missing his glasses.  RN called recovery who stated that his glasses are not there and that maybe his wife has them.  Will follow up with pt wife

## 2017-11-05 NOTE — Consults (Signed)
Consults  by Rhett BannisterSenfield, Juliane Guest J, MD at 11/05/17 2134                Author: Rhett BannisterSenfield, Orlyn Odonoghue J, MD  Service: Cardiology  Author Type: Physician       Filed: 11/06/17 1518  Date of Service: 11/05/17 2134  Status: Addendum          Editor: Rhett BannisterSenfield, Jarelle Ates J, MD (Physician)          Related Notes: Original Note by Treasa SchoolJennings, Stelina D, NP (Nurse Practitioner) filed at 11/06/17  [phone]            Consult Orders        1. IP CONSULT TO ELECTROPHYSIOLOGY [[id number]][[id number]] ordered by Seabron Spatesebe, John E, MD at 11/05/17 [phone]                              [facility]                     Date of  Admission: 11/05/2017  7:03 AM       Primary Care Physician: Dr. Hyman HopesWebb   Primary Cardiologist: Dr. Daleen Snookebe   Referring Physician: Dr. Daleen Snookebe   Consulting Physician: Dr. Devona KonigSenfield      CC/Reason for consult: reduced EF, evaluation for ICD         [name] MaltaJames C [name] is a 62 y.o.  male with past medical history of anterior MI, CVA (May 2019, received TPA) , HTN, CAD, dyslipidemia, AFIB and HTN who underwent TEE on 11/05/17. He was found to have a reduced EF of 25% with anterior  apical akinesis. He was found to have an LA thrombus that was confirmed with definity. Patient was admitted and started on IV heparin with bridge to warfarin. Due to reduced EF, EP has been asked to see patient.            Patient Active Problem List        Diagnosis  Code         ?  Anterior myocardial infarction (HCC)  I21.09     ?  Essential hypertension, benign  I10     ?  Coronary atherosclerosis of native coronary artery  I25.10     ?  Dyslipidemia  E78.5     ?  Unstable angina (HCC)  I20.0     ?  Hypertension  I10     ?  CAD (coronary artery disease)  I25.10     ?  Chest pain, unspecified  R07.9     ?  Atrial fibrillation with RVR (HCC)  I48.91         ?  Thrombus of left atrial appendage  I51.3             Past Medical History:        Diagnosis  Date         ?  Atrial fibrillation with RVR (HCC)  11/05/2017     ?  CAD (coronary artery disease)       ?  CVA  (cerebral vascular accident) (HCC)  09/05/2017     ?  Dyslipidemia       ?  Hypertension           ?  Unstable angina (HCC)  02/02/2010           Past Surgical History:         Procedure  Laterality  Date          ?  CARDIAC SURG PROCEDURE UNLIST    11/2009          stent x1 LAD           ?  PR LEFT HEART CATH,PERCUTANEOUS    02/02/2010          stent x1          Allergies        Allergen  Reactions         ?  Aspirin  Hives     ?  Gluten  Other (comments)             Abdominal pain and hip pain         ?  Tylenol [Acetaminophen]  Hives         History reviewed. No pertinent family history.         Current Facility-Administered Medications          Medication  Dose  Route  Frequency           ?  lidocaine (XYLOCAINE) 2 % viscous solution 15 mL   15 mL  Mouth/Throat  PRN     ?  furosemide (LASIX) tablet 40 mg   40 mg  Oral  DAILY     ?  potassium chloride (KLOR-CON) tablet 10 mEq   10 mEq  Oral  BID     ?  losartan (COZAAR) tablet 25 mg   25 mg  Oral  DAILY     ?  metoprolol succinate (TOPROL-XL) XL tablet 100 mg   100 mg  Oral  DAILY     ?  digoxin (LANOXIN) tablet 0.25 mg   0.25 mg  Oral  DAILY     ?  temazepam (RESTORIL) capsule 15 mg   15 mg  Oral  QHS PRN     ?  nitroglycerin (NITROSTAT) tablet 0.4 mg   0.4 mg  SubLINGual  PRN     ?  ezetimibe (ZETIA) tablet 10 mg   10 mg  Oral  DAILY     ?  atorvastatin (LIPITOR) tablet 10 mg   10 mg  Oral  DAILY     ?  clopidogrel (PLAVIX) tablet 75 mg   75 mg  Oral  DAILY     ?  heparin 25,000 units in dextrose 500 mL infusion   12-25 Units/kg/hr (Adjusted)  IntraVENous  TITRATE           ?  warfarin (COUMADIN) tablet 7.5 mg   7.5 mg  Oral  QPM           Review of Systems    Constitutional: Negative.     HENT: Negative.     Respiratory: Negative.     Cardiovascular: Negative.     Gastrointestinal: Negative.     Genitourinary: Negative.     Musculoskeletal: Negative.     Skin: Negative.     Neurological: Negative.     Endo/Heme/Allergies: Negative.     Psychiatric/Behavioral:  The patient is nervous/anxious and has insomnia .            Physical Exam     Vitals:             11/05/17 1211  11/05/17 1639  11/05/17 1821  11/05/17 2102           BP:  122/82  132/86  (!) 130/92  136/85     Pulse:  (!) 109  (!) 110  96  (!) 117  Resp:    21    17     Temp:    97.7 ??F (36.5 ??C)    97.9 ??F (36.6 ??C)     SpO2:    99%  96%  96%     Weight:                   Height:                   Physical Exam:   Physical Exam    Constitutional: He is oriented to person, place, and time and well-developed, well-nourished, and in no distress.    HENT:    Head: Normocephalic.    Eyes: Pupils are equal, round, and reactive to light.    Neck: Normal range of motion.    Cardiovascular: An irregularly irregular rhythm present. Tachycardia  present.    Pulmonary/Chest: Effort normal and breath sounds normal.    Abdominal: Soft. Bowel sounds are normal.   Musculoskeletal: He exhibits edema.    Neurological: He is alert and oriented to person, place, and time.    Skin: Skin is warm and dry.   Psychiatric: Mood, memory, affect and judgment normal.          Cardiographics      Telemetry: AFIB with rate of 107   ECG: AFIB with RVR, rate of 110   Echocardiogram: see TEE report       Labs:      Recent Labs            11/05/17   1412  11/05/17   0732     NA   --   142     K   --   4.0     MG   --   2.3     BUN   --   16     CREA   --   1.22     GLU   --   99     WBC   --   5.1     HGB   --   13.6     HCT   --   42.7     PLT   --   181         INR  1.1  1.1              Assessment/Plan:              Atrial fibrillation with RVR -- not well controlled on digoxin and BB therapy.  Toprol XL increased to 100 mg BID. Marland Kitchen On IV heparin with bridge to warfarin for long-term anticoagulation.         Hypertension -- continue current medications. Monitor BP closely. Titrate medications as needed.         CAD (coronary artery disease) -- anterior MI. Likely cause of TEE findings. Continue Plavix, warfarin, BB, ARB and statin (low-dose)         Thrombus of left atrial appendage -- IV heparin with bridge to warfarin        Ischemic cardiomyopathy -- EF 25% by recent TEE.        Thank you very much for this referral. We appreciate the opportunity to participate in this patient's care. We will follow along with above stated plan.      Treasa School, NP   Consulting MD: Devona Konig      I have personally seen and examined the patient  with the physician extender, Ellin Saba, NP, and agree with above assessment/plan. In addition:         62 year old male with persistent AF with finding of LAA thrombus and reduced EF. I suspect his EF is reduced, at least in part from his AF. Maintenance of rhythm is ideal but we cannot do this at this time given his presence of LAA thrombus. Given the  nature of warfarin and high risk of subtherapeutic dosing that comes with warfarin, we will place him on Eliquis for better coverage of his anticoagulation. He will need a follow up TEE in 4-6 weeks and we can consider further therapies for his AF at  this time. Will increase Toprol as tolerated for now. If his EF does not recover, he can be considered for an ICD down the line. We discussed the LifeVest and the patient will think about it.       Thank you for allowing me to participate in the electrophysiologic care of [name] [name]. Please contact me if any questions or concerns were to arise.        [name]. Devona Konig, MD, MS   Clinical Cardiac Electrophysiology   [facility]

## 2017-11-05 NOTE — Progress Notes (Signed)
 11/05/17 1821   Vital Signs   Pulse (Heart Rate) 96   Heart Rate Source Monitor   O2 Sat (%) 96 %   Level of Consciousness Alert   BP (!) 130/92   MAP (Calculated) 105   BP 1 Method Automatic   BP 1 Location Right arm   BP Patient Position Sitting     Called into Pt. Room because he stated he was feeling a little Wonkey.  Arrived to pt room and he was sitting in the chair.  He stated that he just wanted to let someone know.  Brief neuro was completed.  Pt had no neurological deficits.  Upon further evaluation he stated that when he does this at home it usually clears in a little bit of time  Pt inquired if it might be caused today by the medication (coumadin) that he had taken about an hour earlier.  Pr reassured that we would be watching him.  He wanted to make sure that we would come if he called.  Reviewed with him the call system and demonstrated how it works.  Pt felt better by th time I left the room

## 2017-11-05 NOTE — H&P (Signed)
H&P by Seabron Spatesebe, Lenore Moyano E, MD at  11/05/17 [id number]                Author: Seabron Spatesebe, Azelyn Batie E, MD  Service: Cardiology  Author Type: Physician       Filed: 11/05/17 0927  Date of Service: 11/05/17 0921  Status: Signed          Editor: Seabron Spatesebe, Suzann Lazaro E, MD (Physician)                      [facility] ADMIT NOTE      11/05/2017 9:21 AM      Admit Date: 11/05/2017      Admit Diagnosis: A-fib (HCC) [I48.91];Atrial fibrillation with RVR [facility](HCC) [I48.91]   Family Physician:    Other Physician:     Subjective:     [name] [name] is a 62 y.o.  male   who is here initially for afib and cardioversion.   He presents with afib RVR and TEE reveals reduced EF and LAA thrombus.    He has dyspnea and possible OSA .         Allergies        Allergen  Reactions         ?  Aspirin  Hives         ?  Tylenol [Acetaminophen]  Hives             Past Medical History:        Diagnosis  Date         ?  Atrial fibrillation with RVR (HCC)  11/05/2017     ?  CAD (coronary artery disease)       ?  CVA (cerebral vascular accident) (HCC)  09/05/2017     ?  Dyslipidemia       ?  Hypertension           ?  Unstable angina (HCC)  02/02/2010           Family History:   CAD not prevalent       Social History:  Alcohol:none                              Tobacco: .none      Review of Systems:   Constitutional- no recent fever, chills, abnormal weight loss   Eyes- no recent visual changes   Ears- no recent hearing loss   Cardiac- see HPI   Pulmonary- no hemoptysis    Gastrointestinal- no melena    Genitourinary- no hematuria or dysuria   Neurologic- recent CVA with full recovery    Musculoskeletal- no acute joint pain or myalgias                  Objective:           Vitals:             11/05/17 0839  11/05/17 0842  11/05/17 0845  11/05/17 0900           BP:  (!) 145/113  (!) 143/110  (!) 136/98  (!) 128/97     Pulse:  (!) 133  (!) 152  (!) 113  (!) 123     Resp:  18  19  12  21      Temp:             SpO2:  93%  94%  98%  99%  Weight:                   Height:                    Physical Exam:   Neuro:  alert and oriented. No focal neurologic deficits   Skin: warm and dry   HEENT:  NC/AT, conjunctiva clear, sclera anicteric,    Neck: supple without adenopathy or thyromegaly.  JVD   [id number] rate and rhythm, no murmurs, rubs, or gallops   Lungs: clear bilaterally   Abdomen: soft, , nondistended, normal bowel sounds   Extremities: minimal  edema           2+pulses       Psychiatric: normal mood and affect    EKG--afib RVR OAMI               No current facility-administered medications on file prior to encounter.           Current Outpatient Medications on File Prior to Encounter          Medication  Sig  Dispense  Refill           ?  metoprolol succinate (TOPROL-XL) 100 mg tablet  Take 1 Tab by mouth daily. Indications: Ventricular Rate Control in Atrial Fibrillation (Patient taking differently: Take 50 mg by mouth two (2) times  a day. Indications: Ventricular Rate Control in Atrial Fibrillation)  30 Tab  11     ?  furosemide (LASIX) 20 mg tablet  Take 1 Tab by mouth daily.  30 Tab  11     ?  loratadine (CLARITIN) 10 mg tablet  Take 10 mg by mouth daily.         ?  rivaroxaban (XARELTO) 20 mg tab tablet  Take 1 Tab by mouth daily (with lunch). Indications: Treatment to Prevent Blood Clots in Chronic Atrial Fibrillation  30 Tab  11     ?  lisinopril (PRINIVIL, ZESTRIL) 20 mg tablet  Take 20 mg by mouth daily.               ?  ezetimibe (ZETIA) 10 mg tablet  Take 10 mg by mouth daily.                        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

## 2017-11-05 NOTE — Progress Notes (Signed)
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

## 2017-11-05 NOTE — Progress Notes (Signed)
Last dose of Xarelto on 11/04/17 at 1200 noon.

## 2017-11-05 NOTE — Procedures (Signed)
Brief Cardiac Procedure Note    Patient: [name] [name] MRN: [id number]  SSN: [id number]    Date of Birth: Sep 03, 1955  Age: 62 y.o.  Sex: male      Date of Procedure: 11/05/2017     Pre-procedure Diagnosis: Atrial Fibrillation/Atrial Flutter    Post-procedure Diagnosis: AFIB RVR  LA thrombus  ischemic CM     Reason for Procedure: Cardiac Arrhythmia    Procedure: Transesophageal Echocardiogram    Brief Description of Procedure: TEE standard with Definity      Performed By: Seabron SpatesJohn E Cleland Simkins, MD     Assistants: RN    Anesthesia: Moderate Sedation    Estimated Blood Loss: Less than 10 mL      Specimens: None    Implants: None    Findings: LV with anterior apical akinesis   EF 25%   LA LV smoke  LA thrombus confirmed by definity     Complications: None    Recommendations: admit for afib control CM management .    Signed By: Seabron SpatesJohn E Marabella Popiel, MD     November 05, 2017

## 2017-11-05 NOTE — Progress Notes (Signed)
Verbal bedside report given to Jana HalfKatharine Peh RN, Public relations account executiveoncoming RN. Patient's situation, background, assessment and recommendations provided. Opportunity for questions provided. Oncoming RN assumed care of patient. Heparin IV drip verified at bedside with oncoming RN.  Pt in bed reading

## 2017-11-05 NOTE — Progress Notes (Signed)
Pt skin is unremarkable. Buttock and heals better.

## 2017-11-05 NOTE — Progress Notes (Signed)
Patient received to CPRU room # 8  Ambulatory from lobby. Patient scheduled for TEE/CVN today with Dr Daleen Snookebe. Procedure reviewed & questions answered, voiced good understanding consent obtained & placed on chart. All medications and medical history reviewed. Will prep patient per orders. Patient & family updated on plan of care.      The patient has a fraility score of 4-VULNERABLE, based on independent of ADLs/ambulation. Slow to ambulate. Increased symptoms with exertion.

## 2017-11-05 NOTE — Progress Notes (Signed)
Patient received to CPRU room # 8  Ambulatory from lobby. Patient scheduled for TEE/CVN today with Dr Cebe. Procedure reviewed & questions answered, voiced good understanding consent obtained & placed on chart. All medications and medical history reviewed. Will prep patient per orders. Patient & family updated on plan of care.      The patient has a fraility score of 4-VULNERABLE, based on independent of ADLs/ambulation. Slow to ambulate. Increased symptoms with exertion.

## 2017-11-05 NOTE — Progress Notes (Signed)
MSW met with pt and spouse at bedside.  Pt is uninsured but he has a PCP and has been mediations by taking generic meds with low cost.  [employer] to see about financial assistance options.  Couple anticipate no supportive care needs but will remain available to assist if needs arise.    Care Management Interventions  PCP Verified by CM: Kyung Bacca)  Mode of Transport at Discharge: (spouse)  Transition of Care Consult (CM Consult): Discharge Planning(Pt is employed but uninsured.  [employer] to see.  Has been able to purchase generic and low cost meds but would have difficulty if placed on high cost meds.)  Physical Therapy Consult: No  Occupational Therapy Consult: No  Speech Therapy Consult: No  Current Support Network: Lives with Spouse  Confirm Follow Up Transport: Family  Plan discussed with Pt/Family/Caregiver: Yes  Freedom of Choice Offered: Yes  Veteran Resource Information Provided?: No  Discharge Location  Discharge Placement: Home

## 2017-11-05 NOTE — Progress Notes (Signed)
11/05/17 1821   Vital Signs   Pulse (Heart Rate) 96   Heart Rate Source Monitor   O2 Sat (%) 96 %   Level of Consciousness Alert   BP (!) 130/92   MAP (Calculated) 105   BP 1 Method Automatic   BP 1 Location Right arm   BP Patient Position Sitting     Called into Pt. Room because he stated he was feeling a little "[place]onkey".  Arrived to pt room and he was sitting in the chair.  He stated that he just wanted to let someone know.  Brief neuro was completed.  Pt had no neurological deficits.  Upon further evaluation he stated that "when he does this at home it usually clears in a little bit of time"  Pt inquired if it might be caused today by the medication (coumadin) that he had taken about an hour earlier.  Pr reassured that we would be watching him.  He wanted to make sure that we would come if he called.  Reviewed with him the call system and demonstrated how it works.  Pt felt better by th time I left the room

## 2017-11-05 NOTE — Progress Notes (Signed)
Pt skin is unremarkable. Buttock and heals better.

## 2017-11-05 NOTE — H&P (Signed)
[facility] ADMIT NOTE    11/05/2017 9:21 AM    Admit Date: 11/05/2017    Admit Diagnosis: A-fib (HCC) [I48.91];Atrial fibrillation with RVR [facility]) [I48.91]  Family Physician:   Other Physician:  Subjective:   [name] [name] is a 62 y.o. male  who is here initially for afib and cardioversion.   He presents with afib RVR and TEE reveals reduced EF and LAA thrombus.    He has dyspnea and possible OSA .     Allergies   Allergen Reactions   ??? Aspirin Hives   ??? Tylenol [Acetaminophen] Hives       Past Medical History:   Diagnosis Date   ??? Atrial fibrillation with RVR (HCC) 11/05/2017   ??? CAD (coronary artery disease)    ??? CVA (cerebral vascular accident) (HCC) 09/05/2017   ??? Dyslipidemia    ??? Hypertension    ??? Unstable angina (HCC) 02/02/2010       Family History:   CAD not prevalent     Social History:  Alcohol:none                             Tobacco: .none    Review of Systems:  Constitutional- no recent fever, chills, abnormal weight loss  Eyes- no recent visual changes  Ears- no recent hearing loss  Cardiac- see HPI  Pulmonary- no hemoptysis   Gastrointestinal- no melena   Genitourinary- no hematuria or dysuria  Neurologic- recent CVA with full recovery   Musculoskeletal- no acute joint pain or myalgias           Objective:      Vitals:    11/05/17 0839 11/05/17 0842 11/05/17 0845 11/05/17 0900   BP: (!) 145/113 (!) 143/110 (!) 136/98 (!) 128/97   Pulse: (!) 133 (!) 152 (!) 113 (!) 123   Resp: 18 19 12 21    Temp:       SpO2: 93% 94% 98% 99%   Weight:       Height:           Physical Exam:  Neuro:  alert and oriented. No focal neurologic deficits  Skin: warm and dry  HEENT:  NC/AT, conjunctiva clear, sclera anicteric,   Neck: supple without adenopathy or thyromegaly.  JVD  [id number] rate and rhythm, no murmurs, rubs, or gallops  Lungs: clear bilaterally  Abdomen: soft, , nondistended, normal bowel sounds  Extremities: minimal  edema           2+pulses     Psychiatric: normal mood and affect    EKG--afib RVR OAMI         No current facility-administered medications on file prior to encounter.      Current Outpatient Medications on File Prior to Encounter   Medication Sig Dispense Refill   ??? metoprolol succinate (TOPROL-XL) 100 mg tablet Take 1 Tab by mouth daily. Indications: Ventricular Rate Control in Atrial Fibrillation (Patient taking differently: Take 50 mg by mouth two (2) times a day. Indications: Ventricular Rate Control in Atrial Fibrillation) 30 Tab 11   ??? furosemide (LASIX) 20 mg tablet Take 1 Tab by mouth daily. 30 Tab 11   ??? loratadine (CLARITIN) 10 mg tablet Take 10 mg by mouth daily.     ??? rivaroxaban (XARELTO) 20 mg tab tablet Take 1 Tab by mouth daily (with lunch). Indications: Treatment to Prevent Blood Clots in Chronic Atrial Fibrillation 30 Tab 11   ??? lisinopril (PRINIVIL,  ZESTRIL) 20 mg tablet Take 20 mg by mouth daily.     ??? ezetimibe (ZETIA) 10 mg tablet Take 10 mg by mouth daily.                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

## 2017-11-05 NOTE — Consults (Addendum)
[facility] Consult                Date of  Admission: 11/05/2017  7:03 AM     Primary Care Physician: Dr. Hyman HopesWebb  Primary Cardiologist: Dr. Daleen Snookebe  Referring Physician: Dr. Daleen Snookebe  Consulting Physician: Dr. Devona KonigSenfield    CC/Reason for consult: reduced EF, evaluation for ICD      [name] [name] is a 62 y.o. male with past medical history of anterior MI, CVA (May 2019, received TPA) , HTN, CAD, dyslipidemia, AFIB and HTN who underwent TEE on 11/05/17. He was found to have a reduced EF of 25% with anterior apical akinesis. He was found to have an LA thrombus that was confirmed with definity. Patient was admitted and started on IV heparin with bridge to warfarin. Due to reduced EF, EP has been asked to see patient.       Patient Active Problem List   Diagnosis Code   ??? Anterior myocardial infarction (HCC) I21.09   ??? Essential hypertension, benign I10   ??? Coronary atherosclerosis of native coronary artery I25.10   ??? Dyslipidemia E78.5   ??? Unstable angina (HCC) I20.0   ??? Hypertension I10   ??? CAD (coronary artery disease) I25.10   ??? Chest pain, unspecified R07.9   ??? Atrial fibrillation with RVR (HCC) I48.91   ??? Thrombus of left atrial appendage I51.3       Past Medical History:   Diagnosis Date   ??? Atrial fibrillation with RVR (HCC) 11/05/2017   ??? CAD (coronary artery disease)    ??? CVA (cerebral vascular accident) (HCC) 09/05/2017   ??? Dyslipidemia    ??? Hypertension    ??? Unstable angina (HCC) 02/02/2010      Past Surgical History:   Procedure Laterality Date   ??? CARDIAC SURG PROCEDURE UNLIST  11/2009    stent x1 LAD    ??? PR LEFT HEART CATH,PERCUTANEOUS  02/02/2010    stent x1     Allergies   Allergen Reactions   ??? Aspirin Hives   ??? Gluten Other (comments)     Abdominal pain and hip pain   ??? Tylenol [Acetaminophen] Hives      History reviewed. No pertinent family history.     Current Facility-Administered Medications   Medication Dose Route Frequency   ??? lidocaine (XYLOCAINE) 2 % viscous solution 15 mL  15 mL Mouth/Throat PRN    ??? furosemide (LASIX) tablet 40 mg  40 mg Oral DAILY   ??? potassium chloride (KLOR-CON) tablet 10 mEq  10 mEq Oral BID   ??? losartan (COZAAR) tablet 25 mg  25 mg Oral DAILY   ??? metoprolol succinate (TOPROL-XL) XL tablet 100 mg  100 mg Oral DAILY   ??? digoxin (LANOXIN) tablet 0.25 mg  0.25 mg Oral DAILY   ??? temazepam (RESTORIL) capsule 15 mg  15 mg Oral QHS PRN   ??? nitroglycerin (NITROSTAT) tablet 0.4 mg  0.4 mg SubLINGual PRN   ??? ezetimibe (ZETIA) tablet 10 mg  10 mg Oral DAILY   ??? atorvastatin (LIPITOR) tablet 10 mg  10 mg Oral DAILY   ??? clopidogrel (PLAVIX) tablet 75 mg  75 mg Oral DAILY   ??? heparin 25,000 units in dextrose 500 mL infusion  12-25 Units/kg/hr (Adjusted) IntraVENous TITRATE   ??? warfarin (COUMADIN) tablet 7.5 mg  7.5 mg Oral QPM       Review of Systems   Constitutional: Negative.    HENT: Negative.    Respiratory: Negative.  Cardiovascular: Negative.    Gastrointestinal: Negative.    Genitourinary: Negative.    Musculoskeletal: Negative.    Skin: Negative.    Neurological: Negative.    Endo/Heme/Allergies: Negative.    Psychiatric/Behavioral: The patient is nervous/anxious and has insomnia.         Physical Exam  Vitals:    11/05/17 1211 11/05/17 1639 11/05/17 1821 11/05/17 2102   BP: 122/82 132/86 (!) 130/92 136/85   Pulse: (!) 109 (!) 110 96 (!) 117   Resp:  21  17   Temp:  97.7 ??F (36.5 ??C)  97.9 ??F (36.6 ??C)   SpO2:  99% 96% 96%   Weight:       Height:           Physical Exam:  Physical Exam   Constitutional: He is oriented to person, place, and time and well-developed, well-nourished, and in no distress.   HENT:   Head: Normocephalic.   Eyes: Pupils are equal, round, and reactive to light.   Neck: Normal range of motion.   Cardiovascular: An irregularly irregular rhythm present. Tachycardia present.   Pulmonary/Chest: Effort normal and breath sounds normal.   Abdominal: Soft. Bowel sounds are normal.   Musculoskeletal: He exhibits edema.    Neurological: He is alert and oriented to person, place, and time.   Skin: Skin is warm and dry.   Psychiatric: Mood, memory, affect and judgment normal.       Cardiographics    Telemetry: AFIB with rate of 107  ECG: AFIB with RVR, rate of 110  Echocardiogram: see TEE report     Labs:   Recent Labs     11/05/17  1412 11/05/17  0732   NA  --  142   K  --  4.0   MG  --  2.3   BUN  --  16   CREA  --  1.22   GLU  --  99   WBC  --  5.1   HGB  --  13.6   HCT  --  42.7   PLT  --  181   INR 1.1 1.1        Assessment/Plan:          Atrial fibrillation with RVR -- not well controlled on digoxin and BB therapy.  Toprol XL increased to 100 mg BID. Marland Kitchen On IV heparin with bridge to warfarin for long-term anticoagulation.       Hypertension -- continue current medications. Monitor BP closely. Titrate medications as needed.       CAD (coronary artery disease) -- anterior MI. Likely cause of TEE findings. Continue Plavix, warfarin, BB, ARB and statin (low-dose)      Thrombus of left atrial appendage -- IV heparin with bridge to warfarin      Ischemic cardiomyopathy -- EF 25% by recent TEE.      Thank you very much for this referral. We appreciate the opportunity to participate in this patient's care. We will follow along with above stated plan.    Treasa School, NP  Consulting MD: Devona Konig    I have personally seen and examined the patient with the physician extender, Ellin Saba, NP, and agree with above assessment/plan. In addition:      62 year old male with persistent AF with finding of LAA thrombus and reduced EF. I suspect his EF is reduced, at least in part from his AF. Maintenance of rhythm is ideal but we cannot do this at this  time given his presence of LAA thrombus. Given the nature of warfarin and high risk of subtherapeutic dosing that comes with warfarin, we will place him on Eliquis for better coverage of his anticoagulation. He will need a follow  up TEE in 4-6 weeks and we can consider further therapies for his AF at this time. Will increase Toprol as tolerated for now. If his EF does not recover, he can be considered for an ICD down the line. We discussed the LifeVest and the patient will think about it.     Thank you for allowing me to participate in the electrophysiologic care of [name] [name]. Please contact me if any questions or concerns were to arise.      [name]. Devona Konig, MD, MS  Clinical Cardiac Electrophysiology  [facility]

## 2017-11-05 NOTE — Progress Notes (Signed)
Verbal bedside report received from Jason and Jenny, RN. Assumed care of patient. Heparin IV drip verified at bedside and in chart with outgoing RN.

## 2017-11-05 NOTE — Progress Notes (Addendum)
Verbal bedside report given to Katharine Peh RN, oncoming RN. Patient's situation, background, assessment and recommendations provided. Opportunity for questions provided. Oncoming RN assumed care of patient. Heparin IV drip verified at bedside with oncoming RN.  Pt in bed reading

## 2017-11-05 NOTE — Progress Notes (Signed)
Attempted visit  Staff was in room providing compassionate care  Will follow up      Randy Brookshire, staff chaplain, MDiv, [facility]  C:[phone]  /   [email or web address]

## 2017-11-05 NOTE — Progress Notes (Signed)
TEE with Dr Cebe   Versed 5 mg  Fentanyl 50 mcg  Unable to perform CVN due to clot in left atrial appendage   Pt tolerated procedure well  Pt in CPRU room 8  Report given to Allison RN

## 2017-11-05 NOTE — Progress Notes (Signed)
Pt refused to take Lipitor stating that he had had a negative reaction to it with weakness and pain in legs.      Did education with pt and his wife about heart failure as well as a fib.  Pt is resistant to new teaching as he believes it is medications that have been making him feel so bad and not the chronic conditions.

## 2017-11-05 NOTE — Progress Notes (Addendum)
Nutrition  Reason for assessment: Referral received from nursing admission Malnutrition Screening Tool   Recently Lost Weight Without Trying: Yes  If Yes, How Much Weight Loss: 14 - 23 lbs  Eating Poorly Due to Decreased Appetite: Yes  Assessment:   Diet: DIET CARDIAC Regular    Food/Nutrition Patient History: Pt reports a 15 pound weight loss since his stroke ~4 weeks ago.  Per pt, he can no longer eat as much as he used to.  Has c/o early satiety.  Pt reports that he has removed gluten, dairy, eggs, and chocolate from his diet as of ~3 years ago.  Pt states that he felt "bad all of the time and changed his diet."  Diet recall includes gluten free oats or cheerios with almond milk at breakfast and various items at lunches and dinners, including salads, chicken fish, beans vegetables, and some fruits.  He is s/p TEE and cardioversion this morning.  Pt reports a UBW of ~160 pounds.  Anthropometrics:Height: 6' 4" (193 cm),  Weight: 111.1 kg (245 lb),  , Body mass index is 29.82 kg/m??. BMI class of overweight for age <65 years.  WT / BMI 11/05/2017 10/23/2017 10/08/2017 09/14/2017   WEIGHT 245 lb  252 lb 9.6 oz 259 lb   Per weights listed in EMR, potential for a 7 pound, 2.7% weight loss within 1 month.    Macronutrient needs:  EER:  2222-2778 kcal /day (20-25 kcal/kg listed BW)  EPR:  89-111 grams protein/day (0.8-1 grams/kg listed BW)  Intake/Comparative Standards: No recorded meal intakes.    Nutrition Diagnosis: Unintended weight loss r/t decreased energy intake, as evidenced by pt with report of early satiety and weight loss noted above.    Intervention:  Meals and snacks: Continue current diet. Collected lunch order.  Add preferences. Add double protein portions.  Nutrition Supplement Therapy: none at this time.    Discharge nutrition plan: Too soon to determine.  Coordination of Nutrition Care: Jenny RN and catering associate.    Brooke Simpson, MS, RD, LD, CNSC  [phone]

## 2017-11-05 NOTE — Progress Notes (Signed)
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

## 2017-11-05 NOTE — Progress Notes (Signed)
TRANSFER - OUT REPORT:    Verbal report given to Jenni,RN(name) on [name] [name]  being transferred to telemetry(unit) for routine progression of care       Report consisted of patient???s Situation, Background, Assessment and   Recommendations(SBAR).     Information from the following report(s) Kardex, Procedure Summary and Cardiac Rhythm atrial fib was reviewed with the receiving nurse.    Lines:   Peripheral IV 11/05/17 Right Antecubital (Active)        Opportunity for questions and clarification was provided.      Patient transported with:   Monitor  Registered Nurse

## 2017-11-05 NOTE — Progress Notes (Signed)
Last dose of Xarelto on 11/04/17 at 1200 noon.

## 2017-11-05 NOTE — Progress Notes (Signed)
Warfarin dosing per pharmacist    [name] [name] is a 62 y.o. male.    Height: 6' 4" (193 cm)    Weight: 111.1 kg (245 lb)    Indication:  afib    Goal INR:  2-3    Home dose:  New start    Risk factors or significant drug interactions:  plavix may increase bleeding potential    Other anticoagulants:  Heparin drip    Daily Monitoring  Date  INR     Warfarin dose HGB              Notes  7/22  1.1  7.5 mg  13.6        Pharmacy to assist in dosing warfarin. New start; will initiate at 7.5mg daily given weight > 100kg. Daily INR ordered. Pharmacy will continue to monitor and adjust as needed.      Thank you,  Mark Elizalde PharmD  [phone]

## 2017-11-05 NOTE — Procedures (Signed)
Brief Cardiac Procedure Note    Patient: [name] [name] MRN: [id number]  SSN: [id number]    Date of Birth: 11/23/1955  Age: 62 y.o.  Sex: male      Date of Procedure: 11/05/2017     Pre-procedure Diagnosis: Atrial Fibrillation/Atrial Flutter    Post-procedure Diagnosis: AFIB RVR  LA thrombus  ischemic CM     Reason for Procedure: Cardiac Arrhythmia    Procedure: Transesophageal Echocardiogram    Brief Description of Procedure: TEE standard with Definity      Performed By: Aylene Acoff E Urvi Imes, MD     Assistants: RN    Anesthesia: Moderate Sedation    Estimated Blood Loss: Less than 10 mL      Specimens: None    Implants: None    Findings: LV with anterior apical akinesis   EF 25%   LA LV smoke  LA thrombus confirmed by definity     Complications: None    Recommendations: admit for afib control CM management .    Signed By: Townsend Cudworth E Sacora Hawbaker, MD     November 05, 2017

## 2017-11-05 NOTE — Progress Notes (Signed)
TRANSFER - IN REPORT:    Verbal report received from Allison Rochussen, RN on [name] [name]  being received from [facility] for routine progression of care.    Report consisted of patient???s Situation, Background, Assessment and   Recommendations(SBAR).     Information from the following reports was reviewed: Kardex, Procedure Summary, MAR and Recent Results.    Opportunity for questions and clarification was provided.      Assessment completed upon patient???s arrival to unit and care assumed.     Patient received to room 310 and assessment completed. Patient connected to telemetry monitor and oriented to the room.  Pt had TEE and is unable to drink for another 30-45 minutes.      Pt stated that he is missing his glasses.  RN called recovery who stated that his glasses are not there and that maybe his wife has them.  Will follow up with pt wife

## 2017-11-06 LAB — PTT
aPTT: 101.5 s — ABNORMAL HIGH (ref 24.7–39.8)
aPTT: 64.3 s — ABNORMAL HIGH (ref 24.7–39.8)

## 2017-11-06 LAB — PROTHROMBIN TIME + INR
INR: 1.2
Prothrombin time: 14.6 s — ABNORMAL HIGH (ref 11.7–14.5)

## 2017-11-06 LAB — APTT
aPTT: 101.5 s — ABNORMAL HIGH (ref 24.7–39.8)
aPTT: 64.3 s — ABNORMAL HIGH (ref 24.7–39.8)

## 2017-11-06 LAB — PROTIME-INR
INR: 1.2
Protime: 14.6 s — ABNORMAL HIGH (ref 11.7–14.5)

## 2017-11-06 MED ORDER — DIPHENHYDRAMINE 25 MG CAP
25 mg | Freq: Every evening | ORAL | Status: DC | PRN
Start: 2017-11-06 — End: 2017-11-07
  Administered 2017-11-06: 03:00:00 via ORAL

## 2017-11-06 MED ORDER — APIXABAN 5 MG TABLET
5 mg | Freq: Two times a day (BID) | ORAL | Status: DC
Start: 2017-11-06 — End: 2017-11-07
  Administered 2017-11-06 – 2017-11-07 (×3): via ORAL

## 2017-11-06 MED ORDER — METOPROLOL SUCCINATE SR 100 MG 24 HR TAB
100 mg | Freq: Two times a day (BID) | ORAL | Status: DC
Start: 2017-11-06 — End: 2017-11-07
  Administered 2017-11-06 – 2017-11-07 (×3): via ORAL

## 2017-11-06 MED ORDER — HEPARIN (PORCINE) 5,000 UNIT/ML IJ SOLN
5000 unit/mL | Freq: Once | INTRAMUSCULAR | Status: AC
Start: 2017-11-06 — End: 2017-11-06
  Administered 2017-11-06: 14:00:00 via INTRAVENOUS

## 2017-11-06 MED FILL — CLOPIDOGREL 75 MG TAB: 75 mg | ORAL | Qty: 1

## 2017-11-06 MED FILL — METOPROLOL SUCCINATE SR 100 MG 24 HR TAB: 100 mg | ORAL | Qty: 1

## 2017-11-06 MED FILL — POTASSIUM CHLORIDE SR 10 MEQ TAB, PARTICLES/CRYSTALS: 10 mEq | ORAL | Qty: 1

## 2017-11-06 MED FILL — HEPARIN (PORCINE) IN D5W 25,000 UNIT/500 ML IV: 25000 unit/500 mL (50 unit/mL) | INTRAVENOUS | Qty: 500

## 2017-11-06 MED FILL — HEPARIN (PORCINE) 5,000 UNIT/ML IJ SOLN: 5000 unit/mL | INTRAMUSCULAR | Qty: 1

## 2017-11-06 MED FILL — DIPHENHYDRAMINE 25 MG CAP: 25 mg | ORAL | Qty: 1

## 2017-11-06 MED FILL — FUROSEMIDE 40 MG TAB: 40 mg | ORAL | Qty: 1

## 2017-11-06 MED FILL — LOSARTAN 25 MG TAB: 25 mg | ORAL | Qty: 1

## 2017-11-06 MED FILL — EZETIMIBE 10 MG TAB: 10 mg | ORAL | Qty: 1

## 2017-11-06 MED FILL — ELIQUIS 5 MG TABLET: 5 mg | ORAL | Qty: 1

## 2017-11-06 NOTE — Progress Notes (Signed)
HR 90-120s. VS stable     Problem: Afib Pathway: Day 2  Goal: *Hemodynamically stable  Outcome: Progressing Towards Goal       Problem: Falls - Risk of  Goal: *Absence of Falls  Description  Document Bridgette HabermannSchmid Fall Risk and appropriate interventions in the flowsheet.  Outcome: Progressing Towards Goal  Note:   Fall Risk Interventions:  Mobility Interventions: Communicate number of staff needed for ambulation/transfer, Patient to call before getting OOB         Medication Interventions: Patient to call before getting OOB, Teach patient to arise slowly

## 2017-11-06 NOTE — Progress Notes (Signed)
Verbal bedside report given to [facility] CenterKelli, Public relations account executiveoncoming RN. Patient's situation, background, assessment and recommendations provided. Opportunity for questions provided. Oncoming RN assumed care of patient. Heparin IV drip verified at bedside and in chart with oncoming RN. Dr. Devona KonigSenfield in room to discuss plan of care with pt. Pt stated no need

## 2017-11-06 NOTE — Progress Notes (Signed)
 Warfarin dosing per pharmacist    [name] [name] is a 62 y.o. male.    Height: 6' 4 (193 cm)    Weight: 111.9 kg (246 lb 11.2 oz)    Indication:  afib    Goal INR:  2-3    Home dose:  New start    Risk factors or significant drug interactions:  plavix  may increase bleeding potential    Other anticoagulants:  Heparin drip    Daily Monitoring  Date  INR     Warfarin dose HGB              Notes  7/22  1.1  7.5 mg  13.6    7/23  1.2  7.5 mg  ----      Pharmacy to assist in dosing warfarin. Started 7.5mg  daily given weight > 100kg.     Continue warfarin 7.5mg  daily.    Daily INR ordered. Pharmacy will continue to monitor and adjust as needed.      Thank you,  Duwaine Boers, PharmD

## 2017-11-06 NOTE — Progress Notes (Signed)
Problem: Afib Pathway: Day 3  Goal: Activity/Safety  Outcome: Progressing Towards Goal  Goal: Medications  Outcome: Progressing Towards Goal  Goal: Respiratory  Outcome: Progressing Towards Goal     Problem: Falls - Risk of  Goal: *Absence of Falls  Description  Document Bridgette HabermannSchmid Fall Risk and appropriate interventions in the flowsheet.  Outcome: Progressing Towards Goal  Note:   Fall Risk Interventions:  Mobility Interventions: Communicate number of staff needed for ambulation/transfer, Patient to call before getting OOB         Medication Interventions: Evaluate medications/consider consulting pharmacy, Patient to call before getting OOB                   Problem: Nutrition Deficit  Goal: *Optimize nutritional status  Outcome: Progressing Towards Goal

## 2017-11-06 NOTE — Progress Notes (Signed)
Verbal bedside report received from Susa GriffinsKelli Simmons, RN. Assumed care of patient. Heparin stopped IV drip verified at bedside with outgoing RN.     Eliquis given by off going RN.

## 2017-11-06 NOTE — Progress Notes (Signed)
HR 90-120s. VS stable     Problem: Afib Pathway: Day 2  Goal: *Hemodynamically stable  Outcome: Progressing Towards Goal       Problem: Falls - Risk of  Goal: *Absence of Falls  Description  Document Schmid Fall Risk and appropriate interventions in the flowsheet.  Outcome: Progressing Towards Goal  Note:   Fall Risk Interventions:  Mobility Interventions: Communicate number of staff needed for ambulation/transfer, Patient to call before getting OOB         Medication Interventions: Patient to call before getting OOB, Teach patient to arise slowly

## 2017-11-06 NOTE — Progress Notes (Signed)
Verbal bedside report given to Kelli, oncoming RN. Patient's situation, background, assessment and recommendations provided. Opportunity for questions provided. Oncoming RN assumed care of patient. Heparin IV drip verified at bedside and in chart with oncoming RN. Dr. Senfield in room to discuss plan of care with pt. Pt stated no need

## 2017-11-06 NOTE — Progress Notes (Signed)
Verbal bedside report received from Kelli Simmons, RN. Assumed care of patient. Heparin stopped IV drip verified at bedside with outgoing RN.     Eliquis given by off going RN.

## 2017-11-06 NOTE — Progress Notes (Deleted)
TRANSFER - IN REPORT:    Verbal report received from Remi, RN on Buckner MaltaJames C Lapre being received from ER for routine progression of care      Report consisted of patient???s Situation, Background, Assessment and Recommendations(SBAR).     Information from the following report(s) Kardex, ED Summary, MAR, Recent Results and Cardiac Rhythm SR/sinus tachycardia was reviewed with the receiving nurse.    Opportunity for questions and clarification was provided.      Assessment completed upon patient???s arrival to unit and care assumed. Pt attached to telemetry monitor with rhythm of sinus tachycardia. Heparin gtt verified with outgoing RN in chart and at bedside. Pt oriented to the room, and educated on the use of call light for ambulation. Pt on NC 2L. Pt denied chest pain, SOB, distress.

## 2017-11-06 NOTE — Progress Notes (Signed)
Warfarin dosing per pharmacist    [name] [name] is a 62 y.o. male.    Height: 6' 4" (193 cm)    Weight: 111.9 kg (246 lb 11.2 oz)    Indication:  afib    Goal INR:  2-3    Home dose:  New start    Risk factors or significant drug interactions:  plavix may increase bleeding potential    Other anticoagulants:  Heparin drip    Daily Monitoring  Date  INR     Warfarin dose HGB              Notes  7/22  1.1  7.5 mg  13.6    7/23  1.2  7.5 mg  ----      Pharmacy to assist in dosing warfarin. Started 7.5mg daily given weight > 100kg.     Continue warfarin 7.5mg daily.    Daily INR ordered. Pharmacy will continue to monitor and adjust as needed.      Thank you,  Tucker Minter, PharmD

## 2017-11-06 NOTE — Progress Notes (Signed)
Problem: Afib Pathway: Day 3  Goal: Activity/Safety  Outcome: Progressing Towards Goal  Goal: Medications  Outcome: Progressing Towards Goal  Goal: Respiratory  Outcome: Progressing Towards Goal     Problem: Falls - Risk of  Goal: *Absence of Falls  Description  Document Schmid Fall Risk and appropriate interventions in the flowsheet.  Outcome: Progressing Towards Goal  Note:   Fall Risk Interventions:  Mobility Interventions: Communicate number of staff needed for ambulation/transfer, Patient to call before getting OOB         Medication Interventions: Evaluate medications/consider consulting pharmacy, Patient to call before getting OOB                   Problem: Nutrition Deficit  Goal: *Optimize nutritional status  Outcome: Progressing Towards Goal

## 2017-11-07 LAB — PROTHROMBIN TIME + INR
INR: 1.2
Prothrombin time: 15.4 s — ABNORMAL HIGH (ref 11.7–14.5)

## 2017-11-07 LAB — PROTIME-INR
INR: 1.2
Protime: 15.4 s — ABNORMAL HIGH (ref 11.7–14.5)

## 2017-11-07 MED ORDER — LOSARTAN 25 MG TAB
25 mg | ORAL_TABLET | Freq: Every day | ORAL | 11 refills | Status: DC
Start: 2017-11-07 — End: 2018-10-25

## 2017-11-07 MED ORDER — NITROGLYCERIN 0.4 MG SUBLINGUAL TAB
0.4 mg | SUBLINGUAL | 5 refills | Status: AC | PRN
Start: 2017-11-07 — End: ?

## 2017-11-07 MED ORDER — ATORVASTATIN 10 MG TAB
10 mg | ORAL_TABLET | Freq: Every day | ORAL | 11 refills | Status: DC
Start: 2017-11-07 — End: 2018-01-18

## 2017-11-07 MED ORDER — METOPROLOL SUCCINATE SR 100 MG 24 HR TAB
100 mg | ORAL_TABLET | Freq: Two times a day (BID) | ORAL | 11 refills | Status: DC
Start: 2017-11-07 — End: 2018-11-18

## 2017-11-07 MED ORDER — POTASSIUM CHLORIDE SR 10 MEQ TAB, PARTICLES/CRYSTALS
10 mEq | ORAL_TABLET | Freq: Two times a day (BID) | ORAL | 6 refills | Status: DC
Start: 2017-11-07 — End: 2019-05-27

## 2017-11-07 MED ORDER — DILTIAZEM ER 120 MG 24 HR CAP
120 mg | ORAL_CAPSULE | Freq: Every day | ORAL | 11 refills | Status: DC
Start: 2017-11-07 — End: 2018-01-18

## 2017-11-07 MED ORDER — CLOPIDOGREL 75 MG TAB
75 mg | ORAL_TABLET | Freq: Every day | ORAL | 11 refills | Status: DC
Start: 2017-11-07 — End: 2018-10-25

## 2017-11-07 MED ORDER — FUROSEMIDE 40 MG TAB
40 mg | ORAL_TABLET | Freq: Every day | ORAL | 11 refills | Status: DC
Start: 2017-11-07 — End: 2019-05-27

## 2017-11-07 MED ORDER — APIXABAN 5 MG TABLET
5 mg | ORAL_TABLET | Freq: Two times a day (BID) | ORAL | 11 refills | Status: DC
Start: 2017-11-07 — End: 2019-05-27

## 2017-11-07 MED ORDER — DILTIAZEM 30 MG IR TAB
30 mg | Freq: Four times a day (QID) | ORAL | Status: DC
Start: 2017-11-07 — End: 2017-11-07
  Administered 2017-11-07 (×2): via ORAL

## 2017-11-07 MED FILL — CLOPIDOGREL 75 MG TAB: 75 mg | ORAL | Qty: 1

## 2017-11-07 MED FILL — ELIQUIS 5 MG TABLET: 5 mg | ORAL | Qty: 1

## 2017-11-07 MED FILL — EZETIMIBE 10 MG TAB: 10 mg | ORAL | Qty: 1

## 2017-11-07 MED FILL — FUROSEMIDE 40 MG TAB: 40 mg | ORAL | Qty: 1

## 2017-11-07 MED FILL — POTASSIUM CHLORIDE SR 10 MEQ TAB, PARTICLES/CRYSTALS: 10 mEq | ORAL | Qty: 1

## 2017-11-07 MED FILL — LOSARTAN 25 MG TAB: 25 mg | ORAL | Qty: 1

## 2017-11-07 MED FILL — DILTIAZEM 30 MG IR TAB: 30 mg | ORAL | Qty: 1

## 2017-11-07 MED FILL — METOPROLOL SUCCINATE SR 100 MG 24 HR TAB: 100 mg | ORAL | Qty: 1

## 2017-11-07 NOTE — Progress Notes (Signed)
I have reviewed discharge instructions with the patient and spouse.  The patient and spouse verbalized understanding. Patient has on Lifevest and heart monitor returned to monitor room. IV removed.

## 2017-11-07 NOTE — Progress Notes (Signed)
Verbal bedside report given to Meade Maw, oncoming RN. Patient's situation, background, assessment and recommendations provided. Opportunity for questions provided. Oncoming RN assumed care of patient.

## 2017-11-07 NOTE — Progress Notes (Signed)
The chart is being review including labs, recent test, and H and P for the purpose of qualification of Life vest.  The last echocardiogram is included below.      Indications: Primary prevention (EF =35% and MI, NICM, or other DCM)    Last Echo:  SEE TEE note    EF 25%    Treasa SchoolStelina D Marilyn Wing, NP

## 2017-11-07 NOTE — Progress Notes (Signed)
Progress Notes by Jackqulyn Livings, MD at 11/07/17 [phone]                Author: Jackqulyn Livings, MD  Service: Cardiology  Author Type: Physician       Filed: 11/07/17 0920  Date of Service: 11/07/17 0645  Status: Signed          Editor: Jackqulyn Livings, MD (Physician)                                          [facility] PROGRESS NOTE              11/07/2017 6:45 AM      Admit Date: 11/05/2017      Admit Diagnosis: A-fib (HCC) [I48.91];Atrial fibrillation with RVR (HCC) [I48.91]           Subjective:     No complaints this AM, no chest pain or shortness of breath      Interval History: (History of pertinent interval events obtained from nursing staff)      ROS:   GEN:  No fever or chills   Cardiovascular:  As noted above   Pulmonary:  As noted above   Neuro:  No new focal motor or sensory loss           Objective:          Vitals:             11/06/17 1919  11/06/17 2131  11/07/17 0126  11/07/17 0547           BP:  128/81  (!) 135/98  (!) 133/98  (!) 133/98     Pulse:  (!) 110  98  87  82     Resp:  20  20  18  18      Temp:  95.4 ??F (35.2 ??C)  97.4 ??F (36.3 ??C)  97.8 ??F (36.6 ??C)  97.9 ??F (36.6 ??C)     SpO2:  95%  98%  99%  98%     Weight:        107 kg (235 lb 14.4 oz)           Height:                   Physical Exam:   General-Well Developed, Well Nourished, No Acute Distress, Alert & Oriented x 3, appropriate mood.   Neck- supple, no JVD   CV- irreg irreg, no MRG   Lung- clear bilaterally   Abd- soft, nontender, nondistended   Ext- no edema bilaterally.   Skin- warm and dry        Current Facility-Administered Medications          Medication  Dose  Route  Frequency           ?  metoprolol succinate (TOPROL-XL) tablet 100 mg   100 mg  Oral  BID     ?  apixaban (ELIQUIS) tablet 5 mg   5 mg  Oral  BID     ?  lidocaine (XYLOCAINE) 2 % viscous solution 15 mL   15 mL  Mouth/Throat  PRN     ?  furosemide (LASIX) tablet 40 mg   40 mg  Oral  DAILY     ?  potassium chloride (KLOR-CON) tablet 10 mEq   10  mEq  Oral  BID     ?  losartan (COZAAR) tablet 25 mg   25 mg  Oral  DAILY     ?  digoxin (LANOXIN) tablet 0.25 mg   0.25 mg  Oral  DAILY     ?  temazepam (RESTORIL) capsule 15 mg   15 mg  Oral  QHS PRN     ?  nitroglycerin (NITROSTAT) tablet 0.4 mg   0.4 mg  SubLINGual  PRN     ?  ezetimibe (ZETIA) tablet 10 mg   10 mg  Oral  DAILY     ?  atorvastatin (LIPITOR) tablet 10 mg   10 mg  Oral  DAILY     ?  clopidogrel (PLAVIX) tablet 75 mg   75 mg  Oral  DAILY           ?  diphenhydrAMINE (BENADRYL) capsule 25 mg   25 mg  Oral  QHS PRN        Data Review:      Recent Results (from the past 24 hour(s))     PTT          Collection Time: 11/06/17  8:52 AM         Result  Value  Ref Range            aPTT  64.3 (H)  24.7 - 39.8 SEC       PROTHROMBIN TIME + INR          Collection Time: 11/07/17  4:14 AM         Result  Value  Ref Range            Prothrombin time  15.4 (H)  11.7 - 14.5 sec            INR  1.2              EKG:  (EKG has been independently visualized by me with interpretation below)     Assessment:        Active Problems:     Anterior myocardial infarction (HCC) (12/06/2009)        Hypertension (02/02/2010)        CAD (coronary artery disease) (02/02/2010)        Atrial fibrillation with RVR (HCC) (11/05/2017)        Thrombus of left atrial appendage (11/05/2017)           Plan:       Atrial fibrillation with RVR -- Toprol XL increased to 100 mg BID, improved rates today, needs aggressive afib management when able given severe cardiomyopathy   ??     Hypertension -- continue current medications. Monitor BP closely. Titrate medications as needed.    ??     CAD (coronary artery disease) -- anterior MI. Likely cause of TEE findings. Continue Plavix, BB, ARB and statin (low-dose)   ??     Thrombus of left atrial appendage -- transitioned to eliquis by Dr. Devona KonigSenfield, will need repeat TEE imaging after several weeks of anticoagulation   ??     Ischemic cardiomyopathy -- EF 25% by recent TEE, recommended Life Vest bridge, Pt  is self pay, will have social work look into options      Molli HazardMatthew B. Alahna Dunne MD   Cardiology/Electrophysiology

## 2017-11-07 NOTE — Progress Notes (Signed)
LMSW follow up visit with pt and spouse.  Pt has been recommended for a Technical sales engineerLife Vest.  [facility]  If pt needs the Life vest after this [employer] (the company that provides the vest) will continue to sponsor pt if vest continues to be ordered by MD.  Pt is agreeable to move forward with the referral.  SF sponsorship for Life Vest approved by Gaspar ColaBrenda Mays CM Admin.  Referral faxed to [employer] with request that pt be fitted in anticipation of possible discharge home tomorrow.      [employer] has seen pt and he is applying for hospital sponsorship.

## 2017-11-07 NOTE — Progress Notes (Signed)
Patient has been fitted for the Lifevest. The Lifevest is at bedside until discharge and at that time patient will put vest on.

## 2017-11-07 NOTE — Progress Notes (Signed)
Bedside and verbal report received from Kiara RN.

## 2017-11-07 NOTE — Progress Notes (Signed)
Verbal bedside report given to Kelli S, oncoming RN. Patient's situation, background, assessment and recommendations provided. Opportunity for questions provided. Oncoming RN assumed care of patient.

## 2017-11-07 NOTE — Progress Notes (Signed)
LMSW follow up visit with pt and spouse.  Pt has been recommended for a Life Vest.  Educated couple that [employer] will sponsor vest for 1 month.  If pt needs the Life vest after this [employer] (the company that provides the vest) will continue to sponsor pt if vest continues to be ordered by MD.  Pt is agreeable to move forward with the referral.  [employer] sponsorship for Life Vest approved by Brenda Mays CM Admin.  Referral faxed to [employer] with request that pt be fitted in anticipation of possible discharge home tomorrow.      [employer] has seen pt and he is applying for hospital sponsorship.

## 2017-11-07 NOTE — Progress Notes (Signed)
[facility] PROGRESS NOTE           11/07/2017 6:45 AM    Admit Date: 11/05/2017    Admit Diagnosis: A-fib (HCC) [I48.91];Atrial fibrillation with RVR (HCC) [I48.91]      Subjective:   No complaints this AM, no chest pain or shortness of breath    Interval History: (History of pertinent interval events obtained from nursing staff)    ROS:  GEN:  No fever or chills  Cardiovascular:  As noted above  Pulmonary:  As noted above  Neuro:  No new focal motor or sensory loss      Objective:     Vitals:    11/06/17 1919 11/06/17 2131 11/07/17 0126 11/07/17 0547   BP: 128/81 (!) 135/98 (!) 133/98 (!) 133/98   Pulse: (!) 110 98 87 82   Resp: 20 20 18 18    Temp: 95.4 ??F (35.2 ??C) 97.4 ??F (36.3 ??C) 97.8 ??F (36.6 ??C) 97.9 ??F (36.6 ??C)   SpO2: 95% 98% 99% 98%   Weight:    107 kg (235 lb 14.4 oz)   Height:           Physical Exam:  General-Well Developed, Well Nourished, No Acute Distress, Alert & Oriented x 3, appropriate mood.  Neck- supple, no JVD  CV- irreg irreg, no MRG  Lung- clear bilaterally  Abd- soft, nontender, nondistended  Ext- no edema bilaterally.  Skin- warm and dry    Current Facility-Administered Medications   Medication Dose Route Frequency   ??? metoprolol succinate (TOPROL-XL) tablet 100 mg  100 mg Oral BID   ??? apixaban (ELIQUIS) tablet 5 mg  5 mg Oral BID   ??? lidocaine (XYLOCAINE) 2 % viscous solution 15 mL  15 mL Mouth/Throat PRN   ??? furosemide (LASIX) tablet 40 mg  40 mg Oral DAILY   ??? potassium chloride (KLOR-CON) tablet 10 mEq  10 mEq Oral BID   ??? losartan (COZAAR) tablet 25 mg  25 mg Oral DAILY   ??? digoxin (LANOXIN) tablet 0.25 mg  0.25 mg Oral DAILY   ??? temazepam (RESTORIL) capsule 15 mg  15 mg Oral QHS PRN   ??? nitroglycerin (NITROSTAT) tablet 0.4 mg  0.4 mg SubLINGual PRN   ??? ezetimibe (ZETIA) tablet 10 mg  10 mg Oral DAILY   ??? atorvastatin (LIPITOR) tablet 10 mg  10 mg Oral DAILY   ??? clopidogrel (PLAVIX) tablet 75 mg  75 mg Oral DAILY    ??? diphenhydrAMINE (BENADRYL) capsule 25 mg  25 mg Oral QHS PRN     Data Review:   Recent Results (from the past 24 hour(s))   PTT    Collection Time: 11/06/17  8:52 AM   Result Value Ref Range    aPTT 64.3 (H) 24.7 - 39.8 SEC   PROTHROMBIN TIME + INR    Collection Time: 11/07/17  4:14 AM   Result Value Ref Range    Prothrombin time 15.4 (H) 11.7 - 14.5 sec    INR 1.2         EKG:  (EKG has been independently visualized by me with interpretation below)  Assessment:     Active Problems:    Anterior myocardial infarction (HCC) (12/06/2009)      Hypertension (02/02/2010)      CAD (coronary artery disease) (02/02/2010)      Atrial fibrillation with RVR (HCC) (11/05/2017)      Thrombus of left atrial appendage (11/05/2017)      Plan:  Atrial fibrillation with RVR -- Toprol XL increased to 100 mg BID, improved rates today, needs aggressive afib management when able given severe cardiomyopathy  ??    Hypertension -- continue current medications. Monitor BP closely. Titrate medications as needed.   ??    CAD (coronary artery disease) -- anterior MI. Likely cause of TEE findings. Continue Plavix, BB, ARB and statin (low-dose)  ??    Thrombus of left atrial appendage -- transitioned to eliquis by Dr. Devona Konig, will need repeat TEE imaging after several weeks of anticoagulation  ??    Ischemic cardiomyopathy -- EF 25% by recent TEE, recommended Life Vest bridge, Pt is self pay, will have social work look into options    Molli Hazard B. Lucyle Alumbaugh MD  Cardiology/Electrophysiology

## 2017-11-07 NOTE — Progress Notes (Signed)
Patient has been fitted for the Lifevest. The Lifevest is at bedside until discharge and at that time patient will put vest on.

## 2017-11-07 NOTE — Progress Notes (Signed)
The chart is being review including labs, recent test, and H and P for the purpose of qualification of Life vest.  The last echocardiogram is included below.      Indications: Primary prevention (EF =35% and MI, NICM, or other DCM)    Last Echo:  SEE TEE note    EF 25%    Dameon Soltis D Crosley Stejskal, NP

## 2017-11-07 NOTE — Discharge Summary (Deleted)
[facility] Discharge Summary     Patient ID:  [name] [name]  [id number]  62 y.o.  [birth year]    Admit date: 11/05/2017    Discharge date:  11/07/2017    Admitting Physician: Seabron SpatesJohn E Cebe, MD     Discharge Physician: Treasa SchoolStelina D Dariane Natzke, NP/Dr. Sallyanne KusterSellers    Admission Diagnoses: A-fib [facility](HCC) [I48.91]  Atrial fibrillation with RVR [facility](HCC) [I48.91]    Discharge Diagnoses:    Diagnosis   ??? Anterior myocardial infarction [facility](HCC)       ??? Essential hypertension, benign       ??? Coronary atherosclerosis of native coronary artery       ??? Dyslipidemia       ??? Atrial fibrillation with RVR (HCC)   ??? Thrombus of left atrial appendage   ??? Hypertension   ??? CAD (coronary artery disease)       Cardiology Procedures this admission:  TEE  Consults: EP    Hospital Course: [facility]  TEE revealed EF 25% with LA thrombus.  Patient was admitted for further work up and rate control of a fib.  EP was consulted for reduced EF and atrial fibrillation with rvr.  EP felt maintenance of rhythm is ideal, however, given LA thrombus unable to pursue at this time.  He will need follow up TEE in 4-6 weeks and further therapies for AF will be discussed.  If EF does not recover, he will be considered for ICD.   Patient was fitted for a Life vest prior to discharge.  His HR was difficult to rate control.  Digoxin was added, but he refused to take it.  He was started on Cardizem PO, with improved rate.        Patient was seen and examined by Dr. Sallyanne KusterSellers and determined stable and ready for discharge. Patient was instructed on the importance of medication compliance, low sodium diet, 2 liter per day fluid restriction and daily weights.  For maximized medical therapy of congestive heart failure, patient will continue use of  BB and ARB.  The patient has been scheduled to see Dr. Devona KonigSenfield in 1-2 weeks.     DISPOSITION: The patient is being discharged home in stable condition on a  low saturated fat, low cholesterol and low salt diet. The patient is instructed to advance activities as tolerated to the limit of fatigue or shortness of breath.  The patient is informed to monitor daily weights and maintain a 2 liter per day fluid restriction.  The patient is instructed to call the office for any shortness of breath, weight gain, or increased peripheral edema.        Discharge Exam:   Visit Vitals  BP (!) 134/95 (BP 1 Location: Right arm, BP Patient Position: At rest)   Pulse 98   Temp 97.8 ??F (36.6 ??C)   Resp 17   Ht 6\' 4"  (1.93 m)   Wt 107 kg (235 lb 14.4 oz)   SpO2 93%   BMI 28.71 kg/m??     Patient has been seen by Dr. Sallyanne KusterSellers: see his progress note for exam details.    Recent Results (from the past 24 hour(s))   PTT    Collection Time: 11/06/17  8:52 AM   Result Value Ref Range    aPTT 64.3 (H) 24.7 - 39.8 SEC   PROTHROMBIN TIME + INR    Collection Time: 11/07/17  4:14 AM   Result Value Ref Range    Prothrombin  time 15.4 (H) 11.7 - 14.5 sec    INR 1.2           Patient Instructions:       Current Discharge Medication List      START taking these medications    Details   atorvastatin (LIPITOR) 10 mg tablet Take 1 Tab by mouth daily.  Qty: 30 Tab, Refills: 11      losartan (COZAAR) 25 mg tablet Take 1 Tab by mouth daily.  Qty: 30 Tab, Refills: 11      apixaban (ELIQUIS) 5 mg tablet Take 1 Tab by mouth two (2) times a day.  Qty: 60 Tab, Refills: 11      clopidogrel (PLAVIX) 75 mg tab Take 1 Tab by mouth daily.  Qty: 30 Tab, Refills: 11      potassium chloride (KLOR-CON) 10 mEq tablet Take 1 Tab by mouth two (2) times a day.  Qty: 60 Tab, Refills: 6      nitroglycerin (NITROSTAT) 0.4 mg SL tablet 1 Tab by SubLINGual route as needed for Chest Pain. Up to 3 doses.  Qty: 1 Bottle, Refills: 5      dilTIAZem CD (CARDIZEM CD) 120 mg ER capsule Take 1 Cap by mouth daily.  Qty: 30 Cap, Refills: 11         CONTINUE these medications which have CHANGED    Details    metoprolol succinate (TOPROL-XL) 100 mg tablet Take 1 Tab by mouth two (2) times a day.  Qty: 60 Tab, Refills: 11      furosemide (LASIX) 40 mg tablet Take 1 Tab by mouth daily.  Qty: 30 Tab, Refills: 11    Associated Diagnoses: Localized edema         CONTINUE these medications which have NOT CHANGED    Details   loratadine (CLARITIN) 10 mg tablet Take 10 mg by mouth daily.      ezetimibe (ZETIA) 10 mg tablet Take 10 mg by mouth daily.         STOP taking these medications       rivaroxaban (XARELTO) 20 mg tab tablet Comments:   Reason for Stopping:         lisinopril (PRINIVIL, ZESTRIL) 20 mg tablet Comments:   Reason for Stopping:         rivaroxaban (XARELTO) 20 mg tab tablet Comments:   Reason for Stopping:                 Signed:  Treasa School, NP  11/07/2017 8:18 AM

## 2017-11-07 NOTE — Progress Notes (Signed)
I have reviewed discharge instructions with the patient and spouse.  The patient and spouse verbalized understanding. Patient has on Lifevest and heart monitor returned to monitor room. IV removed.

## 2017-11-12 ENCOUNTER — Encounter: Payer: Charity | Primary: Family Medicine

## 2017-11-19 ENCOUNTER — Ambulatory Visit: Attending: Cardiovascular Disease | Primary: Adult Health

## 2017-11-19 ENCOUNTER — Ambulatory Visit
Admit: 2017-11-19 | Discharge: 2017-11-19 | Payer: Charity | Attending: Cardiovascular Disease | Primary: Family Medicine

## 2017-11-19 DIAGNOSIS — I200 Unstable angina: Secondary | ICD-10-CM

## 2017-11-19 NOTE — Progress Notes (Signed)
Progress Notes by Rhett Bannister, MD at 11/19/17 1445                Author: Rhett Bannister, MD  Service: --  Author Type: Physician       Filed: 11/19/17 1544  Encounter Date: 11/19/2017  Status: Signed          Editor: Rhett Bannister, MD (Physician)                          [facility]   [address], [address]   [place], [place] [address]   PHONE: [phone]            11/19/17            NAME:  [name] [name]   DOB: [birth year]   MRN: [id number]       Follow Up       ASSESSMENT and PLAN:   Diagnoses and all orders for this visit:      1. CAD       2. Thrombus of left atrial appendage      3. Essential hypertension      4. Atherosclerosis of native coronary artery of native heart with stable angina pectoris (HCC)      5. Atrial fibrillation with RVR (HCC)      6. Anterior myocardial infarction (HCC)      7. Dyslipidemia      8. Stroke 08/2514       62 year old male with persistent AF with finding of LAA thrombus and reduced EF. I suspect his EF is reduced, at least in part from his AF.  Maintenance of rhythm is ideal but we cannot do this at this time given his presence of LAA thrombus. Given the nature of warfarin and high risk of subtherapeutic dosing that comes with warfarin, we will place him on Eliquis for better coverage of his  anticoagulation. He will need a follow up TEE in the next couple of weeks. We further discussed the role of CASTLE-AF and CABANA trials and the reduction of mortality in those with AF with cardiomyopathy. He will be a good candidate for AF ablation. 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       -Continue Eliquis.    -Continue AV nodal blockers.   -Plan for TEE/DCCV.   -Downstream AF ablation and possible ICD.   -Continue wearing LifeVest for now.       We fully reviewed the risks and benefits of TEE/DCCV. Smalls risks of esphogeal injury were discussed in addition skin irritation/burns, very small risk of stroke/heart attack or  death and/or recurrent arrhythmia.       AF ABLATION EDUCATION (done today):   I discussed with the patient the pathophysiology of atrial fibrillation, including details about potential triggers from the pulmonary veins (PVs), as well as non-PV sites.  We also discussed that in certain patients (especially those with more persistent  AF) there is often atrial substrate (i.e. fibrosis, dilatation, etc.) that promotes more sustained AF. We also discussed the therapeutic options for treatment of atrial fibrillation, including:   --Rate control    --Rhythm control with antiarrhythmic drugs (AAD) and supplemental rate control   --Catheter ablation, including pulmonary vein isolation (PVI), with or without additional substrate modification, in attempt to maintain sinus rhythm long-term   --AV nodal ablation with a permanent pacemaker (ablate & pace).  I emphasized to the patient that all of these options require stroke prophylaxis with oral anticoagulation therapy (OAC) with  Coumadin or novel oral anticoagulant (NOACs), depending on risk factors. I explained that successful catheter ablation with  sufficient long-term follow-up documenting a lack of recurrent AF may allow for discontinuation of anticoagulation in the future; however, this has not been proven safe in prospective clinical studies and is still considered experimental.  Data from retrospective  trials, however, has suggested that stopping oral anticoagulation after successful ablation does not result in increased in stroke rates and appears to be safe.      The benefits and risks of each of these approaches were outlined in detail.  We discussed the variable success rates of AF ablation, which can range from 50-80+%, depending on multiple factors, including paroxysmal vs. persistent AF, duration of AF, AF  burden, left atrial size and remodeling, concomitant valvular or other structural heart disease, non-PV triggers, prior ablation lesion sets,  obstructive sleep apnea, and other potential confounding factors.  I also explained that often (approximately  30-50% of the time) patients will require multiple procedures to achieve long-term AF suppression.  Additionally, we discussed that ablation may decrease AF burden and/or symptoms, but additional therapeutic strategies (i.e. concomitant AAD therapy, rate  controlling medications, oral anticoagulants, etc.) may be needed long term for AF management.      The catheter ablation procedure was described to the patient in detail, including the risks of recurrent AF/AT, need for repeat ablation, esophageal perforation/fistula, pulmonary vein stenosis,  bronchial injury/fistula, stroke, cardiac perforation with  the need for catheter drainage or surgical repair, vascular damage, DVT/PE, bleeding, thermal skin burns, radiation skin injury, kidney failure, pneumo/hemothorax, need for permanent pacemaker, phrenic or vagus nerve damage resulting in diaphragmatic  paralysis or gastroparesis, stiff left atrial syndrome, and even death.        We also discussed in detail today the options for anticoagulation for AF , including the periprocedure period.  Published data (the ARISTOTLE trial, NEJM 2011) suggests that apixiban is non-inferior  to warfarin for stroke prevention in AF, and appears  to  be safer with reductions in bleeding and overall mortality.    Also, using apixiban will allow us to avoid routine lab monitoring as well as many drug-drug and food-drug interactions. Additionally, using apixiban will allow us to avoid using LMWH  periprocedurally, which may reduce bleeding and hematomas.  The patient is aware of these risks/benefits and wishes to proceed with using apixiban periprocedurally.      Patient has been instructed and agrees to call our office with any issues or other concerns related to their cardiac condition(s) and/or complaint(s).      Thank you for allowing me to participate in the  electrophysiologic care of Mr. [name] [name]. Please contact me if any questions or concerns were to arise.      Harriet ButteJeffrey J. Lamarkus Nebel MD, MS   Clinical Cardiac Electrophysiology   [facility]   11/19/17   3:16 PM      ===================================================================   Chief Complant:       Chief Complaint       Patient presents with        ?  Irregular Heart Beat             hospital follow up            Consultation is requested by Rhett BannisterJeffrey J Thomasena Vandenheuvel for  evaluation of Irregular Heart Beat (hospital follow  up)         History:   [name] [name] is a most pleasant 62 y.o.  male with a past medical and cardiac history significant for anterior MI, CVA (May 2019, received TPA) , HTN, CAD, dyslipidemia, AFIB and HTN  who underwent TEE on 11/05/17. He was found to have a reduced EF of 25% with anterior apical akinesis. He was found to have an LA thrombus that was confirmed with definity. Patient was admitted and started on IV heparin with bridge to warfarin.       He comes in for followup. He was discharged on Eliquis. He's been taking. He was only taking Xarelto 10 mg po daily when he was found to have a thrombus in his LAA. He is wearing a LifeVest. He denies shocks. He remains in AF. The patient otherwise denies  chest pain, dyspnea, presyncope, syncope or lateralizing symptoms.      Cardiac PMH: (Old records have been reviewed and summarized below)      EKG:  (EKG has been independently visualized by me with interpretation below): Atrial fibrillation with  mild tachycardia.       ECHO: 11/05/2017   - ??Left ventricle: The ventricle was dilated. Systolic function was markedly  reduced. Ejection fraction was estimated in the range of 15 % to 20 %.     - ??Right ventricle: Systolic function was reduced.    - ??Left atrium: The atrium was dilated.  - ??Left atrial appendage: There was continuous spontaneous echo contrast   ("smoke") in the appendage. There was thrombus noted in the LAA. Due to  thrombus  cardioversion was not performed.    - ??Atrial septum: Appeared to be a PFO by color doppler.     - ??Right atrium: The atrium was dilated.    - ??Mitral valve: There was mild regurgitation.    - ??Tricuspid valve: There was mild regurgitation.    - ??Aorta, systemic arteries: There was mild atheroma.      Previous Heart Catheterization: 09/2010   CORONARY ARTERIOGRAMS   1. Both the right and left coronaries were large and patulous   proximally.   2. The left main coronary was quite large, was huge, and then tapered to   the LAD, circumflex systems.   3. The LAD coronary had evidence of proximal stenting which emanated from   the left main. The stented segment appeared widely patent with a good   inflow and outflow and no significant disruption. No luminal narrowing   and TIMI-3 flow in the distal LAD. There were no significant mid LAD   stenoses.   4. The circumflex coronary artery provided a ramus intermedius and a   moderate mid vessel marginal. The ramus intermedius was tortuous but   appeared free of significant disease. There was a high diagonal as well   which was tortuous and paralleled the ramus. It was small in caliber and   extent, was diffusely irregular but no high-grade stenosis.   5. The circumflex coronary artery contained minor irregularity proximally   and then had a 40-50% stenosis. It was ectatic before it gave rise to the   mid vessel marginal. The mid vessel marginal had 50% stenosis in its   origin. The circumflex after the marginal origin contained an 80%   stenosis but was very limited in the distal circulation. On review of the   left system anatomy. There was no significant change from that previously   in October 2011.  6. The right coronary artery was also patulous and irregular, large   caliber trunk, tortuous with a mid vessel 40% stenosis, with a small   button of ulceration. Then was diffusely irregular throughout its distal   half. There was evidence of previous stenting from the distal  right into   the PDA. The stented segment was widely patent. There was good luminal   appearance inflow and outflow to a large PDA. There was a subsegmental   PDA which was patent and had minor irregularity throughout. This was much   smaller in caliber than the primary PDA.   7. 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   ??   IMPRESSION   1. Stable pattern of diffuse coronary disease as described.   2. Mild reduction in left ventricular systolic function with an apical   wall motion abnormality.      Stress Test: n/a       Past Medical History, Past Surgical History, Family history, Social History, and Medications were all reviewed with the patient today and updated as necessary.         Current Outpatient Medications          Medication  Sig  Dispense  Refill           ?  ferrous fumarate/vit Bcomp,C (SUPER B COMPLEX PO)  Take  by mouth.         ?  losartan (COZAAR) 25 mg tablet  Take 1 Tab by mouth daily.  30 Tab  11           ?  metoprolol succinate (TOPROL-XL) 100 mg tablet  Take 1 Tab by mouth two (2) times a day.  60 Tab  11           ?  apixaban (ELIQUIS) 5 mg tablet  Take 1 Tab by mouth two (2) times a day.  60 Tab  11     ?  furosemide (LASIX) 40 mg tablet  Take 1 Tab by mouth daily.  30 Tab  11     ?  clopidogrel (PLAVIX) 75 mg tab  Take 1 Tab by mouth daily.  30 Tab  11     ?  potassium chloride (KLOR-CON) 10 mEq tablet  Take 1 Tab by mouth two (2) times a day.  60 Tab  6     ?  nitroglycerin (NITROSTAT) 0.4 mg SL tablet  1 Tab by SubLINGual route as needed for Chest Pain. Up to 3 doses.  1 Bottle  5     ?  dilTIAZem CD (CARDIZEM CD) 120 mg ER capsule  Take 1 Cap by mouth daily.  30 Cap  11     ?  loratadine (CLARITIN) 10 mg tablet  Take 10 mg by mouth daily.         ?  ezetimibe (ZETIA) 10 mg tablet  Take 10 mg by mouth daily.               ?  atorvastatin (LIPITOR) 10 mg tablet  Take 1 Tab by  mouth daily.  30 Tab  11          Allergies        Allergen  Reactions         ?  Aspirin  Hives     ?  Gluten  Other (comments)  Abdominal pain and hip pain         ?  Tylenol [Acetaminophen]  Hives          Patient Active Problem List          Diagnosis        ?  Anterior myocardial infarction [facility])     ?  Essential hypertension, benign     ?  Coronary atherosclerosis of native coronary artery     ?  Dyslipidemia     ?  Atrial fibrillation with RVR (HCC)     ?  Thrombus of left atrial appendage     ?  Chest pain, unspecified     ?  Unstable angina (HCC)     ?  Hypertension        ?  CAD (coronary artery disease)             Past Medical History:        Diagnosis  Date         ?  Atrial fibrillation with RVR (HCC)  11/05/2017     ?  CAD (coronary artery disease)       ?  CVA (cerebral vascular accident) (HCC)  09/05/2017     ?  Dyslipidemia       ?  Hypertension           ?  Unstable angina (HCC)  02/02/2010          Past Surgical History:         Procedure  Laterality  Date          ?  CARDIAC SURG PROCEDURE UNLIST    11/2009          stent x1 LAD           ?  PR LEFT HEART CATH,PERCUTANEOUS    02/02/2010          stent x1        No family history on file.     Social History          Tobacco Use         ?  Smoking status:  Never Smoker     ?  Smokeless tobacco:  Never Used       Substance Use Topics         ?  Alcohol use:  No           ROS:  A comprehensive review of systems was performed with the pertinent positives and negatives as noted in the HPI in addition to:      ROS         PHYSICAL EXAM:       Visit Vitals      BP  130/80     Ht  6\' 4"  (1.93 m)     Wt  247 lb (112 kg)        BMI  30.07 kg/m??              Wt Readings from Last 3 Encounters:        11/19/17  247 lb (112 kg)     11/07/17  235 lb 14.4 oz (107 kg)        10/08/17  252 lb 9.6 oz (114.6 kg)          BP Readings from Last 3 Encounters:        11/19/17  130/80     11/07/17  (!) 134/95  10/23/17  (!) 150/98           Gen: Well  appearing, well developed, no acute distress   Eyes: Pupils equal, round. Extraocular movements are intact   ENT: Oropharynx clear, no oral lesions, normal dentition   CV: S1S2, regular rate and rhythm, no murmurs, rubs or gallops, normal JVD, no carotid bruits, normal distal pulses, no LEE   Pulm: Clear to auscultation bilaterally, no accessory muscle uses, no wheezes or rales   GI: Soft, NT, ND, +BS   Neuro: Alert and oriented, nonfocal   Psych: Appropriate affect   Skin: Normal color and skin turgor   MSK: Normal muscle bulk and tone      Medical problems and test results were reviewed with the patient today.       No results found for any visits on 11/19/17.        Lab Results         Component  Value  Date/Time            Potassium  4.0  11/05/2017 07:32 AM          Lab Results         Component  Value  Date/Time            Creatinine  1.22  11/05/2017 07:32 AM          Lab Results         Component  Value  Date/Time            HGB  13.6  11/05/2017 07:32 AM          Lab Results         Component  Value  Date/Time            INR  1.2  11/07/2017 04:14 AM       INR  1.2  11/06/2017 04:02 AM       INR  1.1  11/05/2017 02:12 PM       Prothrombin time  15.4 (H)  11/07/2017 04:14 AM       Prothrombin time  14.6 (H)  11/06/2017 04:02 AM            Prothrombin time  14.5  11/05/2017 02:12 PM

## 2017-11-19 NOTE — Patient Instructions (Signed)
Atrial Fibrillation: Care Instructions  Your Care Instructions    Atrial fibrillation is an irregular and often fast heartbeat. Treating this condition is important for several reasons. It can cause blood clots, which can travel from your heart to your brain and cause a stroke. If you have a fast heartbeat, you may feel lightheaded, dizzy, and weak. An irregular heartbeat can also increase your risk for heart failure.  Atrial fibrillation is often the result of another heart condition, such as high blood pressure or coronary artery disease. Making changes to improve your heart condition will help you stay healthy and active.  Follow-up care is a key part of your treatment and safety. Be sure to make and go to all appointments, and call your doctor if you are having problems. It's also a good idea to know your test results and keep a list of the medicines you take.  How can you care for yourself at home?  Medicines  ?? ?? Take your medicines exactly as prescribed. Call your doctor if you think you are having a problem with your medicine. You will get more details on the specific medicines your doctor prescribes.   ?? ?? If your doctor has given you a blood thinner to prevent a stroke, be sure you get instructions about how to take your medicine safely. Blood thinners can cause serious bleeding problems.   ?? ?? Do not take any vitamins, over-the-counter drugs, or herbal products without talking to your doctor first.   ??Lifestyle changes  ?? ?? Do not smoke. Smoking can increase your chance of a stroke and heart attack. If you need help quitting, talk to your doctor about stop-smoking programs and medicines. These can increase your chances of quitting for good.   ?? ?? Eat a heart-healthy diet.   ?? ?? Stay at a healthy weight. Lose weight if you need to.   ?? ?? Limit alcohol to 2 drinks a day for men and 1 drink a day for women. Too much alcohol can cause health problems.    ?? ?? Avoid colds and flu. Get a pneumococcal vaccine shot. If you have had one before, ask your doctor whether you need another dose. Get a flu shot every year. If you must be around people with colds or flu, wash your hands often.   Activity  ?? ?? If your doctor recommends it, get more exercise. Walking is a good choice. Bit by bit, increase the amount you walk every day. Try for at least 30 minutes on most days of the week. You also may want to swim, bike, or do other activities. Your doctor may suggest that you join a cardiac rehabilitation program so that you can have help increasing your physical activity safely.   ?? ?? Start light exercise if your doctor says it is okay. Even a small amount will help you get stronger, have more energy, and manage stress. Walking is an easy way to get exercise. Start out by walking a little more than you did in the hospital. Gradually increase the amount you walk.   ?? ?? When you exercise, watch for signs that your heart is working too hard. You are pushing too hard if you cannot talk while you are exercising. If you become short of breath or dizzy or have chest pain, sit down and rest immediately.   ?? ?? Check your pulse regularly. Place two fingers on the artery at the palm side of your wrist, in line with your thumb.   If your heartbeat seems uneven or fast, talk to your doctor.   When should you call for help?  Call 911 anytime you think you may need emergency care. For example, call if:  ?? ?? You have symptoms of a heart attack. These may include:  ? Chest pain or pressure, or a strange feeling in the chest.  ? Sweating.  ? Shortness of breath.  ? Nausea or vomiting.  ? Pain, pressure, or a strange feeling in the back, neck, jaw, or upper belly or in one or both shoulders or arms.  ? Lightheadedness or sudden weakness.  ? A fast or irregular heartbeat.  After you call 911, the operator may tell you to chew 1 adult-strength or  2 to 4 low-dose aspirin. Wait for an ambulance. Do not try to drive yourself.   ?? ?? You have symptoms of a stroke. These may include:  ? Sudden numbness, tingling, weakness, or loss of movement in your face, arm, or leg, especially on only one side of your body.  ? Sudden vision changes.  ? Sudden trouble speaking.  ? Sudden confusion or trouble understanding simple statements.  ? Sudden problems with walking or balance.  ? A sudden, severe headache that is different from past headaches.   ?? ?? You passed out (lost consciousness).   ??Call your doctor now or seek immediate medical care if:  ?? ?? You have new or increased shortness of breath.   ?? ?? You feel dizzy or lightheaded, or you feel like you may faint.   ?? ?? Your heart rate becomes irregular.   ?? ?? You can feel your heart flutter in your chest or skip heartbeats. Tell your doctor if these symptoms are new or worse.   ??Watch closely for changes in your health, and be sure to contact your doctor if you have any problems.  Where can you learn more?  Go to [employer].  Enter U020 in the search box to learn more about "Atrial Fibrillation: Care Instructions."  Current as of: November 05, 2016  Content Version: 12.1  ?? 2006-2019 [employer]. Care instructions adapted under license by [employer] (which disclaims liability or warranty for this information). If you have questions about a medical condition or this instruction, always ask your healthcare professional. [employer] disclaims any warranty or liability for your use of this information.           Dilated Cardiomyopathy: Care Instructions  Your Care Instructions    Dilated cardiomyopathy is a condition that weakens your heart muscle and causes it to stretch, or dilate. When your heart muscle is weak, it can't pump out blood as well as it should. More blood stays in your heart after  each heartbeat. As more blood fills and stays in the heart, the heart muscle stretches even more and gets even weaker.  Many things can cause dilated cardiomyopathy. It can be caused by another disease or condition, such as high blood pressure or a heart attack. Some people have a family history of dilated cardiomyopathy. For some people, the cause is not known.  You may not have any symptoms at first. Or you may have mild symptoms, such as feeling very tired or weak. If your heart gets weaker, you may develop heart failure. Heart failure means that your heart muscle doesn't pump as much blood as your body needs. If this happens, you will feel other symptoms such as shortness of breath or trouble breathing when you lie  down.  The goal of treatment is to slow the disease and help you feel better. You may also have treatment for the cause of the cardiomyopathy. You will probably take a few medicines. If your doctor thinks it will help your heart and prevent problems, you may get a device such as a pacemaker. Self-care is another important part of your treatment. It includes the things you can do every day to feel better and stay as healthy as possible.  Follow-up care is a key part of your treatment and safety. Be sure to make and go to all appointments, and call your doctor if you are having problems. It's also a good idea to know your test results and keep a list of the medicines you take.  How can you care for yourself at home?  Medicines  ?? ?? Be safe with medicines. Take your medicines exactly as prescribed. Call your doctor if you think you are having a problem with your medicine. You may be taking some of the following medicines:  ? Angiotensin-converting enzyme (ACE) inhibitors or angiotensin II receptor blockers (ARBs). These make it easier for blood to flow.  ? Diuretics. These help remove excess fluid from the body.  ? Beta-blockers. These slow the heart rate and can help the heart fill  with blood more completely.   ??Heart-healthy lifestyle  ?? ?? Be active. Exercise regularly, but don't exercise too hard. If you aren't already active, your doctor may want you to start exercising. But don't start until you have talked with your doctor to make an exercise program that is safe for you.   ?? ?? Do not smoke. Smoking can make a heart condition worse. If you need help quitting, talk to your doctor about stop-smoking programs and medicines. These can increase your chances of quitting for good.   ?? ?? Eat a heart-healthy diet.   ?? ?? Stay at a healthy weight. Lose weight if you need to.   ?? ?? If your doctor recommends it, limit sodium. This helps keep fluid from building up in your body. It may help you feel better.   ??Weight monitoring  ?? ?? Weigh yourself without clothing at the same time each day. Record your weight. Call your doctor if you have a sudden weight gain, such as more than 2 to 3 pounds in a day or 5 pounds in a week. (Your doctor may suggest a different range of weight gain.) A sudden weight gain may mean that your condition is getting worse.   When should you call for help?  Call 911 anytime you think you may need emergency care. For example, call if:  ?? ?? You have symptoms of sudden heart failure. These may include:  ? Severe trouble breathing.  ? A fast or irregular heartbeat.  ? Coughing up pink, foamy mucus.  ? Passing out.   ??Call your doctor now or seek immediate medical care if:  ?? ?? You have new or changed symptoms of heart failure, such as:  ? New or increased shortness of breath.  ? New or worse swelling in your legs, ankles, or feet.  ? Sudden weight gain, such as more than 2 to 3 pounds in a day or 5 pounds in a week. (Your doctor may suggest a different range of weight gain.)  ? Feeling dizzy or lightheaded or like you may faint.  ? Feeling so tired or weak that you cannot do your usual activities.  ? Not sleeping well. Shortness   of breath wakes you at night. You need  extra pillows to prop yourself up to breathe easier.   ??Watch closely for changes in your health, and be sure to contact your doctor if you have any problems.  Where can you learn more?  Go to [email or web address]www.healthwise.net[employer].  Enter B164 in the search box to learn more about "Dilated Cardiomyopathy: Care Instructions."  Current as of: November 05, 2016  Content Version: 12.1  ?? 2006-2019 [employer]. Care instructions adapted under license by [employer] (which disclaims liability or warranty for this information). If you have questions about a medical condition or this instruction, always ask your healthcare professional. [employer] disclaims any warranty or liability for your use of this information.

## 2017-11-19 NOTE — Progress Notes (Signed)
[facility]  [address], [address]  [place], [place] [address]  PHONE: [phone]        11/19/17        NAME:  [name] [name]  DOB: Aug 26, 1955  MRN: [id number]     Follow Up     ASSESSMENT and PLAN:  Diagnoses and all orders for this visit:    1. CAD     2. Thrombus of left atrial appendage    3. Essential hypertension    4. Atherosclerosis of native coronary artery of native heart with stable angina pectoris (HCC)    5. Atrial fibrillation with RVR (HCC)    6. Anterior myocardial infarction (HCC)    7. Dyslipidemia    8. Stroke 08/5179     63 year old male with persistent AF with finding of LAA thrombus and reduced EF. I suspect his EF is reduced, at least in part from his AF. Maintenance of rhythm is ideal but we cannot do this at this time given his presence of LAA thrombus. Given the nature of warfarin and high risk of subtherapeutic dosing that comes with warfarin, we will place him on Eliquis for better coverage of his anticoagulation. He will need a follow up TEE in the next couple of weeks. We further discussed the role of CASTLE-AF and CABANA trials and the reduction of mortality in those with AF with cardiomyopathy. He will be a good candidate for AF ablation. 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     -Continue Eliquis.   -Continue AV nodal blockers.  -Plan for TEE/DCCV.  -Downstream AF ablation and possible ICD.  -Continue wearing LifeVest for now.     We fully reviewed the risks and benefits of TEE/DCCV. Smalls risks of esphogeal injury were discussed in addition skin irritation/burns, very small risk of stroke/heart attack or death and/or recurrent arrhythmia.     AF ABLATION EDUCATION (done today):  I discussed with the patient the pathophysiology of atrial fibrillation, including details about potential triggers from the pulmonary veins (PVs), as well as non-PV sites.  We also discussed that in certain patients  (especially those with more persistent AF) there is often atrial substrate (i.e. fibrosis, dilatation, etc.) that promotes more sustained AF. We also discussed the therapeutic options for treatment of atrial fibrillation, including:  --Rate control   --Rhythm control with antiarrhythmic drugs (AAD) and supplemental rate control  --Catheter ablation, including pulmonary vein isolation (PVI), with or without additional substrate modification, in attempt to maintain sinus rhythm long-term  --AV nodal ablation with a permanent pacemaker (ablate & pace).    I emphasized to the patient that all of these options require stroke prophylaxis with oral anticoagulation therapy (OAC) with  Coumadin or novel oral anticoagulant (NOACs), depending on risk factors. I explained that successful catheter ablation with sufficient long-term follow-up documenting a lack of recurrent AF may allow for discontinuation of anticoagulation in the future; however, this has not been proven safe in prospective clinical studies and is still considered experimental.  Data from retrospective trials, however, has suggested that stopping oral anticoagulation after successful ablation does not result in increased in stroke rates and appears to be safe.    The benefits and risks of each of these approaches were outlined in detail.  We discussed the variable success rates of AF ablation, which can range from 50-80+%, depending on multiple factors, including paroxysmal vs. persistent AF, duration of AF, AF burden, left atrial size  and remodeling, concomitant valvular or other structural heart disease, non-PV triggers, prior ablation lesion sets, obstructive sleep apnea, and other potential confounding factors.  I also explained that often (approximately 30-50% of the time) patients will require multiple procedures to achieve long-term AF suppression.  Additionally, we discussed that ablation may  decrease AF burden and/or symptoms, but additional therapeutic strategies (i.e. concomitant AAD therapy, rate controlling medications, oral anticoagulants, etc.) may be needed long term for AF management.    The catheter ablation procedure was described to the patient in detail, including the risks of recurrent AF/AT, need for repeat ablation, esophageal perforation/fistula, pulmonary vein stenosis,  bronchial injury/fistula, stroke, cardiac perforation with the need for catheter drainage or surgical repair, vascular damage, DVT/PE, bleeding, thermal skin burns, radiation skin injury, kidney failure, pneumo/hemothorax, need for permanent pacemaker, phrenic or vagus nerve damage resulting in diaphragmatic paralysis or gastroparesis, stiff left atrial syndrome, and even death.      We also discussed in detail today the options for anticoagulation for AF , including the periprocedure period.  Published data (the ARISTOTLE trial, NEJM 2011) suggests that apixiban is non-inferior  to warfarin for stroke prevention in AF, and appears to  be safer with reductions in bleeding and overall mortality.    Also, using apixiban will allow us to avoid routine lab monitoring as well as many drug-drug and food-drug interactions. Additionally, using apixiban will allow us to avoid using LMWH periprocedurally, which may reduce bleeding and hematomas.  The patient is aware of these risks/benefits and wishes to proceed with using apixiban periprocedurally.    Patient has been instructed and agrees to call our office with any issues or other concerns related to their cardiac condition(s) and/or complaint(s).    Thank you for allowing me to participate in the electrophysiologic care of Mr. Buckner MaltaJames C Peralta. Please contact me if any questions or concerns were to arise.    Harriet ButteJeffrey J. Carrson Lightcap MD, MS  Clinical Cardiac Electrophysiology  [facility]  11/19/17  3:16 PM    ===================================================================   Chief Complant:    Chief Complaint   Patient presents with   ??? Irregular Heart Beat     hospital follow up        Consultation is requested by Rhett BannisterJeffrey J Laurann Mcmorris for evaluation of Irregular Heart Beat (hospital follow up)      History:  Buckner MaltaJames C Wilshire is a most pleasant 62 y.o. male with a past medical and cardiac history significant for anterior MI, CVA (May 2019, received TPA) , HTN, CAD, dyslipidemia, AFIB and HTN who underwent TEE on 11/05/17. He was found to have a reduced EF of 25% with anterior apical akinesis. He was found to have an LA thrombus that was confirmed with definity. Patient was admitted and started on IV heparin with bridge to warfarin.     He comes in for followup. He was discharged on Eliquis. He's been taking. He was only taking Xarelto 10 mg po daily when he was found to have a thrombus in his LAA. He is wearing a LifeVest. He denies shocks. He remains in AF. The patient otherwise denies chest pain, dyspnea, presyncope, syncope or lateralizing symptoms.    Cardiac PMH: (Old records have been reviewed and summarized below)    EKG:  (EKG has been independently visualized by me with interpretation below): Atrial fibrillation with mild tachycardia.     ECHO: 11/05/2017  - ??Left ventricle: The ventricle was dilated. Systolic function was markedly  reduced. Ejection fraction was  estimated in the range of 15 % to 20 %.    - ??Right ventricle: Systolic function was reduced.    - ??Left atrium: The atrium was dilated.  - ??Left atrial appendage: There was continuous spontaneous echo contrast  ("smoke") in the appendage. There was thrombus noted in the LAA. Due to  thrombus cardioversion was not performed.    - ??Atrial septum: Appeared to be a PFO by color doppler.    - ??Right atrium: The atrium was dilated.    - ??Mitral valve: There was mild regurgitation.    - ??Tricuspid valve: There was mild regurgitation.    - ??Aorta, systemic arteries: There was mild atheroma.    Previous Heart Catheterization: 09/2010   CORONARY ARTERIOGRAMS  1. Both the right and left coronaries were large and patulous  proximally.  2. The left main coronary was quite large, was huge, and then tapered to  the LAD, circumflex systems.  3. The LAD coronary had evidence of proximal stenting which emanated from  the left main. The stented segment appeared widely patent with a good  inflow and outflow and no significant disruption. No luminal narrowing  and TIMI-3 flow in the distal LAD. There were no significant mid LAD  stenoses.  4. The circumflex coronary artery provided a ramus intermedius and a  moderate mid vessel marginal. The ramus intermedius was tortuous but  appeared free of significant disease. There was a high diagonal as well  which was tortuous and paralleled the ramus. It was small in caliber and  extent, was diffusely irregular but no high-grade stenosis.  5. The circumflex coronary artery contained minor irregularity proximally  and then had a 40-50% stenosis. It was ectatic before it gave rise to the  mid vessel marginal. The mid vessel marginal had 50% stenosis in its  origin. The circumflex after the marginal origin contained an 80%  stenosis but was very limited in the distal circulation. On review of the  left system anatomy. There was no significant change from that previously  in October 2011.  6. The right coronary artery was also patulous and irregular, large  caliber trunk, tortuous with a mid vessel 40% stenosis, with a small  button of ulceration. Then was diffusely irregular throughout its distal  half. There was evidence of previous stenting from the distal right into  the PDA. The stented segment was widely patent. There was good luminal  appearance inflow and outflow to a large PDA. There was a subsegmental  PDA which was patent and had minor irregularity throughout. This was much  smaller in caliber than the primary PDA.  7. 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  ??  IMPRESSION  1. Stable pattern of diffuse coronary disease as described.  2. Mild reduction in left ventricular systolic function with an apical  wall motion abnormality.    Stress Test: n/a     Past Medical History, Past Surgical History, Family history, Social History, and Medications were all reviewed with the patient today and updated as necessary.     Current Outpatient Medications   Medication Sig Dispense Refill   ??? ferrous fumarate/vit Bcomp,C (SUPER B COMPLEX PO) Take  by mouth.     ??? losartan (COZAAR) 25 mg tablet Take 1 Tab by mouth daily. 30 Tab 11   ??? metoprolol succinate (TOPROL-XL) 100 mg tablet Take 1 Tab by mouth two (  2) times a day. 60 Tab 11   ??? apixaban (ELIQUIS) 5 mg tablet Take 1 Tab by mouth two (2) times a day. 60 Tab 11   ??? furosemide (LASIX) 40 mg tablet Take 1 Tab by mouth daily. 30 Tab 11   ??? clopidogrel (PLAVIX) 75 mg tab Take 1 Tab by mouth daily. 30 Tab 11   ??? potassium chloride (KLOR-CON) 10 mEq tablet Take 1 Tab by mouth two (2) times a day. 60 Tab 6   ??? nitroglycerin (NITROSTAT) 0.4 mg SL tablet 1 Tab by SubLINGual route as needed for Chest Pain. Up to 3 doses. 1 Bottle 5   ??? dilTIAZem CD (CARDIZEM CD) 120 mg ER capsule Take 1 Cap by mouth daily. 30 Cap 11   ??? loratadine (CLARITIN) 10 mg tablet Take 10 mg by mouth daily.     ??? ezetimibe (ZETIA) 10 mg tablet Take 10 mg by mouth daily.     ??? atorvastatin (LIPITOR) 10 mg tablet Take 1 Tab by mouth daily. 30 Tab 11     Allergies   Allergen Reactions   ??? Aspirin Hives   ??? Gluten Other (comments)     Abdominal pain and hip pain   ??? Tylenol [Acetaminophen] Hives     Patient Active Problem List    Diagnosis   ??? Anterior myocardial infarction [facility])   ??? Essential hypertension, benign   ??? Coronary atherosclerosis of native coronary artery   ??? Dyslipidemia   ??? Atrial fibrillation with RVR (HCC)   ??? Thrombus of left atrial appendage   ??? Chest pain, unspecified    ??? Unstable angina (HCC)   ??? Hypertension   ??? CAD (coronary artery disease)       Past Medical History:   Diagnosis Date   ??? Atrial fibrillation with RVR (HCC) 11/05/2017   ??? CAD (coronary artery disease)    ??? CVA (cerebral vascular accident) (HCC) 09/05/2017   ??? Dyslipidemia    ??? Hypertension    ??? Unstable angina (HCC) 02/02/2010     Past Surgical History:   Procedure Laterality Date   ??? CARDIAC SURG PROCEDURE UNLIST  11/2009    stent x1 LAD    ??? PR LEFT HEART CATH,PERCUTANEOUS  02/02/2010    stent x1     No family history on file.  Social History     Tobacco Use   ??? Smoking status: Never Smoker   ??? Smokeless tobacco: Never Used   Substance Use Topics   ??? Alcohol use: No       ROS:  A comprehensive review of systems was performed with the pertinent positives and negatives as noted in the HPI in addition to:    ROS      PHYSICAL EXAM:     Visit Vitals  BP 130/80   Ht 6\' 4"  (1.93 m)   Wt 247 lb (112 kg)   BMI 30.07 kg/m??        Wt Readings from Last 3 Encounters:   11/19/17 247 lb (112 kg)   11/07/17 235 lb 14.4 oz (107 kg)   10/08/17 252 lb 9.6 oz (114.6 kg)     BP Readings from Last 3 Encounters:   11/19/17 130/80   11/07/17 (!) 134/95   10/23/17 (!) 150/98       Gen: Well appearing, well developed, no acute distress  Eyes: Pupils equal, round. Extraocular movements are intact  ENT: Oropharynx clear, no oral lesions, normal dentition  CV: S1S2, regular rate  and rhythm, no murmurs, rubs or gallops, normal JVD, no carotid bruits, normal distal pulses, no LEE  Pulm: Clear to auscultation bilaterally, no accessory muscle uses, no wheezes or rales  GI: Soft, NT, ND, +BS  Neuro: Alert and oriented, nonfocal  Psych: Appropriate affect  Skin: Normal color and skin turgor  MSK: Normal muscle bulk and tone    Medical problems and test results were reviewed with the patient today.     No results found for any visits on 11/19/17.    Lab Results   Component Value Date/Time    Potassium 4.0 11/05/2017 07:32 AM     Lab Results    Component Value Date/Time    Creatinine 1.22 11/05/2017 07:32 AM     Lab Results   Component Value Date/Time    HGB 13.6 11/05/2017 07:32 AM     Lab Results   Component Value Date/Time    INR 1.2 11/07/2017 04:14 AM    INR 1.2 11/06/2017 04:02 AM    INR 1.1 11/05/2017 02:12 PM    Prothrombin time 15.4 (H) 11/07/2017 04:14 AM    Prothrombin time 14.6 (H) 11/06/2017 04:02 AM    Prothrombin time 14.5 11/05/2017 02:12 PM

## 2017-11-23 ENCOUNTER — Encounter: Attending: Cardiovascular Disease | Primary: Family Medicine

## 2017-12-17 ENCOUNTER — Other Ambulatory Visit: Payer: Self-pay | Admitting: Family Medicine

## 2017-12-17 DIAGNOSIS — M1990 Unspecified osteoarthritis, unspecified site: Secondary | ICD-10-CM

## 2017-12-17 NOTE — Progress Notes (Signed)
Patient pre-assessment complete for TEE/Cardioversion with DR Devona Konig scheduled for 12/18/17 at 10am, arrival time 8am. Patient verified using DOB. Patient instructed to bring all home medications in labeled bottles on the day of procedure. NPO status reinforced. P. Patient instructed to HOLD Lasix in am . Instructed they can take all other medications excluding vitamins & supplements. Patient verbalizes understanding of all instructions & denies any questions at this time.

## 2017-12-17 NOTE — Progress Notes (Signed)
Patient pre-assessment complete for TEE/Cardioversion with DR Senfield scheduled for 12/18/17 at 10am, arrival time 8am. Patient verified using DOB. Patient instructed to bring all home medications in labeled bottles on the day of procedure. NPO status reinforced. P. Patient instructed to HOLD Lasix in am . Instructed they can take all other medications excluding vitamins & supplements. Patient verbalizes understanding of all instructions & denies any questions at this time.

## 2017-12-18 ENCOUNTER — Inpatient Hospital Stay: Payer: Charity

## 2017-12-18 ENCOUNTER — Ambulatory Visit: Admit: 2017-12-18 | Payer: Charity | Primary: Family Medicine

## 2017-12-18 LAB — METABOLIC PANEL, BASIC
Anion gap: 6 mmol/L — ABNORMAL LOW (ref 7–16)
BUN: 23 MG/DL (ref 8–23)
CO2: 30 mmol/L (ref 21–32)
Calcium: 9 MG/DL (ref 8.3–10.4)
Chloride: 105 mmol/L (ref 98–107)
Creatinine: 1 MG/DL (ref 0.8–1.5)
GFR est AA: >60 mL/min/{1.73_m2} (ref >60)
GFR est non-AA: >60 mL/min/{1.73_m2} (ref >60)
Glucose: 89 mg/dL (ref 65–100)
Potassium: 3.9 mmol/L (ref 3.5–5.1)
Sodium: 141 mmol/L (ref 136–145)

## 2017-12-18 LAB — EKG, 12 LEAD, INITIAL
Atrial Rate: 220 {beats}/min
Calculated R Axis: -22 degrees
Calculated T Axis: 114 degrees
Q-T Interval: 374 ms
QRS Duration: 100 ms
QTC Calculation (Bezet): 441 ms
Ventricular Rate: 84 {beats}/min

## 2017-12-18 LAB — CBC W/O DIFF
ABSOLUTE NRBC: 0 10*3/uL (ref 0.0–0.2)
HCT: 41.7 % (ref 41.1–50.3)
HGB: 13.3 g/dL — ABNORMAL LOW (ref 13.6–17.2)
MCH: 28.3 PG (ref 26.1–32.9)
MCHC: 31.9 g/dL (ref 31.4–35.0)
MCV: 88.7 FL (ref 79.6–97.8)
MPV: 11.7 FL (ref 9.4–12.3)
PLATELET: 174 10*3/uL (ref 150–450)
RBC: 4.7 M/uL (ref 4.23–5.6)
RDW: 16 % — ABNORMAL HIGH (ref 11.9–14.6)
WBC: 5.9 10*3/uL (ref 4.3–11.1)

## 2017-12-18 LAB — MAGNESIUM
Magnesium: 2.2 mg/dL (ref 1.8–2.4)
Magnesium: 2.2 mg/dL (ref 1.8–2.4)

## 2017-12-18 LAB — PROTHROMBIN TIME + INR
INR: 1.1
Prothrombin time: 14.6 s — ABNORMAL HIGH (ref 11.7–14.5)

## 2017-12-18 LAB — BASIC METABOLIC PANEL
Anion Gap: 6 mmol/L — ABNORMAL LOW (ref 7–16)
BUN: 23 MG/DL (ref 8–23)
CO2: 30 mmol/L (ref 21–32)
Calcium: 9 MG/DL (ref 8.3–10.4)
Chloride: 105 mmol/L (ref 98–107)
Creatinine: 1 MG/DL (ref 0.8–1.5)
EGFR IF NonAfrican American: >60 mL/min/{1.73_m2} (ref >60)
GFR African American: >60 mL/min/{1.73_m2} (ref >60)
Glucose: 89 mg/dL (ref 65–100)
Potassium: 3.9 mmol/L (ref 3.5–5.1)
Sodium: 141 mmol/L (ref 136–145)

## 2017-12-18 LAB — EKG 12-LEAD
Atrial Rate: 220 {beats}/min
Q-T Interval: 374 ms
QRS Duration: 100 ms
QTc Calculation (Bazett): 441 ms
R Axis: -22 degrees
T Axis: 114 degrees
Ventricular Rate: 84 {beats}/min

## 2017-12-18 LAB — PROTIME-INR
INR: 1.1
Protime: 14.6 s — ABNORMAL HIGH (ref 11.7–14.5)

## 2017-12-18 LAB — CBC
Hematocrit: 41.7 % (ref 41.1–50.3)
Hemoglobin: 13.3 g/dL — ABNORMAL LOW (ref 13.6–17.2)
MCH: 28.3 PG (ref 26.1–32.9)
MCHC: 31.9 g/dL (ref 31.4–35.0)
MCV: 88.7 FL (ref 79.6–97.8)
MPV: 11.7 FL (ref 9.4–12.3)
NRBC Absolute: 0 10*3/uL (ref 0.0–0.2)
Platelets: 174 10*3/uL (ref 150–450)
RBC: 4.7 M/uL (ref 4.23–5.6)
RDW: 16 % — ABNORMAL HIGH (ref 11.9–14.6)
WBC: 5.9 10*3/uL (ref 4.3–11.1)

## 2017-12-18 MED ORDER — LIDOCAINE (PF) 20 MG/ML (2 %) IJ SOLN
20 mg/mL (2 %) | INTRAMUSCULAR | Status: AC
Start: 2017-12-18 — End: ?

## 2017-12-18 MED ORDER — PROPOFOL 10 MG/ML IV EMUL
10 mg/mL | INTRAVENOUS | Status: AC
Start: 2017-12-18 — End: ?

## 2017-12-18 MED ORDER — PROPOFOL 10 MG/ML IV EMUL
10 mg/mL | INTRAVENOUS | Status: DC | PRN
Start: 2017-12-18 — End: 2017-12-18
  Administered 2017-12-18 (×2): via INTRAVENOUS

## 2017-12-18 MED ORDER — PERFLUTREN LIPID MICROSPHERES 1.1 MG/ML IV
1.1 mg/mL | INTRAVENOUS | Status: DC | PRN
Start: 2017-12-18 — End: 2017-12-18
  Administered 2017-12-18: 14:00:00 via INTRAVENOUS

## 2017-12-18 MED ORDER — LIDOCAINE (PF) 20 MG/ML (2 %) IJ SOLN
20 mg/mL (2 %) | INTRAMUSCULAR | Status: DC | PRN
Start: 2017-12-18 — End: 2017-12-18
  Administered 2017-12-18: 16:00:00 via INTRAVENOUS

## 2017-12-18 MED ORDER — LACTATED RINGERS IV
INTRAVENOUS | Status: DC
Start: 2017-12-18 — End: 2017-12-18
  Administered 2017-12-18: 15:00:00 via INTRAVENOUS

## 2017-12-18 MED FILL — DEFINITY 1.1 MG/ML INTRAVENOUS SUSPENSION: 1.1 mg/mL | INTRAVENOUS | Qty: 1.3

## 2017-12-18 MED FILL — PROPOFOL 10 MG/ML IV EMUL: 10 mg/mL | INTRAVENOUS | Qty: 40

## 2017-12-18 MED FILL — XYLOCAINE-MPF 20 MG/ML (2 %) INJECTION SOLUTION: 20 mg/mL (2 %) | INTRAMUSCULAR | Qty: 5

## 2017-12-18 MED FILL — XYLOCAINE-MPF 20 MG/ML (2 %) INJECTION SOLUTION: 20 mg/mL (2 %) | INTRAMUSCULAR | Qty: 40

## 2017-12-18 MED FILL — PROPOFOL 10 MG/ML IV EMUL: 10 mg/mL | INTRAVENOUS | Qty: 80

## 2017-12-18 NOTE — Progress Notes (Signed)
Patient awake and alert. Discharge instructions for TEE/CVN reviewed. INT removed with cath intact.

## 2017-12-18 NOTE — Progress Notes (Signed)
Patient received to CPRU room # 11  Ambulatory from lobby. Patient scheduled for CVN today with Dr Senfield. Procedure reviewed & questions answered, voiced good understanding consent obtained & placed on chart. All medications and medical history reviewed. Will prep patient per orders. Patient & family updated on plan of care.      The patient has a fraility score of 3-MANAGING WELL, based on patient A&Ox3, patient able to ambulate to room without difficulty.

## 2017-12-18 NOTE — Anesthesia Pre-Procedure Evaluation (Signed)
Relevant Problems   No relevant active problems       Anesthetic History   No history of anesthetic complications            Review of Systems / Medical History  Patient summary reviewed and pertinent labs reviewed    Pulmonary  Within defined limits                 Neuro/Psych       CVA: no residual symptoms       Cardiovascular    Hypertension        Dysrhythmias (x2 - on Plavix) : atrial fibrillation  CAD and cardiac stents    Exercise tolerance: >4 METS     GI/Hepatic/Renal  Within defined limits              Endo/Other  Within defined limits           Other Findings              Physical Exam    Airway  Mallampati: III  TM Distance: 4 - 6 cm  Neck ROM: normal range of motion   Mouth opening: Normal     Cardiovascular    Rhythm: irregular  Rate: normal         Dental         Pulmonary  Breath sounds clear to auscultation               Abdominal         Other Findings            Anesthetic Plan    ASA: 3  Anesthesia type: total IV anesthesia          Induction: Intravenous  Anesthetic plan and risks discussed with: Patient and Spouse

## 2017-12-18 NOTE — Procedures (Signed)
Operator: Elisabeth Pigeon. Nanine Means, MD    Referring: Orvilla Cornwall, MD    Pre-Procedure Diagnosis  1. Atrial fibrillation  ??  Procedure Performed  1. Transesophageal Echocardiogram  2. Direct Current Cardioversion    Anesthesia: MAC     Specimens: * No specimens in log *     Procedural Description: The patient was brought to the operating suite in a fasting, nonsedated state. The risks, benefits and alternatives of the procedure were reviewed with the patient, and final questions answered. Informed consent was confirmed. A procedural timeout was called and completed per institutional policy. Once appropriate monitors were applied, procedural MAC anesthesia was induced and maintained by a staff anesthesiologist. Once an appropriate level of sedation was achieved, a transesophageal echocardiogram probe was inserted into the esophagus with ease.  A comprehensive TEE study was completed and the full report available in the chart.  There was no evidence of spontaneous echo contrast or thrombus in the left atrial appendage.  The patient was converted to sinus rhythm with pads across the anterior and posterior chest wall with a successful 200 J synchronized cardioversion. The patient awoke from the procedure without overt complications.    Post Procedure Diagnosis: No LAA appendage thrombus, Normal sinus rhythm.    Plan:  -Discharge home.  -Continue current medicines.   -Consider Tikosyn vs AF ablation as outpatient.  -Consider ICD if EF does not improve.    Elisabeth Pigeon. Nanine Means, MD, [place]  Clinical Cardiac Electrophysiology  [facility]    12/18/2017  11:52 AM

## 2017-12-18 NOTE — Anesthesia Post-Procedure Evaluation (Signed)
Procedure(s):  TEE/ CARDIOVERSION.    total IV anesthesia    Anesthesia Post Evaluation      Multimodal analgesia: multimodal analgesia used between 6 hours prior to anesthesia start to PACU discharge  Patient location during evaluation: PACU  Patient participation: complete - patient participated  Level of consciousness: awake and alert  Pain management: adequate  Airway patency: patent  Anesthetic complications: no  Cardiovascular status: acceptable and hemodynamically stable  Respiratory status: acceptable  Hydration status: acceptable        Vitals Value Taken Time   BP 134/97 12/18/2017 12:18 PM   Temp     Pulse 71 12/18/2017 12:18 PM   Resp 8 12/18/2017 12:18 PM   SpO2 98 % 12/18/2017 12:18 PM   Vitals shown include unvalidated device data.

## 2017-12-18 NOTE — Procedures (Signed)
Operator: Sapna Padron J. Carliss Quast, MD    Referring: John Cebe, MD    Pre-Procedure Diagnosis  1. Atrial fibrillation  ??  Procedure Performed  1. Transesophageal Echocardiogram  2. Direct Current Cardioversion    Anesthesia: MAC     Specimens: * No specimens in log *     Procedural Description: The patient was brought to the operating suite in a fasting, nonsedated state. The risks, benefits and alternatives of the procedure were reviewed with the patient, and final questions answered. Informed consent was confirmed. A procedural timeout was called and completed per institutional policy. Once appropriate monitors were applied, procedural MAC anesthesia was induced and maintained by a staff anesthesiologist. Once an appropriate level of sedation was achieved, a transesophageal echocardiogram probe was inserted into the esophagus with ease.  A comprehensive TEE study was completed and the full report available in the chart.  There was no evidence of spontaneous echo contrast or thrombus in the left atrial appendage.  The patient was converted to sinus rhythm with pads across the anterior and posterior chest wall with a successful 200 J synchronized cardioversion. The patient awoke from the procedure without overt complications.    Post Procedure Diagnosis: No LAA appendage thrombus, Normal sinus rhythm.    Plan:  -Discharge home.  -Continue current medicines.   -Consider Tikosyn vs AF ablation as outpatient.  -Consider ICD if EF does not improve.    Biddie Sebek J. Aniello Christopoulos, MD, MS  Clinical Cardiac Electrophysiology  [facility]    12/18/2017  11:52 AM

## 2017-12-18 NOTE — Progress Notes (Signed)
Patient awake and alert. Discharge instructions for TEE/CVN reviewed. INT removed with cath intact.

## 2017-12-18 NOTE — Anesthesia Pre-Procedure Evaluation (Signed)
Relevant Problems   No relevant active problems       Anesthetic History   No history of anesthetic complications            Review of Systems / Medical History  Patient summary reviewed and pertinent labs reviewed    Pulmonary  Within defined limits                 Neuro/Psych       CVA: no residual symptoms       Cardiovascular    Hypertension        Dysrhythmias (x2 - on Plavix) : atrial fibrillation  CAD and cardiac stents    Exercise tolerance: >4 METS     GI/Hepatic/Renal  Within defined limits              Endo/Other  Within defined limits           Other Findings              Physical Exam    Airway  Mallampati: III  TM Distance: 4 - 6 cm  Neck ROM: normal range of motion   Mouth opening: Normal     Cardiovascular    Rhythm: irregular  Rate: normal         Dental         Pulmonary  Breath sounds clear to auscultation               Abdominal         Other Findings            Anesthetic Plan    ASA: 3  Anesthesia type: total IV anesthesia          Induction: Intravenous  Anesthetic plan and risks discussed with: Patient and Spouse

## 2017-12-18 NOTE — Anesthesia Post-Procedure Evaluation (Signed)
Procedure(s):  TEE/ CARDIOVERSION.    total IV anesthesia    Anesthesia Post Evaluation      Multimodal analgesia: multimodal analgesia used between 6 hours prior to anesthesia start to PACU discharge  Patient location during evaluation: PACU  Patient participation: complete - patient participated  Level of consciousness: awake and alert  Pain management: adequate  Airway patency: patent  Anesthetic complications: no  Cardiovascular status: acceptable and hemodynamically stable  Respiratory status: acceptable  Hydration status: acceptable        Vitals Value Taken Time   BP 134/97 12/18/2017 12:18 PM   Temp     Pulse 71 12/18/2017 12:18 PM   Resp 8 12/18/2017 12:18 PM   SpO2 98 % 12/18/2017 12:18 PM   Vitals shown include unvalidated device data.

## 2018-01-17 ENCOUNTER — Ambulatory Visit: Payer: Charity | Primary: Family Medicine

## 2018-01-18 ENCOUNTER — Ambulatory Visit: Attending: Cardiovascular Disease | Primary: Adult Health

## 2018-01-18 ENCOUNTER — Ambulatory Visit
Admit: 2018-01-18 | Discharge: 2018-01-18 | Payer: Charity | Attending: Cardiovascular Disease | Primary: Family Medicine

## 2018-01-18 DIAGNOSIS — I4819 Other persistent atrial fibrillation: Secondary | ICD-10-CM

## 2018-01-18 NOTE — Progress Notes (Signed)
Progress Notes by Rhett Bannister, MD at 01/18/18 0915                Author: Rhett Bannister, MD  Service: --  Author Type: Physician       Filed: 01/18/18 0930  Encounter Date: 01/18/2018  Status: Signed          Editor: Rhett Bannister, MD (Physician)                          [facility]   [address], [address]   [place], [place] [address]   PHONE: [phone]            01/18/18            NAME:  [name] [name]   DOB: 1955/08/17   MRN: [id number]       Follow Up       ASSESSMENT and PLAN:   Diagnoses and all orders for this visit:      1. CAD       2. Thrombus of left atrial appendage      3. Essential hypertension      4. Atherosclerosis of native coronary artery of native heart with stable angina pectoris (HCC)      5. Atrial fibrillation with RVR (HCC)      6. Anterior myocardial infarction (HCC)      7. Dyslipidemia      8. Stroke 08/3855       62 year old male with persistent AF with finding of LAA thrombus and reduced EF. I suspect his EF is reduced, at least in part from his AF.  Maintenance of rhythm is ideal but we cannot do this at this time given his presence of LAA thrombus. He underwent TEE/DCCV in 12/2017 and he's been feeling better. We will need to recheck EF prior to making further decisions.      -Continue Eliquis.    -Limited TTE.   -Continue AV nodal blockers. STOP diltiazem.    -Downstream AF ablation and/or possible ICD. If he's improving consider ILR for long term rhythm mgmt.   -Continue wearing LifeVest for now.       AF ABLATION EDUCATION (done today):   I discussed with the patient the pathophysiology of atrial fibrillation, including details about potential triggers from the pulmonary veins (PVs), as well as non-PV sites.  We also discussed that in certain patients (especially those with more persistent  AF) there is often atrial substrate (i.e. fibrosis, dilatation, etc.) that promotes more sustained AF. We also discussed the therapeutic options for treatment  of atrial fibrillation, including:   --Rate control    --Rhythm control with antiarrhythmic drugs (AAD) and supplemental rate control   --Catheter ablation, including pulmonary vein isolation (PVI), with or without additional substrate modification, in attempt to maintain sinus rhythm long-term   --AV nodal ablation with a permanent pacemaker (ablate & pace).      I emphasized to the patient that all of these options require stroke prophylaxis with oral anticoagulation therapy (OAC) with  Coumadin or novel oral anticoagulant (NOACs), depending on risk factors. I explained that successful catheter ablation with  sufficient long-term follow-up documenting a lack of recurrent AF may allow for discontinuation of anticoagulation in the future; however, this has not been proven safe in prospective clinical studies and is still considered experimental.  Data from retrospective  trials, however, has suggested that stopping oral anticoagulation after successful ablation  does not result in increased in stroke rates and appears to be safe.      The benefits and risks of each of these approaches were outlined in detail.  We discussed the variable success rates of AF ablation, which can range from 50-80+%, depending on multiple factors, including paroxysmal vs. persistent AF, duration of AF, AF  burden, left atrial size and remodeling, concomitant valvular or other structural heart disease, non-PV triggers, prior ablation lesion sets, obstructive sleep apnea, and other potential confounding factors.  I also explained that often (approximately  30-50% of the time) patients will require multiple procedures to achieve long-term AF suppression.  Additionally, we discussed that ablation may decrease AF burden and/or symptoms, but additional therapeutic strategies (i.e. concomitant AAD therapy, rate  controlling medications, oral anticoagulants, etc.) may be needed long term for AF management.      The catheter ablation procedure was  described to the patient in detail, including the risks of recurrent AF/AT, need for repeat ablation, esophageal perforation/fistula, pulmonary vein stenosis,  bronchial injury/fistula, stroke, cardiac perforation with  the need for catheter drainage or surgical repair, vascular damage, DVT/PE, bleeding, thermal skin burns, radiation skin injury, kidney failure, pneumo/hemothorax, need for permanent pacemaker, phrenic or vagus nerve damage resulting in diaphragmatic  paralysis or gastroparesis, stiff left atrial syndrome, and even death.       Thank you for allowing me to participate in the electrophysiologic care of [name] [name]. Please contact me if any questions or concerns were to arise.      [name]. Sheena Simonis MD, MS   Clinical Cardiac Electrophysiology   [facility]   01/18/18   3:16 PM      ===================================================================   Chief Complant:       Chief Complaint       Patient presents with        ?  Hospital Follow Up            Consultation is requested by Rhett Bannister for  evaluation of Hospital Follow Up         History:   [name] [name] is a most pleasant 62 y.o.  male with a past medical and cardiac history significant for anterior MI, CVA (May 2019, received TPA) , HTN, CAD, dyslipidemia, AFIB and HTN  who underwent TEE on 11/05/17. He was found to have a reduced EF of 25% with anterior apical akinesis. He was found to have an LA thrombus that was confirmed with definity. Patient was admitted and started on IV heparin with bridge to warfarin.       He comes in for followup. He remains in NSR post TEE/DCCV and he feels well. He is on diltiazem and we will look to stop this. He otherwise is compliant with medical therapy and he's feeling much better. The patient otherwise denies chest pain, dyspnea,  presyncope, syncope or lateralizing symptoms.      Cardiac PMH: (Old records have been reviewed and summarized below)      EKG:  (EKG has been independently  visualized by me with interpretation below): Atrial fibrillation with  mild tachycardia.       ECHO: 11/05/2017   - ??Left ventricle: The ventricle was dilated. Systolic function was markedly  reduced. Ejection fraction was estimated in the range of 15 % to 20 %.     - ??Right ventricle: Systolic function was reduced.    - ??Left atrium: The atrium was dilated.  - ??Left atrial appendage:  There was continuous spontaneous echo contrast   ("smoke") in the appendage. There was thrombus noted in the LAA. Due to  thrombus cardioversion was not performed.    - ??Atrial septum: Appeared to be a PFO by color doppler.     - ??Right atrium: The atrium was dilated.    - ??Mitral valve: There was mild regurgitation.    - ??Tricuspid valve: There was mild regurgitation.    - ??Aorta, systemic arteries: There was mild atheroma.      Previous Heart Catheterization: 09/2010   CORONARY ARTERIOGRAMS   1. Both the right and left coronaries were large and patulous   proximally.   2. The left main coronary was quite large, was huge, and then tapered to   the LAD, circumflex systems.   3. The LAD coronary had evidence of proximal stenting which emanated from   the left main. The stented segment appeared widely patent with a good   inflow and outflow and no significant disruption. No luminal narrowing   and TIMI-3 flow in the distal LAD. There were no significant mid LAD   stenoses.   4. The circumflex coronary artery provided a ramus intermedius and a   moderate mid vessel marginal. The ramus intermedius was tortuous but   appeared free of significant disease. There was a high diagonal as well   which was tortuous and paralleled the ramus. It was small in caliber and   extent, was diffusely irregular but no high-grade stenosis.   5. The circumflex coronary artery contained minor irregularity proximally   and then had a 40-50% stenosis. It was ectatic before it gave rise to the   mid vessel marginal. The mid vessel marginal had 50% stenosis in its    origin. The circumflex after the marginal origin contained an 80%   stenosis but was very limited in the distal circulation. On review of the   left system anatomy. There was no significant change from that previously   in October 2011.   6. The right coronary artery was also patulous and irregular, large   caliber trunk, tortuous with a mid vessel 40% stenosis, with a small   button of ulceration. Then was diffusely irregular throughout its distal   half. There was evidence of previous stenting from the distal right into   the PDA. The stented segment was widely patent. There was good luminal   appearance inflow and outflow to a large PDA. There was a subsegmental   PDA which was patent and had minor irregularity throughout. This was much   smaller in caliber than the primary PDA.   7. 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   ??   IMPRESSION   1. Stable pattern of diffuse coronary disease as described.   2. Mild reduction in left ventricular systolic function with an apical   wall motion abnormality.      Stress Test: n/a       Past Medical History, Past Surgical History, Family history, Social History, and Medications were all reviewed with the patient today and updated as necessary.         Current Outpatient Medications          Medication  Sig  Dispense  Refill           ?  OTHER  antronex daily as needed for allergies         ?  losartan (COZAAR) 25 mg tablet  Take 1 Tab by mouth daily.  30 Tab  11     ?  metoprolol succinate (TOPROL-XL) 100 mg tablet  Take 1 Tab by mouth two (2) times a day.  60 Tab  11     ?  apixaban (ELIQUIS) 5 mg tablet  Take 1 Tab by mouth two (2) times a day.  60 Tab  11     ?  furosemide (LASIX) 40 mg tablet  Take 1 Tab by mouth daily.  30 Tab  11     ?  clopidogrel (PLAVIX) 75 mg tab  Take 1 Tab by mouth daily.  30 Tab  11     ?  potassium chloride (KLOR-CON) 10 mEq tablet   Take 1 Tab by mouth two (2) times a day.  60 Tab  6     ?  nitroglycerin (NITROSTAT) 0.4 mg SL tablet  1 Tab by SubLINGual route as needed for Chest Pain. Up to 3 doses.  1 Bottle  5     ?  dilTIAZem CD (CARDIZEM CD) 120 mg ER capsule  Take 1 Cap by mouth daily.  30 Cap  11           ?  ezetimibe (ZETIA) 10 mg tablet  Take 10 mg by mouth daily.              Allergies        Allergen  Reactions         ?  Aspirin  Hives     ?  Gluten  Other (comments)             Abdominal pain and hip pain         ?  Tylenol [Acetaminophen]  Hives          Patient Active Problem List          Diagnosis        ?  Anterior myocardial infarction [facility])     ?  Essential hypertension, benign     ?  Coronary atherosclerosis of native coronary artery     ?  Dyslipidemia     ?  Atrial fibrillation with RVR (HCC)     ?  Thrombus of left atrial appendage     ?  Chest pain, unspecified     ?  Unstable angina (HCC)     ?  Hypertension        ?  CAD (coronary artery disease)             Past Medical History:        Diagnosis  Date         ?  Atrial fibrillation with RVR (HCC)  11/05/2017     ?  CAD (coronary artery disease)       ?  CVA (cerebral vascular accident) (HCC)  09/05/2017     ?  Dyslipidemia       ?  Hypertension           ?  Unstable angina (HCC)  02/02/2010          Past Surgical History:         Procedure  Laterality  Date          ?  CARDIAC SURG PROCEDURE UNLIST    11/2009          stent x1 LAD           ?  PR  LEFT HEART CATH,PERCUTANEOUS    02/02/2010          stent x1        No family history on file.     Social History          Tobacco Use         ?  Smoking status:  Never Smoker     ?  Smokeless tobacco:  Never Used       Substance Use Topics         ?  Alcohol use:  No           ROS:  A comprehensive review of systems was performed with the pertinent positives and negatives as noted in the HPI in addition to:      Review of Systems    Constitutional: Negative.     HENT: Negative.     Eyes: Negative.     Respiratory: Negative.      Cardiovascular: Negative.     Gastrointestinal: Negative.     Genitourinary: Negative.     Musculoskeletal: Negative.     Skin: Negative.     Neurological: Negative.     Endo/Heme/Allergies: Negative.     Psychiatric/Behavioral: Negative.              PHYSICAL EXAM:       Visit Vitals      BP  144/76     Pulse  (!) 54     Ht  6\' 4"  (1.93 m)     Wt  248 lb (112.5 kg)        BMI  30.19 kg/m??              Wt Readings from Last 3 Encounters:        01/18/18  248 lb (112.5 kg)     12/18/17  245 lb (111.1 kg)        11/19/17  247 lb (112 kg)          BP Readings from Last 3 Encounters:        01/18/18  144/76     12/18/17  (!) 141/97        11/19/17  130/80           Gen: Well appearing, well developed, no acute distress   Eyes: Pupils equal, round. Extraocular movements are intact   ENT: Oropharynx clear, no oral lesions, normal dentition   CV: S1S2, regular rate and rhythm, no murmurs, rubs or gallops, normal JVD, no carotid bruits, normal distal pulses, no LEE   Pulm: Clear to auscultation bilaterally, no accessory muscle uses, no wheezes or rales   GI: Soft, NT, ND, +BS   Neuro: Alert and oriented, nonfocal   Psych: Appropriate affect   Skin: Normal color and skin turgor   MSK: Normal muscle bulk and tone      Medical problems and test results were reviewed with the patient today.       No results found for any visits on 01/18/18.        Lab Results         Component  Value  Date/Time            Potassium  3.9  12/18/2017 09:19 AM          Lab Results         Component  Value  Date/Time            Creatinine  1.00  12/18/2017 09:19 AM  Lab Results         Component  Value  Date/Time            HGB  13.3 (L)  12/18/2017 09:19 AM          Lab Results         Component  Value  Date/Time            INR  1.1  12/18/2017 09:19 AM       INR  1.2  11/07/2017 04:14 AM       INR  1.2  11/06/2017 04:02 AM       Prothrombin time  14.6 (H)  12/18/2017 09:19 AM       Prothrombin time  15.4 (H)  11/07/2017 04:14 AM             Prothrombin time  14.6 (H)  11/06/2017 04:02 AM

## 2018-01-18 NOTE — Progress Notes (Signed)
[facility]  [address], [id number]  [place], [place] [id number]  PHONE: [phone]        01/18/18        NAME:  [name] MaltaJames C [name]  DOB: July 01, 1955  MRN: [id number]     Follow Up     ASSESSMENT and PLAN:  Diagnoses and all orders for this visit:    1. CAD     2. Thrombus of left atrial appendage    3. Essential hypertension    4. Atherosclerosis of native coronary artery of native heart with stable angina pectoris (HCC)    5. Atrial fibrillation with RVR (HCC)    6. Anterior myocardial infarction (HCC)    7. Dyslipidemia    8. Stroke [id number]     62 year old male with persistent AF with finding of LAA thrombus and reduced EF. I suspect his EF is reduced, at least in part from his AF. Maintenance of rhythm is ideal but we cannot do this at this time given his presence of LAA thrombus. He underwent TEE/DCCV in 12/2017 and he's been feeling better. We will need to recheck EF prior to making further decisions.    -Continue Eliquis.   -Limited TTE.  -Continue AV nodal blockers. STOP diltiazem.   -Downstream AF ablation and/or possible ICD. If he's improving consider ILR for long term rhythm mgmt.  -Continue wearing LifeVest for now.     AF ABLATION EDUCATION (done today):  I discussed with the patient the pathophysiology of atrial fibrillation, including details about potential triggers from the pulmonary veins (PVs), as well as non-PV sites.  We also discussed that in certain patients (especially those with more persistent AF) there is often atrial substrate (i.e. fibrosis, dilatation, etc.) that promotes more sustained AF. We also discussed the therapeutic options for treatment of atrial fibrillation, including:  --Rate control   --Rhythm control with antiarrhythmic drugs (AAD) and supplemental rate control  --Catheter ablation, including pulmonary vein isolation (PVI), with or without additional substrate modification, in attempt to maintain sinus rhythm long-term   --AV nodal ablation with a permanent pacemaker (ablate & pace).    I emphasized to the patient that all of these options require stroke prophylaxis with oral anticoagulation therapy (OAC) with  Coumadin or novel oral anticoagulant (NOACs), depending on risk factors. I explained that successful catheter ablation with sufficient long-term follow-up documenting a lack of recurrent AF may allow for discontinuation of anticoagulation in the future; however, this has not been proven safe in prospective clinical studies and is still considered experimental.  Data from retrospective trials, however, has suggested that stopping oral anticoagulation after successful ablation does not result in increased in stroke rates and appears to be safe.    The benefits and risks of each of these approaches were outlined in detail.  We discussed the variable success rates of AF ablation, which can range from 50-80+%, depending on multiple factors, including paroxysmal vs. persistent AF, duration of AF, AF burden, left atrial size and remodeling, concomitant valvular or other structural heart disease, non-PV triggers, prior ablation lesion sets, obstructive sleep apnea, and other potential confounding factors.  I also explained that often (approximately 30-50% of the time) patients will require multiple procedures to achieve long-term AF suppression.  Additionally, we discussed that ablation may decrease AF burden and/or symptoms, but additional therapeutic strategies (i.e. concomitant AAD therapy, rate controlling medications, oral anticoagulants, etc.) may be needed long term for AF management.    The catheter ablation procedure was described  to the patient in detail, including the risks of recurrent AF/AT, need for repeat ablation, esophageal perforation/fistula, pulmonary vein stenosis,  bronchial injury/fistula, stroke, cardiac perforation with the need for catheter  drainage or surgical repair, vascular damage, DVT/PE, bleeding, thermal skin burns, radiation skin injury, kidney failure, pneumo/hemothorax, need for permanent pacemaker, phrenic or vagus nerve damage resulting in diaphragmatic paralysis or gastroparesis, stiff left atrial syndrome, and even death.     Thank you for allowing me to participate in the electrophysiologic care of [name] [name]. Please contact me if any questions or concerns were to arise.    [name]. Selina Tapper MD, MS  Clinical Cardiac Electrophysiology  [facility]  01/18/18  3:16 PM    ===================================================================  Chief Complant:    Chief Complaint   Patient presents with   ??? Hospital Follow Up        Consultation is requested by Rhett Bannister for evaluation of Hospital Follow Up      History:  [name] [name] is a most pleasant 62 y.o. male with a past medical and cardiac history significant for anterior MI, CVA (May 2019, received TPA) , HTN, CAD, dyslipidemia, AFIB and HTN who underwent TEE on 11/05/17. He was found to have a reduced EF of 25% with anterior apical akinesis. He was found to have an LA thrombus that was confirmed with definity. Patient was admitted and started on IV heparin with bridge to warfarin.     He comes in for followup. He remains in NSR post TEE/DCCV and he feels well. He is on diltiazem and we will look to stop this. He otherwise is compliant with medical therapy and he's feeling much better. The patient otherwise denies chest pain, dyspnea, presyncope, syncope or lateralizing symptoms.    Cardiac PMH: (Old records have been reviewed and summarized below)    EKG:  (EKG has been independently visualized by me with interpretation below): Atrial fibrillation with mild tachycardia.     ECHO: 11/05/2017  - ??Left ventricle: The ventricle was dilated. Systolic function was markedly  reduced. Ejection fraction was estimated in the range of 15 % to 20 %.     - ??Right ventricle: Systolic function was reduced.    - ??Left atrium: The atrium was dilated.  - ??Left atrial appendage: There was continuous spontaneous echo contrast  ("smoke") in the appendage. There was thrombus noted in the LAA. Due to  thrombus cardioversion was not performed.    - ??Atrial septum: Appeared to be a PFO by color doppler.    - ??Right atrium: The atrium was dilated.    - ??Mitral valve: There was mild regurgitation.    - ??Tricuspid valve: There was mild regurgitation.    - ??Aorta, systemic arteries: There was mild atheroma.    Previous Heart Catheterization: 09/2010  CORONARY ARTERIOGRAMS  1. Both the right and left coronaries were large and patulous  proximally.  2. The left main coronary was quite large, was huge, and then tapered to  the LAD, circumflex systems.  3. The LAD coronary had evidence of proximal stenting which emanated from  the left main. The stented segment appeared widely patent with a good  inflow and outflow and no significant disruption. No luminal narrowing  and TIMI-3 flow in the distal LAD. There were no significant mid LAD  stenoses.  4. The circumflex coronary artery provided a ramus intermedius and a  moderate mid vessel marginal. The ramus intermedius was tortuous but  appeared free of  significant disease. There was a high diagonal as well  which was tortuous and paralleled the ramus. It was small in caliber and  extent, was diffusely irregular but no high-grade stenosis.  5. The circumflex coronary artery contained minor irregularity proximally  and then had a 40-50% stenosis. It was ectatic before it gave rise to the  mid vessel marginal. The mid vessel marginal had 50% stenosis in its  origin. The circumflex after the marginal origin contained an 80%  stenosis but was very limited in the distal circulation. On review of the  left system anatomy. There was no significant change from that previously  in October 2011.   6. The right coronary artery was also patulous and irregular, large  caliber trunk, tortuous with a mid vessel 40% stenosis, with a small  button of ulceration. Then was diffusely irregular throughout its distal  half. There was evidence of previous stenting from the distal right into  the PDA. The stented segment was widely patent. There was good luminal  appearance inflow and outflow to a large PDA. There was a subsegmental  PDA which was patent and had minor irregularity throughout. This was much  smaller in caliber than the primary PDA.  7. 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  ??  IMPRESSION  1. Stable pattern of diffuse coronary disease as described.  2. Mild reduction in left ventricular systolic function with an apical  wall motion abnormality.    Stress Test: n/a     Past Medical History, Past Surgical History, Family history, Social History, and Medications were all reviewed with the patient today and updated as necessary.     Current Outpatient Medications   Medication Sig Dispense Refill   ??? OTHER antronex daily as needed for allergies     ??? losartan (COZAAR) 25 mg tablet Take 1 Tab by mouth daily. 30 Tab 11   ??? metoprolol succinate (TOPROL-XL) 100 mg tablet Take 1 Tab by mouth two (2) times a day. 60 Tab 11   ??? apixaban (ELIQUIS) 5 mg tablet Take 1 Tab by mouth two (2) times a day. 60 Tab 11   ??? furosemide (LASIX) 40 mg tablet Take 1 Tab by mouth daily. 30 Tab 11   ??? clopidogrel (PLAVIX) 75 mg tab Take 1 Tab by mouth daily. 30 Tab 11   ??? potassium chloride (KLOR-CON) 10 mEq tablet Take 1 Tab by mouth two (2) times a day. 60 Tab 6   ??? nitroglycerin (NITROSTAT) 0.4 mg SL tablet 1 Tab by SubLINGual route as needed for Chest Pain. Up to 3 doses. 1 Bottle 5   ??? dilTIAZem CD (CARDIZEM CD) 120 mg ER capsule Take 1 Cap by mouth daily. 30 Cap 11    ??? ezetimibe (ZETIA) 10 mg tablet Take 10 mg by mouth daily.       Allergies   Allergen Reactions   ??? Aspirin Hives   ??? Gluten Other (comments)     Abdominal pain and hip pain   ??? Tylenol [Acetaminophen] Hives     Patient Active Problem List    Diagnosis   ??? Anterior myocardial infarction [facility])   ??? Essential hypertension, benign   ??? Coronary atherosclerosis of native coronary artery   ??? Dyslipidemia   ??? Atrial fibrillation with RVR (HCC)   ??? Thrombus of left atrial appendage   ??? Chest pain, unspecified   ???  Unstable angina (HCC)   ??? Hypertension   ??? CAD (coronary artery disease)       Past Medical History:   Diagnosis Date   ??? Atrial fibrillation with RVR (HCC) 11/05/2017   ??? CAD (coronary artery disease)    ??? CVA (cerebral vascular accident) (HCC) 09/05/2017   ??? Dyslipidemia    ??? Hypertension    ??? Unstable angina (HCC) 02/02/2010     Past Surgical History:   Procedure Laterality Date   ??? CARDIAC SURG PROCEDURE UNLIST  11/2009    stent x1 LAD    ??? PR LEFT HEART CATH,PERCUTANEOUS  02/02/2010    stent x1     No family history on file.  Social History     Tobacco Use   ??? Smoking status: Never Smoker   ??? Smokeless tobacco: Never Used   Substance Use Topics   ??? Alcohol use: No       ROS:  A comprehensive review of systems was performed with the pertinent positives and negatives as noted in the HPI in addition to:    Review of Systems   Constitutional: Negative.    HENT: Negative.    Eyes: Negative.    Respiratory: Negative.    Cardiovascular: Negative.    Gastrointestinal: Negative.    Genitourinary: Negative.    Musculoskeletal: Negative.    Skin: Negative.    Neurological: Negative.    Endo/Heme/Allergies: Negative.    Psychiatric/Behavioral: Negative.          PHYSICAL EXAM:     Visit Vitals  BP 144/76   Pulse (!) 54   Ht 6\' 4"  (1.93 m)   Wt 248 lb (112.5 kg)   BMI 30.19 kg/m??        Wt Readings from Last 3 Encounters:   01/18/18 248 lb (112.5 kg)   12/18/17 245 lb (111.1 kg)   11/19/17 247 lb (112 kg)      BP Readings from Last 3 Encounters:   01/18/18 144/76   12/18/17 (!) 141/97   11/19/17 130/80       Gen: Well appearing, well developed, no acute distress  Eyes: Pupils equal, round. Extraocular movements are intact  ENT: Oropharynx clear, no oral lesions, normal dentition  CV: S1S2, regular rate and rhythm, no murmurs, rubs or gallops, normal JVD, no carotid bruits, normal distal pulses, no LEE  Pulm: Clear to auscultation bilaterally, no accessory muscle uses, no wheezes or rales  GI: Soft, NT, ND, +BS  Neuro: Alert and oriented, nonfocal  Psych: Appropriate affect  Skin: Normal color and skin turgor  MSK: Normal muscle bulk and tone    Medical problems and test results were reviewed with the patient today.     No results found for any visits on 01/18/18.    Lab Results   Component Value Date/Time    Potassium 3.9 12/18/2017 09:19 AM     Lab Results   Component Value Date/Time    Creatinine 1.00 12/18/2017 09:19 AM     Lab Results   Component Value Date/Time    HGB 13.3 (L) 12/18/2017 09:19 AM     Lab Results   Component Value Date/Time    INR 1.1 12/18/2017 09:19 AM    INR 1.2 11/07/2017 04:14 AM    INR 1.2 11/06/2017 04:02 AM    Prothrombin time 14.6 (H) 12/18/2017 09:19 AM    Prothrombin time 15.4 (H) 11/07/2017 04:14 AM    Prothrombin time 14.6 (H) 11/06/2017 04:02 AM

## 2018-01-18 NOTE — Patient Instructions (Signed)
Dilated Cardiomyopathy: Care Instructions  Your Care Instructions    Dilated cardiomyopathy is a condition that weakens your heart muscle and causes it to stretch, or dilate. When your heart muscle is weak, it can't pump out blood as well as it should. More blood stays in your heart after each heartbeat. As more blood fills and stays in the heart, the heart muscle stretches even more and gets even weaker.  Many things can cause dilated cardiomyopathy. It can be caused by another disease or condition, such as high blood pressure or a heart attack. Some people have a family history of dilated cardiomyopathy. For some people, the cause is not known.  You may not have any symptoms at first. Or you may have mild symptoms, such as feeling very tired or weak. If your heart gets weaker, you may develop heart failure. Heart failure means that your heart muscle doesn't pump as much blood as your body needs. If this happens, you will feel other symptoms such as shortness of breath or trouble breathing when you lie down.  The goal of treatment is to slow the disease and help you feel better. You may also have treatment for the cause of the cardiomyopathy. You will probably take a few medicines. If your doctor thinks it will help your heart and prevent problems, you may get a device such as a pacemaker. Self-care is another important part of your treatment. It includes the things you can do every day to feel better and stay as healthy as possible.  Follow-up care is a key part of your treatment and safety. Be sure to make and go to all appointments, and call your doctor if you are having problems. It's also a good idea to know your test results and keep a list of the medicines you take.  How can you care for yourself at home?  Medicines  ?? ?? Be safe with medicines. Take your medicines exactly as prescribed. Call your doctor if you think you are having a problem with your medicine.  You may be taking some of the following medicines:  ? Angiotensin-converting enzyme (ACE) inhibitors or angiotensin II receptor blockers (ARBs). These make it easier for blood to flow.  ? Diuretics. These help remove excess fluid from the body.  ? Beta-blockers. These slow the heart rate and can help the heart fill with blood more completely.   ??Heart-healthy lifestyle  ?? ?? Be active. Exercise regularly, but don't exercise too hard. If you aren't already active, your doctor may want you to start exercising. But don't start until you have talked with your doctor to make an exercise program that is safe for you.   ?? ?? Do not smoke. Smoking can make a heart condition worse. If you need help quitting, talk to your doctor about stop-smoking programs and medicines. These can increase your chances of quitting for good.   ?? ?? Eat a heart-healthy diet.   ?? ?? Stay at a healthy weight. Lose weight if you need to.   ?? ?? If your doctor recommends it, limit sodium. This helps keep fluid from building up in your body. It may help you feel better.   ??Weight monitoring  ?? ?? Weigh yourself without clothing at the same time each day. Record your weight. Call your doctor if you have a sudden weight gain, such as more than 2 to 3 pounds in a day or 5 pounds in a week. (Your doctor may suggest a different range of weight   gain.) A sudden weight gain may mean that your condition is getting worse.   When should you call for help?  Call 911 anytime you think you may need emergency care. For example, call if:  ?? ?? You have symptoms of sudden heart failure. These may include:  ? Severe trouble breathing.  ? A fast or irregular heartbeat.  ? Coughing up pink, foamy mucus.  ? Passing out.   ??Call your doctor now or seek immediate medical care if:  ?? ?? You have new or changed symptoms of heart failure, such as:  ? New or increased shortness of breath.  ? New or worse swelling in your legs, ankles, or feet.   ? Sudden weight gain, such as more than 2 to 3 pounds in a day or 5 pounds in a week. (Your doctor may suggest a different range of weight gain.)  ? Feeling dizzy or lightheaded or like you may faint.  ? Feeling so tired or weak that you cannot do your usual activities.  ? Not sleeping well. Shortness of breath wakes you at night. You need extra pillows to prop yourself up to breathe easier.   ??Watch closely for changes in your health, and be sure to contact your doctor if you have any problems.  Where can you learn more?  Go to http://www.healthwise.net/[employer].  Enter B164 in the search box to learn more about "Dilated Cardiomyopathy: Care Instructions."  Current as of: July 24, 2017  Content Version: 12.2  ?? 2006-2019 [employer]. Care instructions adapted under license by [employer] (which disclaims liability or warranty for this information). If you have questions about a medical condition or this instruction, always ask your healthcare professional. [employer] disclaims any warranty or liability for your use of this information.

## 2018-03-04 ENCOUNTER — Encounter: Primary: Family Medicine

## 2018-03-11 ENCOUNTER — Encounter

## 2018-03-11 ENCOUNTER — Institutional Professional Consult (permissible substitution): Admit: 2018-03-11 | Discharge: 2018-03-11 | Payer: Charity | Primary: Family Medicine

## 2018-03-11 DIAGNOSIS — I4819 Other persistent atrial fibrillation: Secondary | ICD-10-CM

## 2018-03-11 NOTE — Progress Notes (Signed)
Echo completed. See interpretation scanned to the order.

## 2018-03-20 ENCOUNTER — Ambulatory Visit: Attending: Cardiovascular Disease | Primary: Adult Health

## 2018-03-20 ENCOUNTER — Ambulatory Visit
Admit: 2018-03-20 | Discharge: 2018-03-20 | Payer: Charity | Attending: Cardiovascular Disease | Primary: Family Medicine

## 2018-03-20 DIAGNOSIS — I10 Essential (primary) hypertension: Secondary | ICD-10-CM

## 2018-03-20 NOTE — Progress Notes (Signed)
Progress Notes by Rhett Bannister, MD at 03/20/18 1045                Author: Rhett Bannister, MD  Service: --  Author Type: Physician       Filed: 03/20/18 1141  Encounter Date: 03/20/2018  Status: Signed          Editor: Rhett Bannister, MD (Physician)                          [facility]   [address], [address]   [place], [place] [address]   PHONE: [phone]            03/20/18            NAME:  [name] [name]   DOB: Jan 18, 1956   MRN: [id number]       Follow Up       ASSESSMENT and PLAN:   Diagnoses and all orders for this visit:      1. CAD       2. Thrombus of left atrial appendage      3. Essential hypertension      4. Atherosclerosis of native coronary artery of native heart with stable angina pectoris (HCC)      5. Atrial fibrillation with RVR (HCC)      6. Anterior myocardial infarction (HCC)      7. Dyslipidemia      8. Stroke 08/6054       62 year old male with persistent AF with finding of LAA thrombus and reduced EF. I suspect his EF is reduced, at least in part from his AF.  Maintenance of rhythm is ideal but we cannot do this at this time given his presence of LAA thrombus. He underwent TEE/DCCV in 12/2017 and he's been feeling better. He is now back in AF but did feel better when he was in NSR. Now interested in the ablation  procedure.       -Continue Eliquis.    -Back in AF, pt now interested in ablation.   -Limited TTE shows mild improvement in EF to 30-35%.    -Continue GDMT.    -Downstream AF ablation and/or possible ICD. If he's improving consider ILR for long term rhythm mgmt.   .Pt off LIfeVest.        AF ABLATION EDUCATION (done today):   I discussed with the patient the pathophysiology of atrial fibrillation, including details about potential triggers from the pulmonary veins (PVs), as well as non-PV sites.  We also discussed that in certain patients (especially those with more persistent  AF) there is often atrial substrate (i.e. fibrosis, dilatation, etc.) that  promotes more sustained AF. We also discussed the therapeutic options for treatment of atrial fibrillation, including:   --Rate control    --Rhythm control with antiarrhythmic drugs (AAD) and supplemental rate control   --Catheter ablation, including pulmonary vein isolation (PVI), with or without additional substrate modification, in attempt to maintain sinus rhythm long-term   --AV nodal ablation with a permanent pacemaker (ablate & pace).      I emphasized to the patient that all of these options require stroke prophylaxis with oral anticoagulation therapy (OAC) with  Coumadin or novel oral anticoagulant (NOACs), depending on risk factors. I explained that successful catheter ablation with  sufficient long-term follow-up documenting a lack of recurrent AF may allow for discontinuation of anticoagulation in the future; however, this has not been proven safe  in prospective clinical studies and is still considered experimental.  Data from retrospective  trials, however, has suggested that stopping oral anticoagulation after successful ablation does not result in increased in stroke rates and appears to be safe.      The benefits and risks of each of these approaches were outlined in detail.  We discussed the variable success rates of AF ablation, which can range from 50-80+%, depending on multiple factors, including paroxysmal vs. persistent AF, duration of AF, AF  burden, left atrial size and remodeling, concomitant valvular or other structural heart disease, non-PV triggers, prior ablation lesion sets, obstructive sleep apnea, and other potential confounding factors.  I also explained that often (approximately  30-50% of the time) patients will require multiple procedures to achieve long-term AF suppression.  Additionally, we discussed that ablation may decrease AF burden and/or symptoms, but additional therapeutic strategies (i.e. concomitant AAD therapy, rate  controlling medications, oral anticoagulants, etc.)  may be needed long term for AF management.      The catheter ablation procedure was described to the patient in detail, including the risks of recurrent AF/AT, need for repeat ablation, esophageal perforation/fistula, pulmonary vein stenosis,  bronchial injury/fistula, stroke, cardiac perforation with  the need for catheter drainage or surgical repair, vascular damage, DVT/PE, bleeding, thermal skin burns, radiation skin injury, kidney failure, pneumo/hemothorax, need for permanent pacemaker, phrenic or vagus nerve damage resulting in diaphragmatic  paralysis or gastroparesis, stiff left atrial syndrome, and even death.       Thank you for allowing me to participate in the electrophysiologic care of Mr. [name] [name]. Please contact me if any questions or concerns were to arise.      [name]. Gleen Ripberger MD, MS   Clinical Cardiac Electrophysiology   [facility]   03/20/18   3:16 PM      ===================================================================   Chief Complant:       Chief Complaint       Patient presents with        ?  Irregular Heart Beat             2 month             Consultation is requested by Rhett Bannister for  evaluation of Irregular Heart Beat (2 month )         History:   [name] [name] is a most pleasant 62 y.o.  male with a past medical and cardiac history significant for anterior MI, CVA (May 2019, received TPA) , HTN, CAD, dyslipidemia, AFIB and HTN  who underwent TEE on 11/05/17. He was found to have a reduced EF of 25% with anterior apical akinesis. He was found to have an LA thrombus that was confirmed with definity. Patient was admitted and started on IV heparin with bridge to warfarin.       He comes in for followup. He remains in NSR post TEE/DCCV and he feels well. His EF has improved mildly to 30-35%. He is now back in AF with RVR. The patient otherwise denies chest pain, dyspnea, presyncope, syncope or lateralizing symptoms.      Cardiac PMH: (Old records have been reviewed  and summarized below)      EKG:  (EKG has been independently visualized by me with interpretation below): Atrial fibrillation with  mild tachycardia.       ECHO: 11/05/2017   - ??Left ventricle: The ventricle was dilated. Systolic function was markedly  reduced. Ejection fraction was estimated in  the range of 15 % to 20 %.     - ??Right ventricle: Systolic function was reduced.    - ??Left atrium: The atrium was dilated.  - ??Left atrial appendage: There was continuous spontaneous echo contrast   ("smoke") in the appendage. There was thrombus noted in the LAA. Due to  thrombus cardioversion was not performed.    - ??Atrial septum: Appeared to be a PFO by color doppler.     - ??Right atrium: The atrium was dilated.    - ??Mitral valve: There was mild regurgitation.    - ??Tricuspid valve: There was mild regurgitation.    - ??Aorta, systemic arteries: There was mild atheroma.      Previous Heart Catheterization: 09/2010   CORONARY ARTERIOGRAMS   1. Both the right and left coronaries were large and patulous   proximally.   2. The left main coronary was quite large, was huge, and then tapered to   the LAD, circumflex systems.   3. The LAD coronary had evidence of proximal stenting which emanated from   the left main. The stented segment appeared widely patent with a good   inflow and outflow and no significant disruption. No luminal narrowing   and TIMI-3 flow in the distal LAD. There were no significant mid LAD   stenoses.   4. The circumflex coronary artery provided a ramus intermedius and a   moderate mid vessel marginal. The ramus intermedius was tortuous but   appeared free of significant disease. There was a high diagonal as well   which was tortuous and paralleled the ramus. It was small in caliber and   extent, was diffusely irregular but no high-grade stenosis.   5. The circumflex coronary artery contained minor irregularity proximally   and then had a 40-50% stenosis. It was ectatic before it gave rise to the   mid  vessel marginal. The mid vessel marginal had 50% stenosis in its   origin. The circumflex after the marginal origin contained an 80%   stenosis but was very limited in the distal circulation. On review of the   left system anatomy. There was no significant change from that previously   in October 2011.   6. The right coronary artery was also patulous and irregular, large   caliber trunk, tortuous with a mid vessel 40% stenosis, with a small   button of ulceration. Then was diffusely irregular throughout its distal   half. There was evidence of previous stenting from the distal right into   the PDA. The stented segment was widely patent. There was good luminal   appearance inflow and outflow to a large PDA. There was a subsegmental   PDA which was patent and had minor irregularity throughout. This was much   smaller in caliber than the primary PDA.   7. 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   ??   IMPRESSION   1. Stable pattern of diffuse coronary disease as described.   2. Mild reduction in left ventricular systolic function with an apical   wall motion abnormality.      Stress Test: n/a       Past Medical History, Past Surgical History, Family history, Social History, and Medications were all reviewed with the patient today and updated as necessary.         Current Outpatient Medications  Medication  Sig  Dispense  Refill           ?  OTHER  antronex daily as needed for allergies         ?  losartan (COZAAR) 25 mg tablet  Take 1 Tab by mouth daily.  30 Tab  11     ?  metoprolol succinate (TOPROL-XL) 100 mg tablet  Take 1 Tab by mouth two (2) times a day.  60 Tab  11     ?  apixaban (ELIQUIS) 5 mg tablet  Take 1 Tab by mouth two (2) times a day.  60 Tab  11     ?  furosemide (LASIX) 40 mg tablet  Take 1 Tab by mouth daily.  30 Tab  11     ?  clopidogrel (PLAVIX) 75 mg tab  Take 1 Tab by mouth  daily.  30 Tab  11     ?  potassium chloride (KLOR-CON) 10 mEq tablet  Take 1 Tab by mouth two (2) times a day.  60 Tab  6     ?  nitroglycerin (NITROSTAT) 0.4 mg SL tablet  1 Tab by SubLINGual route as needed for Chest Pain. Up to 3 doses.  1 Bottle  5           ?  ezetimibe (ZETIA) 10 mg tablet  Take 10 mg by mouth daily.              Allergies        Allergen  Reactions         ?  Aspirin  Hives     ?  Gluten  Other (comments)             Abdominal pain and hip pain         ?  Tylenol [Acetaminophen]  Hives          Patient Active Problem List          Diagnosis        ?  Anterior myocardial infarction [facility])     ?  Essential hypertension, benign     ?  Coronary atherosclerosis of native coronary artery     ?  Dyslipidemia     ?  Dilated cardiomyopathy (HCC)     ?  Atrial fibrillation with RVR (HCC)     ?  Thrombus of left atrial appendage     ?  Chest pain, unspecified     ?  Unstable angina (HCC)     ?  Hypertension        ?  CAD (coronary artery disease)             Past Medical History:        Diagnosis  Date         ?  Atrial fibrillation with RVR (HCC)  11/05/2017     ?  CAD (coronary artery disease)       ?  CVA (cerebral vascular accident) (HCC)  09/05/2017     ?  Dyslipidemia       ?  Hypertension           ?  Unstable angina (HCC)  02/02/2010          Past Surgical History:         Procedure  Laterality  Date          ?  CARDIAC SURG PROCEDURE UNLIST    11/2009  stent x1 LAD           ?  PR LEFT HEART CATH,PERCUTANEOUS    02/02/2010          stent x1        No family history on file.     Social History          Tobacco Use         ?  Smoking status:  Never Smoker     ?  Smokeless tobacco:  Never Used       Substance Use Topics         ?  Alcohol use:  No           ROS:  A comprehensive review of systems was performed with the pertinent positives and negatives as noted in the HPI in addition to:      Review of Systems    Constitutional: Negative.     HENT: Negative.     Eyes: Negative.      Respiratory: Negative.     Cardiovascular: Negative.     Gastrointestinal: Negative.     Genitourinary: Negative.     Musculoskeletal: Negative.     Skin: Negative.     Neurological: Negative.     Endo/Heme/Allergies: Negative.     Psychiatric/Behavioral: Negative.              PHYSICAL EXAM:       Visit Vitals      Pulse  96     Ht  6\' 4"  (1.93 m)     Wt  248 lb (112.5 kg)        BMI  30.19 kg/m??              Wt Readings from Last 3 Encounters:        03/20/18  248 lb (112.5 kg)     01/18/18  248 lb (112.5 kg)        12/18/17  245 lb (111.1 kg)          BP Readings from Last 3 Encounters:        01/18/18  144/76     12/18/17  (!) 141/97        11/19/17  130/80           Gen: Well appearing, well developed, no acute distress   Eyes: Pupils equal, round. Extraocular movements are intact   ENT: Oropharynx clear, no oral lesions, normal dentition   CV: S1S2, regular rate and rhythm, no murmurs, rubs or gallops, normal JVD, no carotid bruits, normal distal pulses, no LEE   Pulm: Clear to auscultation bilaterally, no accessory muscle uses, no wheezes or rales   GI: Soft, NT, ND, +BS   Neuro: Alert and oriented, nonfocal   Psych: Appropriate affect   Skin: Normal color and skin turgor   MSK: Normal muscle bulk and tone      Medical problems and test results were reviewed with the patient today.       No results found for any visits on 03/20/18.        Lab Results         Component  Value  Date/Time            Potassium  3.9  12/18/2017 09:19 AM          Lab Results         Component  Value  Date/Time  Creatinine  1.00  12/18/2017 09:19 AM          Lab Results         Component  Value  Date/Time            HGB  13.3 (L)  12/18/2017 09:19 AM          Lab Results         Component  Value  Date/Time            INR  1.1  12/18/2017 09:19 AM       INR  1.2  11/07/2017 04:14 AM       INR  1.2  11/06/2017 04:02 AM       Prothrombin time  14.6 (H)  12/18/2017 09:19 AM       Prothrombin time  15.4 (H)  11/07/2017 04:14 AM             Prothrombin time  14.6 (H)  11/06/2017 04:02 AM

## 2018-03-20 NOTE — Progress Notes (Signed)
[facility]  [address], [address]  [place], [place] [address]  PHONE: [phone]        03/20/18        NAME:  [name] [name]  DOB: 06-Mar-1956  MRN: [id number]     Follow Up     ASSESSMENT and PLAN:  Diagnoses and all orders for this visit:    1. CAD     2. Thrombus of left atrial appendage    3. Essential hypertension    4. Atherosclerosis of native coronary artery of native heart with stable angina pectoris (HCC)    5. Atrial fibrillation with RVR (HCC)    6. Anterior myocardial infarction (HCC)    7. Dyslipidemia    8. Stroke 08/2554     62 year old male with persistent AF with finding of LAA thrombus and reduced EF. I suspect his EF is reduced, at least in part from his AF. Maintenance of rhythm is ideal but we cannot do this at this time given his presence of LAA thrombus. He underwent TEE/DCCV in 12/2017 and he's been feeling better. He is now back in AF but did feel better when he was in NSR. Now interested in the ablation procedure.     -Continue Eliquis.   -Back in AF, pt now interested in ablation.  -Limited TTE shows mild improvement in EF to 30-35%.   -Continue GDMT.   -Downstream AF ablation and/or possible ICD. If he's improving consider ILR for long term rhythm mgmt.  .Pt off LIfeVest.      AF ABLATION EDUCATION (done today):  I discussed with the patient the pathophysiology of atrial fibrillation, including details about potential triggers from the pulmonary veins (PVs), as well as non-PV sites.  We also discussed that in certain patients (especially those with more persistent AF) there is often atrial substrate (i.e. fibrosis, dilatation, etc.) that promotes more sustained AF. We also discussed the therapeutic options for treatment of atrial fibrillation, including:  --Rate control   --Rhythm control with antiarrhythmic drugs (AAD) and supplemental rate control  --Catheter ablation, including pulmonary vein isolation (PVI), with or  without additional substrate modification, in attempt to maintain sinus rhythm long-term  --AV nodal ablation with a permanent pacemaker (ablate & pace).    I emphasized to the patient that all of these options require stroke prophylaxis with oral anticoagulation therapy (OAC) with  Coumadin or novel oral anticoagulant (NOACs), depending on risk factors. I explained that successful catheter ablation with sufficient long-term follow-up documenting a lack of recurrent AF may allow for discontinuation of anticoagulation in the future; however, this has not been proven safe in prospective clinical studies and is still considered experimental.  Data from retrospective trials, however, has suggested that stopping oral anticoagulation after successful ablation does not result in increased in stroke rates and appears to be safe.    The benefits and risks of each of these approaches were outlined in detail.  We discussed the variable success rates of AF ablation, which can range from 50-80+%, depending on multiple factors, including paroxysmal vs. persistent AF, duration of AF, AF burden, left atrial size and remodeling, concomitant valvular or other structural heart disease, non-PV triggers, prior ablation lesion sets, obstructive sleep apnea, and other potential confounding factors.  I also explained that often (approximately 30-50% of the time) patients will require multiple procedures to achieve long-term AF suppression.  Additionally, we discussed that ablation may decrease AF burden and/or symptoms, but additional therapeutic strategies (i.e. concomitant AAD therapy,  rate controlling medications, oral anticoagulants, etc.) may be needed long term for AF management.    The catheter ablation procedure was described to the patient in detail, including the risks of recurrent AF/AT, need for repeat ablation, esophageal perforation/fistula, pulmonary vein stenosis,  bronchial  injury/fistula, stroke, cardiac perforation with the need for catheter drainage or surgical repair, vascular damage, DVT/PE, bleeding, thermal skin burns, radiation skin injury, kidney failure, pneumo/hemothorax, need for permanent pacemaker, phrenic or vagus nerve damage resulting in diaphragmatic paralysis or gastroparesis, stiff left atrial syndrome, and even death.     Thank you for allowing me to participate in the electrophysiologic care of Mr. [name] [name]. Please contact me if any questions or concerns were to arise.    [name]. Canesha Tesfaye MD, MS  Clinical Cardiac Electrophysiology  [facility]  03/20/18  3:16 PM    ===================================================================  Chief Complant:    Chief Complaint   Patient presents with   ??? Irregular Heart Beat     2 month         Consultation is requested by Rhett Bannister for evaluation of Irregular Heart Beat (2 month )      History:  [name] [name] is a most pleasant 62 y.o. male with a past medical and cardiac history significant for anterior MI, CVA (May 2019, received TPA) , HTN, CAD, dyslipidemia, AFIB and HTN who underwent TEE on 11/05/17. He was found to have a reduced EF of 25% with anterior apical akinesis. He was found to have an LA thrombus that was confirmed with definity. Patient was admitted and started on IV heparin with bridge to warfarin.     He comes in for followup. He remains in NSR post TEE/DCCV and he feels well. His EF has improved mildly to 30-35%. He is now back in AF with RVR. The patient otherwise denies chest pain, dyspnea, presyncope, syncope or lateralizing symptoms.    Cardiac PMH: (Old records have been reviewed and summarized below)    EKG:  (EKG has been independently visualized by me with interpretation below): Atrial fibrillation with mild tachycardia.     ECHO: 11/05/2017  - ??Left ventricle: The ventricle was dilated. Systolic function was markedly   reduced. Ejection fraction was estimated in the range of 15 % to 20 %.    - ??Right ventricle: Systolic function was reduced.    - ??Left atrium: The atrium was dilated.  - ??Left atrial appendage: There was continuous spontaneous echo contrast  ("smoke") in the appendage. There was thrombus noted in the LAA. Due to  thrombus cardioversion was not performed.    - ??Atrial septum: Appeared to be a PFO by color doppler.    - ??Right atrium: The atrium was dilated.    - ??Mitral valve: There was mild regurgitation.    - ??Tricuspid valve: There was mild regurgitation.    - ??Aorta, systemic arteries: There was mild atheroma.    Previous Heart Catheterization: 09/2010  CORONARY ARTERIOGRAMS  1. Both the right and left coronaries were large and patulous  proximally.  2. The left main coronary was quite large, was huge, and then tapered to  the LAD, circumflex systems.  3. The LAD coronary had evidence of proximal stenting which emanated from  the left main. The stented segment appeared widely patent with a good  inflow and outflow and no significant disruption. No luminal narrowing  and TIMI-3 flow in the distal LAD. There were no significant mid LAD  stenoses.  4.  The circumflex coronary artery provided a ramus intermedius and a  moderate mid vessel marginal. The ramus intermedius was tortuous but  appeared free of significant disease. There was a high diagonal as well  which was tortuous and paralleled the ramus. It was small in caliber and  extent, was diffusely irregular but no high-grade stenosis.  5. The circumflex coronary artery contained minor irregularity proximally  and then had a 40-50% stenosis. It was ectatic before it gave rise to the  mid vessel marginal. The mid vessel marginal had 50% stenosis in its  origin. The circumflex after the marginal origin contained an 80%  stenosis but was very limited in the distal circulation. On review of the   left system anatomy. There was no significant change from that previously  in October 2011.  6. The right coronary artery was also patulous and irregular, large  caliber trunk, tortuous with a mid vessel 40% stenosis, with a small  button of ulceration. Then was diffusely irregular throughout its distal  half. There was evidence of previous stenting from the distal right into  the PDA. The stented segment was widely patent. There was good luminal  appearance inflow and outflow to a large PDA. There was a subsegmental  PDA which was patent and had minor irregularity throughout. This was much  smaller in caliber than the primary PDA.  7. 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  ??  IMPRESSION  1. Stable pattern of diffuse coronary disease as described.  2. Mild reduction in left ventricular systolic function with an apical  wall motion abnormality.    Stress Test: n/a     Past Medical History, Past Surgical History, Family history, Social History, and Medications were all reviewed with the patient today and updated as necessary.     Current Outpatient Medications   Medication Sig Dispense Refill   ??? OTHER antronex daily as needed for allergies     ??? losartan (COZAAR) 25 mg tablet Take 1 Tab by mouth daily. 30 Tab 11   ??? metoprolol succinate (TOPROL-XL) 100 mg tablet Take 1 Tab by mouth two (2) times a day. 60 Tab 11   ??? apixaban (ELIQUIS) 5 mg tablet Take 1 Tab by mouth two (2) times a day. 60 Tab 11   ??? furosemide (LASIX) 40 mg tablet Take 1 Tab by mouth daily. 30 Tab 11   ??? clopidogrel (PLAVIX) 75 mg tab Take 1 Tab by mouth daily. 30 Tab 11   ??? potassium chloride (KLOR-CON) 10 mEq tablet Take 1 Tab by mouth two (2) times a day. 60 Tab 6   ??? nitroglycerin (NITROSTAT) 0.4 mg SL tablet 1 Tab by SubLINGual route as needed for Chest Pain. Up to 3 doses. 1 Bottle 5    ??? ezetimibe (ZETIA) 10 mg tablet Take 10 mg by mouth daily.       Allergies   Allergen Reactions   ??? Aspirin Hives   ??? Gluten Other (comments)     Abdominal pain and hip pain   ??? Tylenol [Acetaminophen] Hives     Patient Active Problem List    Diagnosis   ??? Anterior myocardial infarction [facility])   ??? Essential hypertension, benign   ??? Coronary atherosclerosis of native coronary artery   ??? Dyslipidemia   ??? Dilated cardiomyopathy (HCC)   ??? Atrial fibrillation with RVR (HCC)   ??? Thrombus of left  atrial appendage   ??? Chest pain, unspecified   ??? Unstable angina (HCC)   ??? Hypertension   ??? CAD (coronary artery disease)       Past Medical History:   Diagnosis Date   ??? Atrial fibrillation with RVR (HCC) 11/05/2017   ??? CAD (coronary artery disease)    ??? CVA (cerebral vascular accident) (HCC) 09/05/2017   ??? Dyslipidemia    ??? Hypertension    ??? Unstable angina (HCC) 02/02/2010     Past Surgical History:   Procedure Laterality Date   ??? CARDIAC SURG PROCEDURE UNLIST  11/2009    stent x1 LAD    ??? PR LEFT HEART CATH,PERCUTANEOUS  02/02/2010    stent x1     No family history on file.  Social History     Tobacco Use   ??? Smoking status: Never Smoker   ??? Smokeless tobacco: Never Used   Substance Use Topics   ??? Alcohol use: No       ROS:  A comprehensive review of systems was performed with the pertinent positives and negatives as noted in the HPI in addition to:    Review of Systems   Constitutional: Negative.    HENT: Negative.    Eyes: Negative.    Respiratory: Negative.    Cardiovascular: Negative.    Gastrointestinal: Negative.    Genitourinary: Negative.    Musculoskeletal: Negative.    Skin: Negative.    Neurological: Negative.    Endo/Heme/Allergies: Negative.    Psychiatric/Behavioral: Negative.          PHYSICAL EXAM:     Visit Vitals  Pulse 96   Ht 6\' 4"  (1.93 m)   Wt 248 lb (112.5 kg)   BMI 30.19 kg/m??        Wt Readings from Last 3 Encounters:   03/20/18 248 lb (112.5 kg)   01/18/18 248 lb (112.5 kg)    12/18/17 245 lb (111.1 kg)     BP Readings from Last 3 Encounters:   01/18/18 144/76   12/18/17 (!) 141/97   11/19/17 130/80       Gen: Well appearing, well developed, no acute distress  Eyes: Pupils equal, round. Extraocular movements are intact  ENT: Oropharynx clear, no oral lesions, normal dentition  CV: S1S2, regular rate and rhythm, no murmurs, rubs or gallops, normal JVD, no carotid bruits, normal distal pulses, no LEE  Pulm: Clear to auscultation bilaterally, no accessory muscle uses, no wheezes or rales  GI: Soft, NT, ND, +BS  Neuro: Alert and oriented, nonfocal  Psych: Appropriate affect  Skin: Normal color and skin turgor  MSK: Normal muscle bulk and tone    Medical problems and test results were reviewed with the patient today.     No results found for any visits on 03/20/18.    Lab Results   Component Value Date/Time    Potassium 3.9 12/18/2017 09:19 AM     Lab Results   Component Value Date/Time    Creatinine 1.00 12/18/2017 09:19 AM     Lab Results   Component Value Date/Time    HGB 13.3 (L) 12/18/2017 09:19 AM     Lab Results   Component Value Date/Time    INR 1.1 12/18/2017 09:19 AM    INR 1.2 11/07/2017 04:14 AM    INR 1.2 11/06/2017 04:02 AM    Prothrombin time 14.6 (H) 12/18/2017 09:19 AM    Prothrombin time 15.4 (H) 11/07/2017 04:14 AM    Prothrombin time 14.6 (H)  11/06/2017 04:02 AM

## 2018-03-20 NOTE — Patient Instructions (Signed)
Atrial Fibrillation: Care Instructions  Your Care Instructions    Atrial fibrillation is an irregular and often fast heartbeat. Treating this condition is important for several reasons. It can cause blood clots, which can travel from your heart to your brain and cause a stroke. If you have a fast heartbeat, you may feel lightheaded, dizzy, and weak. An irregular heartbeat can also increase your risk for heart failure.  Atrial fibrillation is often the result of another heart condition, such as high blood pressure or coronary artery disease. Making changes to improve your heart condition will help you stay healthy and active.  Follow-up care is a key part of your treatment and safety. Be sure to make and go to all appointments, and call your doctor if you are having problems. It's also a good idea to know your test results and keep a list of the medicines you take.  How can you care for yourself at home?  Medicines  ?? ?? Take your medicines exactly as prescribed. Call your doctor if you think you are having a problem with your medicine. You will get more details on the specific medicines your doctor prescribes.   ?? ?? If your doctor has given you a blood thinner to prevent a stroke, be sure you get instructions about how to take your medicine safely. Blood thinners can cause serious bleeding problems.   ?? ?? Do not take any vitamins, over-the-counter drugs, or herbal products without talking to your doctor first.   ??Lifestyle changes  ?? ?? Do not smoke. Smoking can increase your chance of a stroke and heart attack. If you need help quitting, talk to your doctor about stop-smoking programs and medicines. These can increase your chances of quitting for good.   ?? ?? Eat a heart-healthy diet.   ?? ?? Stay at a healthy weight. Lose weight if you need to.   ?? ?? Limit alcohol to 2 drinks a day for men and 1 drink a day for women. Too much alcohol can cause health problems.    ?? ?? Avoid colds and flu. Get a pneumococcal vaccine shot. If you have had one before, ask your doctor whether you need another dose. Get a flu shot every year. If you must be around people with colds or flu, wash your hands often.   Activity  ?? ?? If your doctor recommends it, get more exercise. Walking is a good choice. Bit by bit, increase the amount you walk every day. Try for at least 30 minutes on most days of the week. You also may want to swim, bike, or do other activities. Your doctor may suggest that you join a cardiac rehabilitation program so that you can have help increasing your physical activity safely.   ?? ?? Start light exercise if your doctor says it is okay. Even a small amount will help you get stronger, have more energy, and manage stress. Walking is an easy way to get exercise. Start out by walking a little more than you did in the hospital. Gradually increase the amount you walk.   ?? ?? When you exercise, watch for signs that your heart is working too hard. You are pushing too hard if you cannot talk while you are exercising. If you become short of breath or dizzy or have chest pain, sit down and rest immediately.   ?? ?? Check your pulse regularly. Place two fingers on the artery at the palm side of your wrist, in line with your thumb.   If your heartbeat seems uneven or fast, talk to your doctor.   When should you call for help?  Call 911 anytime you think you may need emergency care. For example, call if:  ?? ?? You have symptoms of a heart attack. These may include:  ? Chest pain or pressure, or a strange feeling in the chest.  ? Sweating.  ? Shortness of breath.  ? Nausea or vomiting.  ? Pain, pressure, or a strange feeling in the back, neck, jaw, or upper belly or in one or both shoulders or arms.  ? Lightheadedness or sudden weakness.  ? A fast or irregular heartbeat.  After you call 911, the operator may tell you to chew 1 adult-strength or  2 to 4 low-dose aspirin. Wait for an ambulance. Do not try to drive yourself.   ?? ?? You have symptoms of a stroke. These may include:  ? Sudden numbness, tingling, weakness, or loss of movement in your face, arm, or leg, especially on only one side of your body.  ? Sudden vision changes.  ? Sudden trouble speaking.  ? Sudden confusion or trouble understanding simple statements.  ? Sudden problems with walking or balance.  ? A sudden, severe headache that is different from past headaches.   ?? ?? You passed out (lost consciousness).   ??Call your doctor now or seek immediate medical care if:  ?? ?? You have new or increased shortness of breath.   ?? ?? You feel dizzy or lightheaded, or you feel like you may faint.   ?? ?? Your heart rate becomes irregular.   ?? ?? You can feel your heart flutter in your chest or skip heartbeats. Tell your doctor if these symptoms are new or worse.   ??Watch closely for changes in your health, and be sure to contact your doctor if you have any problems.  Where can you learn more?  Go to http://www.healthwise.net/[employer].  Enter U020 in the search box to learn more about "Atrial Fibrillation: Care Instructions."  Current as of: July 24, 2017  Content Version: 12.2  ?? 2006-2019 [employer]. Care instructions adapted under license by [employer] (which disclaims liability or warranty for this information). If you have questions about a medical condition or this instruction, always ask your healthcare professional. [employer] disclaims any warranty or liability for your use of this information.

## 2018-04-24 ENCOUNTER — Ambulatory Visit: Payer: Charity | Primary: Family Medicine

## 2018-06-03 NOTE — Progress Notes (Signed)
Patient pre-assessment complete for Atrial fib ablation with Dr Devona Konig scheduled for 06/04/18 at 7:30am, arrival time 6am. Patient verified using DOB. Patient instructed to bring all home medications in labeled bottles on the day of procedure. NPO status reinforced.  Patient instructed to HOLD eliquis, losartan & lasix the am of prpocedure. Instructed they can take all other medications excluding vitamins & supplements. Patient verbalizes understanding of all instructions & denies any questions at this time.

## 2018-06-03 NOTE — Progress Notes (Signed)
Patient pre-assessment complete for Atrial fib ablation with Dr Senfield scheduled for 06/04/18 at 7:30am, arrival time 6am. Patient verified using DOB. Patient instructed to bring all home medications in labeled bottles on the day of procedure. NPO status reinforced.  Patient instructed to HOLD eliquis, losartan & lasix the am of prpocedure. Instructed they can take all other medications excluding vitamins & supplements. Patient verbalizes understanding of all instructions & denies any questions at this time.

## 2018-06-04 ENCOUNTER — Ambulatory Visit: Admit: 2018-06-04 | Payer: Charity | Primary: Family Medicine

## 2018-06-04 ENCOUNTER — Inpatient Hospital Stay: Payer: Charity

## 2018-06-04 LAB — EKG, 12 LEAD, INITIAL
Atrial Rate: 357 {beats}/min
Calculated R Axis: -51 degrees
Calculated T Axis: 109 degrees
Q-T Interval: 384 ms
QRS Duration: 106 ms
QTC Calculation (Bezet): 432 ms
Ventricular Rate: 76 {beats}/min

## 2018-06-04 LAB — CBC W/O DIFF
ABSOLUTE NRBC: 0 10*3/uL (ref 0.0–0.2)
HCT: 48.3 % (ref 41.1–50.3)
HGB: 15.9 g/dL (ref 13.6–17.2)
MCH: 29.8 PG (ref 26.1–32.9)
MCHC: 32.9 g/dL (ref 31.4–35.0)
MCV: 90.4 FL (ref 79.6–97.8)
MPV: 11.3 FL (ref 9.4–12.3)
PLATELET: 194 10*3/uL (ref 150–450)
RBC: 5.34 M/uL (ref 4.23–5.6)
RDW: 14.3 % (ref 11.9–14.6)
WBC: 6.5 10*3/uL (ref 4.3–11.1)

## 2018-06-04 LAB — METABOLIC PANEL, BASIC
Anion gap: 6 mmol/L — ABNORMAL LOW (ref 7–16)
BUN: 14 MG/DL (ref 8–23)
CO2: 28 mmol/L (ref 21–32)
Calcium: 9.4 MG/DL (ref 8.3–10.4)
Chloride: 106 mmol/L (ref 98–107)
Creatinine: 1.11 MG/DL (ref 0.8–1.5)
GFR est AA: >60 mL/min/{1.73_m2} (ref >60)
GFR est non-AA: >60 mL/min/{1.73_m2} (ref >60)
Glucose: 106 mg/dL — ABNORMAL HIGH (ref 65–100)
Potassium: 4.2 mmol/L (ref 3.5–5.1)
Sodium: 140 mmol/L (ref 136–145)

## 2018-06-04 LAB — PROTHROMBIN TIME + INR
INR: 1
Prothrombin time: 13.3 s (ref 12.0–14.7)

## 2018-06-04 LAB — MAGNESIUM
Magnesium: 2.2 mg/dL (ref 1.8–2.4)
Magnesium: 2.2 mg/dL (ref 1.8–2.4)

## 2018-06-04 LAB — CBC
Hematocrit: 48.3 % (ref 41.1–50.3)
Hemoglobin: 15.9 g/dL (ref 13.6–17.2)
MCH: 29.8 PG (ref 26.1–32.9)
MCHC: 32.9 g/dL (ref 31.4–35.0)
MCV: 90.4 FL (ref 79.6–97.8)
MPV: 11.3 FL (ref 9.4–12.3)
NRBC Absolute: 0 10*3/uL (ref 0.0–0.2)
Platelets: 194 10*3/uL (ref 150–450)
RBC: 5.34 M/uL (ref 4.23–5.6)
RDW: 14.3 % (ref 11.9–14.6)
WBC: 6.5 10*3/uL (ref 4.3–11.1)

## 2018-06-04 LAB — BASIC METABOLIC PANEL
Anion Gap: 6 mmol/L — ABNORMAL LOW (ref 7–16)
BUN: 14 MG/DL (ref 8–23)
CO2: 28 mmol/L (ref 21–32)
Calcium: 9.4 MG/DL (ref 8.3–10.4)
Chloride: 106 mmol/L (ref 98–107)
Creatinine: 1.11 MG/DL (ref 0.8–1.5)
EGFR IF NonAfrican American: >60 mL/min/{1.73_m2} (ref >60)
GFR African American: >60 mL/min/{1.73_m2} (ref >60)
Glucose: 106 mg/dL — ABNORMAL HIGH (ref 65–100)
Potassium: 4.2 mmol/L (ref 3.5–5.1)
Sodium: 140 mmol/L (ref 136–145)

## 2018-06-04 LAB — EKG 12-LEAD
Atrial Rate: 357 {beats}/min
Q-T Interval: 384 ms
QRS Duration: 106 ms
QTc Calculation (Bazett): 432 ms
R Axis: -51 degrees
T Axis: 109 degrees
Ventricular Rate: 76 {beats}/min

## 2018-06-04 LAB — PROTIME-INR
INR: 1
Protime: 13.3 s (ref 12.0–14.7)

## 2018-06-04 MED ORDER — ADENOSINE (DIAGNOSTIC) 3 MG/ML IV SOLN
3 mg/mL | Freq: Once | INTRAVENOUS | Status: DC
Start: 2018-06-04 — End: 2018-06-04

## 2018-06-04 MED ORDER — LIDOCAINE HCL 1 % (10 MG/ML) IJ SOLN
10 mg/mL (1 %) | INTRAMUSCULAR | Status: DC | PRN
Start: 2018-06-04 — End: 2018-06-04

## 2018-06-04 MED ORDER — FENTANYL CITRATE (PF) 50 MCG/ML IJ SOLN
50 mcg/mL | Freq: Once | INTRAMUSCULAR | Status: DC
Start: 2018-06-04 — End: 2018-06-04

## 2018-06-04 MED ORDER — MIDAZOLAM 1 MG/ML IJ SOLN
1 mg/mL | Freq: Once | INTRAMUSCULAR | Status: DC
Start: 2018-06-04 — End: 2018-06-04

## 2018-06-04 MED ORDER — DEXAMETHASONE SODIUM PHOSPHATE 4 MG/ML IJ SOLN
4 mg/mL | INTRAMUSCULAR | Status: DC | PRN
Start: 2018-06-04 — End: 2018-06-04
  Administered 2018-06-04: 13:00:00 via INTRAVENOUS

## 2018-06-04 MED ORDER — LACTATED RINGERS IV
INTRAVENOUS | Status: DC
Start: 2018-06-04 — End: 2018-06-04

## 2018-06-04 MED ORDER — ONDANSETRON (PF) 4 MG/2 ML INJECTION
4 mg/2 mL | INTRAMUSCULAR | Status: DC | PRN
Start: 2018-06-04 — End: 2018-06-04
  Administered 2018-06-04: 14:00:00 via INTRAVENOUS

## 2018-06-04 MED ORDER — HYDROMORPHONE (PF) 2 MG/ML IJ SOLN
2 mg/mL | INTRAMUSCULAR | Status: DC | PRN
Start: 2018-06-04 — End: 2018-06-04

## 2018-06-04 MED ORDER — EPHEDRINE (PF) 50 MG/5 ML (10 MG/ML) IN NS IV SYRINGE
50 mg/5 mL (10 mg/mL) | INTRAVENOUS | Status: DC | PRN
Start: 2018-06-04 — End: 2018-06-04
  Administered 2018-06-04: 14:00:00 via INTRAVENOUS

## 2018-06-04 MED ORDER — PROPOFOL 10 MG/ML IV EMUL
10 mg/mL | INTRAVENOUS | Status: DC | PRN
Start: 2018-06-04 — End: 2018-06-04
  Administered 2018-06-04: 13:00:00 via INTRAVENOUS

## 2018-06-04 MED ORDER — ESMOLOL 10 MG/ML IV SOLN
100 mg/10 mL (10 mg/mL) | INTRAVENOUS | Status: DC | PRN
Start: 2018-06-04 — End: 2018-06-04
  Administered 2018-06-04 (×4): via INTRAVENOUS

## 2018-06-04 MED ORDER — PROMETHAZINE 25 MG/ML INJECTION
25 mg/mL | INTRAMUSCULAR | Status: DC | PRN
Start: 2018-06-04 — End: 2018-06-04

## 2018-06-04 MED ORDER — OXYCODONE 5 MG TAB
5 mg | Freq: Once | ORAL | Status: DC | PRN
Start: 2018-06-04 — End: 2018-06-04

## 2018-06-04 MED ORDER — HEPARIN (PORCINE) IN NS (PF) 2,000 UNIT/1,000 ML IV
2000 unit/1,000 mL | INTRAVENOUS | Status: DC
Start: 2018-06-04 — End: 2018-06-04

## 2018-06-04 MED ORDER — MIDAZOLAM 1 MG/ML IJ SOLN
1 mg/mL | Freq: Once | INTRAMUSCULAR | Status: DC | PRN
Start: 2018-06-04 — End: 2018-06-04

## 2018-06-04 MED ORDER — ROCURONIUM 10 MG/ML IV
10 mg/mL | INTRAVENOUS | Status: DC | PRN
Start: 2018-06-04 — End: 2018-06-04
  Administered 2018-06-04: 13:00:00 via INTRAVENOUS

## 2018-06-04 MED ORDER — GLYCOPYRROLATE 0.2 MG/ML IJ SOLN
0.2 mg/mL | INTRAMUSCULAR | Status: DC | PRN
Start: 2018-06-04 — End: 2018-06-04
  Administered 2018-06-04 (×4): via INTRAVENOUS

## 2018-06-04 MED ORDER — FENTANYL CITRATE (PF) 50 MCG/ML IJ SOLN
50 mcg/mL | INTRAMUSCULAR | Status: AC
Start: 2018-06-04 — End: ?

## 2018-06-04 MED ORDER — NALOXONE 0.4 MG/ML INJECTION
0.4 mg/mL | INTRAMUSCULAR | Status: DC | PRN
Start: 2018-06-04 — End: 2018-06-04

## 2018-06-04 MED ORDER — PHENYLEPHRINE 10 MG/ML INJECTION
10 mg/mL | INTRAMUSCULAR | Status: DC | PRN
Start: 2018-06-04 — End: 2018-06-04
  Administered 2018-06-04 (×4): via INTRAVENOUS

## 2018-06-04 MED ORDER — LACTATED RINGERS IV
INTRAVENOUS | Status: DC | PRN
Start: 2018-06-04 — End: 2018-06-04
  Administered 2018-06-04 (×2): via INTRAVENOUS

## 2018-06-04 MED ORDER — FENTANYL CITRATE (PF) 50 MCG/ML IJ SOLN
50 mcg/mL | INTRAMUSCULAR | Status: DC | PRN
Start: 2018-06-04 — End: 2018-06-04
  Administered 2018-06-04 (×5): via INTRAVENOUS

## 2018-06-04 MED ORDER — NEOSTIGMINE METHYLSULFATE 3 MG/3 ML (1 MG/ML) IV SYRINGE
3 mg/ mL (1 mg/mL) | INTRAVENOUS | Status: DC | PRN
Start: 2018-06-04 — End: 2018-06-04
  Administered 2018-06-04 (×4): via INTRAVENOUS

## 2018-06-04 MED ORDER — LIDOCAINE (PF) 20 MG/ML (2 %) IJ SOLN
20 mg/mL (2 %) | INTRAMUSCULAR | Status: DC | PRN
Start: 2018-06-04 — End: 2018-06-04
  Administered 2018-06-04: 13:00:00 via INTRAVENOUS

## 2018-06-04 MED FILL — HEPARIN (PORCINE) IN NS (PF) 2,000 UNIT/1,000 ML IV: 2000 unit/1,000 mL | INTRAVENOUS | Qty: 4000

## 2018-06-04 MED FILL — ADENOSINE (DIAGNOSTIC) 3 MG/ML IV SOLN: 3 mg/mL | INTRAVENOUS | Qty: 30

## 2018-06-04 MED FILL — FENTANYL CITRATE (PF) 50 MCG/ML IJ SOLN: 50 mcg/mL | INTRAMUSCULAR | Qty: 4

## 2018-06-04 NOTE — Procedures (Signed)
Pre-Procedure Diagnosis  1. Atrial fibrillation  ??  Procedure Performed  1. Transesophageal Echocardiogram    Anesthesia: General     Specimens: * No specimens in log *     Procedural Description: The patient was brought to the operating suite in a fasting, nonsedated state. The risks, benefits and alternatives of the procedure were reviewed with the patient, and final questions answered. Informed consent was confirmed. A procedural timeout was called and completed per institutional policy. Once appropriate monitors were applied, procedural GA anesthesia was induced and maintained by a staff anesthesiologist. Once an appropriate level of sedation was achieved, a transesophageal echocardiogram probe was inserted into the esophagus with ease.  A comprehensive TEE study was completed and the full report available in the chart.  There was spontaneous echo contrast and a probable thrombus in the left atrial appendage. Definity contrast was used. Due to this, an ablation was not performed. The patient awoke from the procedure without overt complications.    Post Procedure Diagnosis: LAA thrombus, AF ablation cancelled.     Plan: Discharge home.  -Continue Eliquis.  -EP Follow up in 1-2 weeks.  -Consider CRT-D/AVJ ablation in the near future.      [name]. Devona Konig, MD, MS  Clinical Cardiac Electrophysiology  [facility]    06/04/2018  8:53 AM

## 2018-06-04 NOTE — H&P (Signed)
The patient has been examined and the previous clinic note dated, 03/20/2018, has been reviewed and changes have been noted below.     63 year old male with a history of persistent AF with previous LAA thrombus which was found to be dissolved based on TEE from 12/2017. He has remained on Eliquis. He presents for AF ablation. He has undergone informed consent and will proceed with planned ablation procedure under GA.    [name] [name]. [name] Konig, MD, MS  Clinical Cardiac Electrophysiology  [facility]    NAME:  [name] [name]  DOB: Sep 20, 1955  MRN: [id number]   ??  Follow Up   ??  ASSESSMENT and PLAN:  Diagnoses and all orders for this visit:  ??  1. CAD   ??  2. Thrombus of left atrial appendage  ??  3. Essential hypertension  ??  4. Atherosclerosis of native coronary artery of native heart with stable angina pectoris (HCC)  ??  5. Atrial fibrillation with RVR (HCC)  ??  6. Anterior myocardial infarction [facility])  ??  7. Dyslipidemia  ??  8. Stroke 5/481     63 year old male with persistent AF with finding of LAA thrombus and reduced EF. I suspect his EF is reduced, at least in part from his AF.  He underwent TEE/DCCV in 12/2017 and was negative for LAA thrombus. He has been on consistent Eliquis dosing. He is now back in AF but did feel better when he was in NSR. Now interested in the ablation procedure.   ??  -Continue Eliquis.   -Back in AF, pt now interested in ablation.  -Limited TTE shows mild improvement in EF to 30-35%.   -Continue GDMT.   -Downstream AF ablation and/or possible ICD. If he's improving consider ILR for long term rhythm mgmt.  .Pt off LIfeVest.    ??  AF ABLATION EDUCATION (done today):  I discussed with the patient the pathophysiology of atrial fibrillation, including details about potential triggers from the pulmonary veins (PVs), as well as non-PV sites.  We also discussed that in certain patients (especially those with more persistent AF) there is often atrial substrate (i.e. fibrosis, dilatation, etc.) that  promotes more sustained AF. We also discussed the therapeutic options for treatment of atrial fibrillation, including:  --Rate control   --Rhythm control with antiarrhythmic drugs (AAD) and supplemental rate control  --Catheter ablation, including pulmonary vein isolation (PVI), with or without additional substrate modification, in attempt to maintain sinus rhythm long-term  --AV nodal ablation with a permanent pacemaker (ablate & pace).  ??  I emphasized to the patient that all of these options require stroke prophylaxis with oral anticoagulation therapy (OAC) with  Coumadin or novel oral anticoagulant (NOACs), depending on risk factors. I explained that successful catheter ablation with sufficient long-term follow-up documenting a lack of recurrent AF may allow for discontinuation of anticoagulation in the future; however, this has not been proven safe in prospective clinical studies and is still considered experimental.  Data from retrospective trials, however, has suggested that stopping oral anticoagulation after successful ablation does not result in increased in stroke rates and appears to be safe.  ??  The benefits and risks of each of these approaches were outlined in detail.  We discussed the variable success rates of AF ablation, which can range from 50-80+%, depending on multiple factors, including paroxysmal vs. persistent AF, duration of AF, AF burden, left atrial size and remodeling, concomitant valvular or other structural heart disease, non-PV triggers, prior  ablation lesion sets, obstructive sleep apnea, and other potential confounding factors.  I also explained that often (approximately 30-50% of the time) patients will require multiple procedures to achieve long-term AF suppression.  Additionally, we discussed that ablation may decrease AF burden and/or symptoms, but additional therapeutic strategies (i.e. concomitant AAD therapy, rate controlling medications, oral anticoagulants, etc.) may be  needed long term for AF management.  ??  The catheter ablation procedure was described to the patient in detail, including the risks of recurrent AF/AT, need for repeat ablation, esophageal perforation/fistula, pulmonary vein stenosis,  bronchial injury/fistula, stroke, cardiac perforation with the need for catheter drainage or surgical repair, vascular damage, DVT/PE, bleeding, thermal skin burns, radiation skin injury, kidney failure, pneumo/hemothorax, need for permanent pacemaker, phrenic or vagus nerve damage resulting in diaphragmatic paralysis or gastroparesis, stiff left atrial syndrome, and even death.   Thank you for allowing me to participate in the electrophysiologic care of Mr. [name] [name]. Please contact me if any questions or concerns were to arise.  ??  [name] ButteJeffrey J. Khylee Algeo MD, MS  Clinical Cardiac Electrophysiology  [facility]  03/20/18  3:16 PM  ??  ===================================================================  Chief Complant:         Chief Complaint   Patient presents with   ??? Irregular Heart Beat   ?? ?? 2 month       ??  Consultation is requested by [name] [name] for evaluation of Irregular Heart Beat (2 month )  ??  ??  History:  [name] [name] is a most pleasant 63 y.o. male with a past medical and cardiac history significant for anterior MI, CVA (May 2019, received TPA)??, HTN, CAD, dyslipidemia, AFIB and HTN who underwent TEE on 11/05/17. He was found to have a reduced EF of 25% with anterior apical akinesis. He was found to have an LA thrombus that was confirmed with definity. Patient was admitted and started on IV heparin with bridge to warfarin.   ??  He comes in for followup. He remains in NSR post TEE/DCCV and he feels well. His EF has improved mildly to 30-35%. He is now back in AF with RVR. The patient otherwise denies chest pain, dyspnea, presyncope, syncope or lateralizing symptoms.  ??  Cardiac PMH: (Old records have been reviewed and summarized below)  ??  EKG:  (EKG has been  independently visualized by me with interpretation below): Atrial fibrillation with mild tachycardia.   ??  ECHO: 11/05/2017  - ??Left ventricle: The ventricle was dilated. Systolic function was markedly  reduced. Ejection fraction was estimated in the range of 15 % to 20 %.    - ??Right ventricle: Systolic function was reduced.    - ??Left atrium: The atrium was dilated.  - ??Left atrial appendage: There was continuous spontaneous echo contrast  ("smoke") in the appendage. There was thrombus noted in the LAA. Due to  thrombus cardioversion was not performed.    - ??Atrial septum: Appeared to be a PFO by color doppler.    - ??Right atrium: The atrium was dilated.    - ??Mitral valve: There was mild regurgitation.    - ??Tricuspid valve: There was mild regurgitation.    - ??Aorta, systemic arteries: There was mild atheroma.  ??  Previous Heart Catheterization: 09/2010  CORONARY ARTERIOGRAMS  1. Both the right and left coronaries were large and patulous  proximally.  2. The left main coronary was quite large, was huge, and then tapered to  the LAD, circumflex systems.  3. The LAD coronary had evidence of proximal stenting which emanated from  the left main. The stented segment appeared widely patent with a good  inflow and outflow and no significant disruption. No luminal narrowing  and TIMI-3 flow in the distal LAD. There were no significant mid LAD  stenoses.  4. The circumflex coronary artery provided a ramus intermedius and a  moderate mid vessel marginal. The ramus intermedius was tortuous but  appeared free of significant disease. There was a high diagonal as well  which was tortuous and paralleled the ramus. It was small in caliber and  extent, was diffusely irregular but no high-grade stenosis.  5. The circumflex coronary artery contained minor irregularity proximally  and then had a 40-50% stenosis. It was ectatic before it gave rise to the  mid vessel marginal. The mid vessel marginal had 50% stenosis in its  origin. The  circumflex after the marginal origin contained an 80%  stenosis but was very limited in the distal circulation. On review of the  left system anatomy. There was no significant change from that previously  in October 2011.  6. The right coronary artery was also patulous and irregular, large  caliber trunk, tortuous with a mid vessel 40% stenosis, with a small  button of ulceration. Then was diffusely irregular throughout its distal  half. There was evidence of previous stenting from the distal right into  the PDA. The stented segment was widely patent. There was good luminal  appearance inflow and outflow to a large PDA. There was a subsegmental  PDA which was patent and had minor irregularity throughout. This was much  smaller in caliber than the primary PDA.  7. 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  ??  IMPRESSION  1. Stable pattern of diffuse coronary disease as described.  2. Mild reduction in left ventricular systolic function with an apical  wall motion abnormality.  ??  Stress Test: n/a   ??  Past Medical History, Past Surgical History, Family history, Social History, and Medications were all reviewed with the patient today and updated as necessary.   ??         Current Outpatient Medications   Medication Sig Dispense Refill   ??? OTHER antronex daily as needed for allergies ?? ??   ??? losartan (COZAAR) 25 mg tablet Take 1 Tab by mouth daily. 30 Tab 11   ??? metoprolol succinate (TOPROL-XL) 100 mg tablet Take 1 Tab by mouth two (2) times a day. 60 Tab 11   ??? apixaban (ELIQUIS) 5 mg tablet Take 1 Tab by mouth two (2) times a day. 60 Tab 11   ??? furosemide (LASIX) 40 mg tablet Take 1 Tab by mouth daily. 30 Tab 11   ??? clopidogrel (PLAVIX) 75 mg tab Take 1 Tab by mouth daily. 30 Tab 11   ??? potassium chloride (KLOR-CON) 10 mEq tablet Take 1 Tab by mouth two (2) times a day. 60 Tab 6   ??? nitroglycerin (NITROSTAT) 0.4  mg SL tablet 1 Tab by SubLINGual route as needed for Chest Pain. Up to 3 doses. 1 Bottle 5   ??? ezetimibe (ZETIA) 10 mg tablet Take 10 mg by mouth daily. ?? ??   ??        Allergies   Allergen Reactions   ??? Aspirin Hives   ??? Gluten Other (comments)   ?? ?? Abdominal pain  and hip pain   ??? Tylenol [Acetaminophen] Hives   ??      Patient Active Problem List   ?? Diagnosis   ??? Anterior myocardial infarction [facility])   ??? Essential hypertension, benign   ??? Coronary atherosclerosis of native coronary artery   ??? Dyslipidemia   ??? Dilated cardiomyopathy (HCC)   ??? Atrial fibrillation with RVR (HCC)   ??? Thrombus of left atrial appendage   ??? Chest pain, unspecified   ??? Unstable angina (HCC)   ??? Hypertension   ??? CAD (coronary artery disease)   ??  ??       Past Medical History:   Diagnosis Date   ??? Atrial fibrillation with RVR (HCC) 11/05/2017   ??? CAD (coronary artery disease) ??   ??? CVA (cerebral vascular accident) (HCC) 09/05/2017   ??? Dyslipidemia ??   ??? Hypertension ??   ??? Unstable angina (HCC) 02/02/2010   ??        Past Surgical History:   Procedure Laterality Date   ??? CARDIAC SURG PROCEDURE UNLIST ?? 11/2009   ?? stent x1 LAD    ??? PR LEFT HEART CATH,PERCUTANEOUS ?? 02/02/2010   ?? stent x1   ??  No family history on file.  Social History   ??       Tobacco Use   ??? Smoking status: Never Smoker   ??? Smokeless tobacco: Never Used   Substance Use Topics   ??? Alcohol use: No   ??  ??  ROS:  A comprehensive review of systems was performed with the pertinent positives and negatives as noted in the HPI in addition to:  ??  Review of Systems   Constitutional: Negative.    HENT: Negative.    Eyes: Negative.    Respiratory: Negative.    Cardiovascular: Negative.    Gastrointestinal: Negative.    Genitourinary: Negative.    Musculoskeletal: Negative.    Skin: Negative.    Neurological: Negative.    Endo/Heme/Allergies: Negative.    Psychiatric/Behavioral: Negative.    ??  ??  ??  PHYSICAL EXAM:     Visit Vitals  Pulse 96   Ht 6\' 4"  (1.93 m)   Wt 248 lb (112.5 kg)    BMI 30.19 kg/m??      ??      Wt Readings from Last 3 Encounters:   03/20/18 248 lb (112.5 kg)   01/18/18 248 lb (112.5 kg)   12/18/17 245 lb (111.1 kg)   ??      BP Readings from Last 3 Encounters:   01/18/18 144/76   12/18/17 (!) 141/97   11/19/17 130/80   ??  ??  Gen: Well appearing, well developed, no acute distress  Eyes: Pupils equal, round. Extraocular movements are intact  ENT: Oropharynx clear, no oral lesions, normal dentition  CV: S1S2, regular rate and rhythm, no murmurs, rubs or gallops, normal JVD, no carotid bruits, normal distal pulses, no LEE  Pulm: Clear to auscultation bilaterally, no accessory muscle uses, no wheezes or rales  GI: Soft, NT, ND, +BS  Neuro: Alert and oriented, nonfocal  Psych: Appropriate affect  Skin: Normal color and skin turgor  MSK: Normal muscle bulk and tone  ??  Medical problems and test results were reviewed with the patient today.   ??  No results found for any visits on 03/20/18.  ??        Lab Results   Component Value Date/Time   ?? Potassium 3.9 12/18/2017 09:19 AM   ??  Lab Results   Component Value Date/Time   ?? Creatinine 1.00 12/18/2017 09:19 AM   ??        Lab Results   Component Value Date/Time   ?? HGB 13.3 (L) 12/18/2017 09:19 AM   ??        Lab Results   Component Value Date/Time   ?? INR 1.1 12/18/2017 09:19 AM   ?? INR 1.2 11/07/2017 04:14 AM   ?? INR 1.2 11/06/2017 04:02 AM   ?? Prothrombin time 14.6 (H) 12/18/2017 09:19 AM   ?? Prothrombin time 15.4 (H) 11/07/2017 04:14 AM   ?? Prothrombin time 14.6 (H) 11/06/2017 04:02 AM

## 2018-06-04 NOTE — Anesthesia Pre-Procedure Evaluation (Signed)
Relevant Problems   No relevant active problems       Anesthetic History   No history of anesthetic complications            Review of Systems / Medical History  Patient summary reviewed and pertinent labs reviewed    Pulmonary        Sleep apnea (improved with wt loss)           Neuro/Psych       CVA (5/19, related to A Fib): no residual symptoms       Cardiovascular    Hypertension      CHF (EF 30-35%, well compensated)  Dysrhythmias : atrial fibrillation  CAD and cardiac stents (2011)    Exercise tolerance: >4 METS  Comments: LAA thrombus     GI/Hepatic/Renal  Within defined limits              Endo/Other        Obesity     Other Findings            Physical Exam    Airway  Mallampati: III  TM Distance: 4 - 6 cm  Neck ROM: normal range of motion   Mouth opening: Normal     Cardiovascular    Rhythm: irregular  Rate: normal         Dental  No notable dental hx       Pulmonary  Breath sounds clear to auscultation               Abdominal         Other Findings            Anesthetic Plan    ASA: 3  Anesthesia type: general    Monitoring Plan: Arterial line      Induction: Intravenous  Anesthetic plan and risks discussed with: Patient      Glidescope for intubation.

## 2018-06-04 NOTE — Progress Notes (Signed)
Report received from [facility] PACU RN. Procedural findings communicated. Intra procedural  medication administration reviewed. Progression of care discussed.     Patient received into CPRU [place] [facility] post procedure.     Routine post procedural vital signs  yes

## 2018-06-04 NOTE — Interval H&P Note (Signed)
TRANSFER - OUT REPORT:    Verbal report given to Boneta Lucks, RN on [name] [name]  being transferred to [facility]P [facility] for routine post - op       Report consisted of patient's Situation, Background, Assessment and   Recommendations(SBAR).     Information from the following report(s) OR Summary, Procedure Summary, Intake/Output and MAR was reviewed with the receiving nurse.    Lines:   Peripheral IV 06/04/18 Right Antecubital (Active)   Site Assessment Clean, dry, & intact 06/04/2018  8:56 AM   Phlebitis Assessment 0 06/04/2018  8:56 AM   Infiltration Assessment 0 06/04/2018  8:56 AM   Dressing Status Clean, dry, & intact 06/04/2018  8:56 AM   Dressing Type Tape;Transparent 06/04/2018  8:56 AM   Hub Color/Line Status Pink;Patent 06/04/2018  8:56 AM   Alcohol Cap Used No 06/04/2018  8:56 AM       Peripheral IV 06/04/18 Left Wrist (Active)   Site Assessment Clean, dry, & intact 06/04/2018  8:56 AM   Phlebitis Assessment 0 06/04/2018  8:56 AM   Infiltration Assessment 0 06/04/2018  8:56 AM   Dressing Status Clean, dry, & intact 06/04/2018  8:56 AM   Dressing Type Tape;Transparent 06/04/2018  8:56 AM   Hub Color/Line Status Green;Patent 06/04/2018  8:56 AM   Alcohol Cap Used No 06/04/2018  8:56 AM       Arterial Line 06/04/18 Left Radial artery (Active)   Site Assessment Clean, dry, & intact 06/04/2018  8:56 AM   Dressing Status Clean, dry, & intact 06/04/2018  8:56 AM   Dressing Type Tape;Transparent 06/04/2018  8:56 AM   Line Status Discontinued (Catheter tip intact) 06/04/2018  8:56 AM   Treatment Arm board off 06/04/2018  8:56 AM   Affected Extremity/Extremities Color distal to insertion site pink (or appropriate for race);Pulses palpable;Range of motion performed 06/04/2018  8:56 AM        Opportunity for questions and clarification was provided.      Patient transported with:   Registered Nurse    VTE prophylaxis orders have been written for [name] [name].    Patient and family given floor number and nurses name.  Family updated re:  pt status after security code verified.

## 2018-06-04 NOTE — Anesthesia Post-Procedure Evaluation (Signed)
Procedure(s):  ABLATION OF ATRIAL FIBRILLATION.    general    Anesthesia Post Evaluation        Patient location during evaluation: PACU  Patient participation: complete - patient participated  Level of consciousness: awake  Pain management: satisfactory to patient  Airway patency: patent  Anesthetic complications: no  Cardiovascular status: hemodynamically stable  Respiratory status: spontaneous ventilation  Hydration status: euvolemic  Post anesthesia nausea and vomiting:  none    Case cancelled after induction after finding clot in LAA on TEE.  Vitals Value Taken Time   BP 141/91 06/04/2018  9:23 AM   Temp 36.3 ??C (97.4 ??F) 06/04/2018  8:59 AM   Pulse 85 06/04/2018  9:24 AM   Resp 16 06/04/2018  9:23 AM   SpO2 96 % 06/04/2018  9:24 AM   Vitals shown include unvalidated device data.

## 2018-06-04 NOTE — Progress Notes (Signed)
Patient received to CPRU room # 9  Ambulatory from lobby. Patient scheduled for AFIB ABLN today with Dr Devona Konig. Procedure reviewed & questions answered, voiced good understanding consent obtained & placed on chart. All medications and medical history reviewed. Will prep patient per orders. Patient & family updated on plan of care.      The patient has a fraility score of 3-MANAGING WELL, based on ability to perform ADLs independently.

## 2018-06-04 NOTE — Anesthesia Procedure Notes (Signed)
Arterial Line Placement    Start time: 06/04/2018 8:05 AM  End time: 06/04/2018 8:10 AM  Performed by: Nehemiah Settle, MD  Authorized by: Nehemiah Settle, MD     Pre-Procedure  Indications:  Arterial pressure monitoring and blood sampling  Preanesthetic Checklist: patient identified, risks and benefits discussed, patient being monitored, timeout performed and patient being monitored    Timeout Time: 08:05        Procedure:   Prep:  Chlorhexidine  Seldinger Technique?: Yes    Orientation:  Left  Location:  Radial artery  Catheter size:  20 G  Number of attempts:  2 (2 passes.)  Cont Cardiac Output Sensor: No      Assessment:   Post-procedure:  Sterile dressing applied  Patient Tolerance:  Patient tolerated the procedure well with no immediate complications

## 2018-06-04 NOTE — Progress Notes (Signed)
Patient up to bedside, vital signs stable. Patient ambulated to bathroom without difficulty. Patient voided without difficulty. Discharge instructions and home medications reviewed with patient. Time allowed for questions and answers.    1020  Peripheral IV sites dc'd without difficulty with tip intact.     1020 Patient discharged to home with family.

## 2018-06-04 NOTE — Other (Signed)
TRANSFER - OUT REPORT:    Verbal report given to Boneta Lucks, RN on [name] [name]  being transferred to [facility]P [facility] REC[facility] for routine post - op       Report consisted of patient???s Situation, Background, Assessment and   Recommendations(SBAR).     Information from the following report(s) OR Summary, Procedure Summary, Intake/Output and MAR was reviewed with the receiving nurse.    Lines:   Peripheral IV 06/04/18 Right Antecubital (Active)   Site Assessment Clean, dry, & intact 06/04/2018  8:56 AM   Phlebitis Assessment 0 06/04/2018  8:56 AM   Infiltration Assessment 0 06/04/2018  8:56 AM   Dressing Status Clean, dry, & intact 06/04/2018  8:56 AM   Dressing Type Tape;Transparent 06/04/2018  8:56 AM   Hub Color/Line Status Pink;Patent 06/04/2018  8:56 AM   Alcohol Cap Used No 06/04/2018  8:56 AM       Peripheral IV 06/04/18 Left Wrist (Active)   Site Assessment Clean, dry, & intact 06/04/2018  8:56 AM   Phlebitis Assessment 0 06/04/2018  8:56 AM   Infiltration Assessment 0 06/04/2018  8:56 AM   Dressing Status Clean, dry, & intact 06/04/2018  8:56 AM   Dressing Type Tape;Transparent 06/04/2018  8:56 AM   Hub Color/Line Status Green;Patent 06/04/2018  8:56 AM   Alcohol Cap Used No 06/04/2018  8:56 AM       Arterial Line 06/04/18 Left Radial artery (Active)   Site Assessment Clean, dry, & intact 06/04/2018  8:56 AM   Dressing Status Clean, dry, & intact 06/04/2018  8:56 AM   Dressing Type Tape;Transparent 06/04/2018  8:56 AM   Line Status Discontinued (Catheter tip intact) 06/04/2018  8:56 AM   Treatment Arm board off 06/04/2018  8:56 AM   Affected Extremity/Extremities Color distal to insertion site pink (or appropriate for race);Pulses palpable;Range of motion performed 06/04/2018  8:56 AM        Opportunity for questions and clarification was provided.      Patient transported with:   Registered Nurse    VTE prophylaxis orders have been written for [name] [name].    Patient and family given floor number and nurses name.   Family updated re: pt status after security code verified.

## 2018-06-04 NOTE — Procedures (Signed)
Pre-Procedure Diagnosis  1. Atrial fibrillation  ??  Procedure Performed  1. Transesophageal Echocardiogram    Anesthesia: General     Specimens: * No specimens in log *     Procedural Description: The patient was brought to the operating suite in a fasting, nonsedated state. The risks, benefits and alternatives of the procedure were reviewed with the patient, and final questions answered. Informed consent was confirmed. A procedural timeout was called and completed per institutional policy. Once appropriate monitors were applied, procedural GA anesthesia was induced and maintained by a staff anesthesiologist. Once an appropriate level of sedation was achieved, a transesophageal echocardiogram probe was inserted into the esophagus with ease.  A comprehensive TEE study was completed and the full report available in the chart.  There was spontaneous echo contrast and a probable thrombus in the left atrial appendage. Definity contrast was used. Due to this, an ablation was not performed. The patient awoke from the procedure without overt complications.    Post Procedure Diagnosis: LAA thrombus, AF ablation cancelled.     Plan: Discharge home.  -Continue Eliquis.  -EP Follow up in 1-2 weeks.  -Consider CRT-D/AVJ ablation in the near future.      Natoria Archibald J. Hyman Crossan, MD, MS  Clinical Cardiac Electrophysiology  [facility]    06/04/2018  8:53 AM

## 2018-06-04 NOTE — Anesthesia Post-Procedure Evaluation (Signed)
Procedure(s):  ABLATION OF ATRIAL FIBRILLATION.    general    Anesthesia Post Evaluation        Patient location during evaluation: PACU  Patient participation: complete - patient participated  Level of consciousness: awake  Pain management: satisfactory to patient  Airway patency: patent  Anesthetic complications: no  Cardiovascular status: hemodynamically stable  Respiratory status: spontaneous ventilation  Hydration status: euvolemic  Post anesthesia nausea and vomiting:  none    Case cancelled after induction after finding clot in LAA on TEE.  Vitals Value Taken Time   BP 141/91 06/04/2018  9:23 AM   Temp 36.3 ??C (97.4 ??F) 06/04/2018  8:59 AM   Pulse 85 06/04/2018  9:24 AM   Resp 16 06/04/2018  9:23 AM   SpO2 96 % 06/04/2018  9:24 AM   Vitals shown include unvalidated device data.

## 2018-06-04 NOTE — Progress Notes (Signed)
Patient up to bedside, vital signs stable. Patient ambulated to bathroom without difficulty. Patient voided without difficulty. Discharge instructions and home medications reviewed with patient. Time allowed for questions and answers.    1020  Peripheral IV sites dc'd without difficulty with tip intact.     1020 Patient discharged to home with family.

## 2018-06-04 NOTE — Anesthesia Procedure Notes (Addendum)
Arterial Line Placement    Start time: 06/04/2018 8:05 AM  End time: 06/04/2018 8:10 AM  Performed by: Ophelia Sipe R, MD  Authorized by: Danzell Birky R, MD     Pre-Procedure  Indications:  Arterial pressure monitoring and blood sampling  Preanesthetic Checklist: patient identified, risks and benefits discussed, patient being monitored, timeout performed and patient being monitored    Timeout Time: 08:05        Procedure:   Prep:  Chlorhexidine  Seldinger Technique?: Yes    Orientation:  Left  Location:  Radial artery  Catheter size:  20 G  Number of attempts:  2 (2 passes.)  Cont Cardiac Output Sensor: No      Assessment:   Post-procedure:  Sterile dressing applied  Patient Tolerance:  Patient tolerated the procedure well with no immediate complications

## 2018-06-04 NOTE — H&P (Signed)
The patient has been examined and the previous clinic note dated, 03/20/2018, has been reviewed and changes have been noted below.     63 year old male with a history of persistent AF with previous LAA thrombus which was found to be dissolved based on TEE from 12/2017. He has remained on Eliquis. He presents for AF ablation. He has undergone informed consent and will proceed with planned ablation procedure under GA.    [name]. Devona Konig, MD, MS  Clinical Cardiac Electrophysiology  [facility]    NAME:  [name] [name]  DOB: 1956/04/05  MRN: [id number]   ??  Follow Up   ??  ASSESSMENT and PLAN:  Diagnoses and all orders for this visit:  ??  1. CAD   ??  2. Thrombus of left atrial appendage  ??  3. Essential hypertension  ??  4. Atherosclerosis of native coronary artery of native heart with stable angina pectoris (HCC)  ??  5. Atrial fibrillation with RVR (HCC)  ??  6. Anterior myocardial infarction [facility])  ??  7. Dyslipidemia  ??  8. Stroke [id number]/8042     63 year old male with persistent AF with finding of LAA thrombus and reduced EF. I suspect his EF is reduced, at least in part from his AF.  He underwent TEE/DCCV in 12/2017 and was negative for LAA thrombus. He has been on consistent Eliquis dosing. He is now back in AF but did feel better when he was in NSR. Now interested in the ablation procedure.   ??  -Continue Eliquis.   -Back in AF, pt now interested in ablation.  -Limited TTE shows mild improvement in EF to 30-35%.   -Continue GDMT.   -Downstream AF ablation and/or possible ICD. If he's improving consider ILR for long term rhythm mgmt.  .Pt off LIfeVest.    ??  AF ABLATION EDUCATION (done today):  I discussed with the patient the pathophysiology of atrial fibrillation, including details about potential triggers from the pulmonary veins (PVs), as well as non-PV sites.  We also discussed that in certain patients (especially those with more persistent AF) there is often atrial substrate  (i.e. fibrosis, dilatation, etc.) that promotes more sustained AF. We also discussed the therapeutic options for treatment of atrial fibrillation, including:  --Rate control   --Rhythm control with antiarrhythmic drugs (AAD) and supplemental rate control  --Catheter ablation, including pulmonary vein isolation (PVI), with or without additional substrate modification, in attempt to maintain sinus rhythm long-term  --AV nodal ablation with a permanent pacemaker (ablate & pace).  ??  I emphasized to the patient that all of these options require stroke prophylaxis with oral anticoagulation therapy (OAC) with  Coumadin or novel oral anticoagulant (NOACs), depending on risk factors. I explained that successful catheter ablation with sufficient long-term follow-up documenting a lack of recurrent AF may allow for discontinuation of anticoagulation in the future; however, this has not been proven safe in prospective clinical studies and is still considered experimental.  Data from retrospective trials, however, has suggested that stopping oral anticoagulation after successful ablation does not result in increased in stroke rates and appears to be safe.  ??  The benefits and risks of each of these approaches were outlined in detail.  We discussed the variable success rates of AF ablation, which can range from 50-80+%, depending on multiple factors, including paroxysmal vs. persistent AF, duration of AF, AF burden, left atrial size and remodeling, concomitant valvular or other structural heart disease, non-PV triggers, prior  ablation lesion sets, obstructive sleep apnea, and other potential confounding factors.  I also explained that often (approximately 30-50% of the time) patients will require multiple procedures to achieve long-term AF suppression.  Additionally, we discussed that ablation may decrease AF burden and/or symptoms, but additional therapeutic strategies  (i.e. concomitant AAD therapy, rate controlling medications, oral anticoagulants, etc.) may be needed long term for AF management.  ??  The catheter ablation procedure was described to the patient in detail, including the risks of recurrent AF/AT, need for repeat ablation, esophageal perforation/fistula, pulmonary vein stenosis,  bronchial injury/fistula, stroke, cardiac perforation with the need for catheter drainage or surgical repair, vascular damage, DVT/PE, bleeding, thermal skin burns, radiation skin injury, kidney failure, pneumo/hemothorax, need for permanent pacemaker, phrenic or vagus nerve damage resulting in diaphragmatic paralysis or gastroparesis, stiff left atrial syndrome, and even death.   Thank you for allowing me to participate in the electrophysiologic care of Mr. [name] [name]. Please contact me if any questions or concerns were to arise.  ??  [name]. Eliabeth Shoff MD, MS  Clinical Cardiac Electrophysiology  [facility]  03/20/18  3:16 PM  ??  ===================================================================  Chief Complant:         Chief Complaint   Patient presents with   ??? Irregular Heart Beat   ?? ?? 2 month       ??  Consultation is requested by Rhett Bannister for evaluation of Irregular Heart Beat (2 month )  ??  ??  History:  [name] [name] is a most pleasant 63 y.o. male with a past medical and cardiac history significant for anterior MI, CVA (May 2019, received TPA)??, HTN, CAD, dyslipidemia, AFIB and HTN who underwent TEE on 11/05/17. He was found to have a reduced EF of 25% with anterior apical akinesis. He was found to have an LA thrombus that was confirmed with definity. Patient was admitted and started on IV heparin with bridge to warfarin.   ??  He comes in for followup. He remains in NSR post TEE/DCCV and he feels well. His EF has improved mildly to 30-35%. He is now back in AF with RVR. The patient otherwise denies chest pain, dyspnea, presyncope, syncope or lateralizing symptoms.   ??  Cardiac PMH: (Old records have been reviewed and summarized below)  ??  EKG:  (EKG has been independently visualized by me with interpretation below): Atrial fibrillation with mild tachycardia.   ??  ECHO: 11/05/2017  - ??Left ventricle: The ventricle was dilated. Systolic function was markedly  reduced. Ejection fraction was estimated in the range of 15 % to 20 %.    - ??Right ventricle: Systolic function was reduced.    - ??Left atrium: The atrium was dilated.  - ??Left atrial appendage: There was continuous spontaneous echo contrast  ("smoke") in the appendage. There was thrombus noted in the LAA. Due to  thrombus cardioversion was not performed.    - ??Atrial septum: Appeared to be a PFO by color doppler.    - ??Right atrium: The atrium was dilated.    - ??Mitral valve: There was mild regurgitation.    - ??Tricuspid valve: There was mild regurgitation.    - ??Aorta, systemic arteries: There was mild atheroma.  ??  Previous Heart Catheterization: 09/2010  CORONARY ARTERIOGRAMS  1. Both the right and left coronaries were large and patulous  proximally.  2. The left main coronary was quite large, was huge, and then tapered to  the LAD, circumflex systems.  3. The LAD coronary had evidence of proximal stenting which emanated from  the left main. The stented segment appeared widely patent with a good  inflow and outflow and no significant disruption. No luminal narrowing  and TIMI-3 flow in the distal LAD. There were no significant mid LAD  stenoses.  4. The circumflex coronary artery provided a ramus intermedius and a  moderate mid vessel marginal. The ramus intermedius was tortuous but  appeared free of significant disease. There was a high diagonal as well  which was tortuous and paralleled the ramus. It was small in caliber and  extent, was diffusely irregular but no high-grade stenosis.  5. The circumflex coronary artery contained minor irregularity proximally   and then had a 40-50% stenosis. It was ectatic before it gave rise to the  mid vessel marginal. The mid vessel marginal had 50% stenosis in its  origin. The circumflex after the marginal origin contained an 80%  stenosis but was very limited in the distal circulation. On review of the  left system anatomy. There was no significant change from that previously  in October 2011.  6. The right coronary artery was also patulous and irregular, large  caliber trunk, tortuous with a mid vessel 40% stenosis, with a small  button of ulceration. Then was diffusely irregular throughout its distal  half. There was evidence of previous stenting from the distal right into  the PDA. The stented segment was widely patent. There was good luminal  appearance inflow and outflow to a large PDA. There was a subsegmental  PDA which was patent and had minor irregularity throughout. This was much  smaller in caliber than the primary PDA.  7. 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  ??  IMPRESSION  1. Stable pattern of diffuse coronary disease as described.  2. Mild reduction in left ventricular systolic function with an apical  wall motion abnormality.  ??  Stress Test: n/a   ??  Past Medical History, Past Surgical History, Family history, Social History, and Medications were all reviewed with the patient today and updated as necessary.   ??         Current Outpatient Medications   Medication Sig Dispense Refill   ??? OTHER antronex daily as needed for allergies ?? ??   ??? losartan (COZAAR) 25 mg tablet Take 1 Tab by mouth daily. 30 Tab 11   ??? metoprolol succinate (TOPROL-XL) 100 mg tablet Take 1 Tab by mouth two (2) times a day. 60 Tab 11   ??? apixaban (ELIQUIS) 5 mg tablet Take 1 Tab by mouth two (2) times a day. 60 Tab 11   ??? furosemide (LASIX) 40 mg tablet Take 1 Tab by mouth daily. 30 Tab 11    ??? clopidogrel (PLAVIX) 75 mg tab Take 1 Tab by mouth daily. 30 Tab 11   ??? potassium chloride (KLOR-CON) 10 mEq tablet Take 1 Tab by mouth two (2) times a day. 60 Tab 6   ??? nitroglycerin (NITROSTAT) 0.4 mg SL tablet 1 Tab by SubLINGual route as needed for Chest Pain. Up to 3 doses. 1 Bottle 5   ??? ezetimibe (ZETIA) 10 mg tablet Take 10 mg by mouth daily. ?? ??   ??        Allergies   Allergen Reactions   ??? Aspirin Hives   ??? Gluten Other (comments)   ?? ?? Abdominal pain  and hip pain   ??? Tylenol [Acetaminophen] Hives   ??      Patient Active Problem List   ?? Diagnosis   ??? Anterior myocardial infarction [facility](HCC)   ??? Essential hypertension, benign   ??? Coronary atherosclerosis of native coronary artery   ??? Dyslipidemia   ??? Dilated cardiomyopathy (HCC)   ??? Atrial fibrillation with RVR (HCC)   ??? Thrombus of left atrial appendage   ??? Chest pain, unspecified   ??? Unstable angina (HCC)   ??? Hypertension   ??? CAD (coronary artery disease)   ??  ??       Past Medical History:   Diagnosis Date   ??? Atrial fibrillation with RVR (HCC) 11/05/2017   ??? CAD (coronary artery disease) ??   ??? CVA (cerebral vascular accident) (HCC) 09/05/2017   ??? Dyslipidemia ??   ??? Hypertension ??   ??? Unstable angina (HCC) 02/02/2010   ??        Past Surgical History:   Procedure Laterality Date   ??? CARDIAC SURG PROCEDURE UNLIST ?? 11/2009   ?? stent x1 LAD    ??? PR LEFT HEART CATH,PERCUTANEOUS ?? 02/02/2010   ?? stent x1   ??  No family history on file.  Social History   ??       Tobacco Use   ??? Smoking status: Never Smoker   ??? Smokeless tobacco: Never Used   Substance Use Topics   ??? Alcohol use: No   ??  ??  ROS:  A comprehensive review of systems was performed with the pertinent positives and negatives as noted in the HPI in addition to:  ??  Review of Systems   Constitutional: Negative.    HENT: Negative.    Eyes: Negative.    Respiratory: Negative.    Cardiovascular: Negative.    Gastrointestinal: Negative.    Genitourinary: Negative.    Musculoskeletal: Negative.     Skin: Negative.    Neurological: Negative.    Endo/Heme/Allergies: Negative.    Psychiatric/Behavioral: Negative.    ??  ??  ??  PHYSICAL EXAM:     Visit Vitals  Pulse 96   Ht 6\' 4"  (1.93 m)   Wt 248 lb (112.5 kg)   BMI 30.19 kg/m??      ??      Wt Readings from Last 3 Encounters:   03/20/18 248 lb (112.5 kg)   01/18/18 248 lb (112.5 kg)   12/18/17 245 lb (111.1 kg)   ??      BP Readings from Last 3 Encounters:   01/18/18 144/76   12/18/17 (!) 141/97   11/19/17 130/80   ??  ??  Gen: Well appearing, well developed, no acute distress  Eyes: Pupils equal, round. Extraocular movements are intact  ENT: Oropharynx clear, no oral lesions, normal dentition  CV: S1S2, regular rate and rhythm, no murmurs, rubs or gallops, normal JVD, no carotid bruits, normal distal pulses, no LEE  Pulm: Clear to auscultation bilaterally, no accessory muscle uses, no wheezes or rales  GI: Soft, NT, ND, +BS  Neuro: Alert and oriented, nonfocal  Psych: Appropriate affect  Skin: Normal color and skin turgor  MSK: Normal muscle bulk and tone  ??  Medical problems and test results were reviewed with the patient today.   ??  No results found for any visits on 03/20/18.  ??        Lab Results   Component Value Date/Time   ?? Potassium 3.9 12/18/2017 09:19 AM   ??  Lab Results   Component Value Date/Time   ?? Creatinine 1.00 12/18/2017 09:19 AM   ??        Lab Results   Component Value Date/Time   ?? HGB 13.3 (L) 12/18/2017 09:19 AM   ??        Lab Results   Component Value Date/Time   ?? INR 1.1 12/18/2017 09:19 AM   ?? INR 1.2 11/07/2017 04:14 AM   ?? INR 1.2 11/06/2017 04:02 AM   ?? Prothrombin time 14.6 (H) 12/18/2017 09:19 AM   ?? Prothrombin time 15.4 (H) 11/07/2017 04:14 AM   ?? Prothrombin time 14.6 (H) 11/06/2017 04:02 AM

## 2018-06-04 NOTE — Anesthesia Pre-Procedure Evaluation (Addendum)
Relevant Problems   No relevant active problems       Anesthetic History   No history of anesthetic complications            Review of Systems / Medical History  Patient summary reviewed and pertinent labs reviewed    Pulmonary        Sleep apnea (improved with wt loss)           Neuro/Psych       CVA (5/19, related to A Fib): no residual symptoms       Cardiovascular    Hypertension      CHF (EF 30-35%, well compensated)  Dysrhythmias : atrial fibrillation  CAD and cardiac stents (2011)    Exercise tolerance: >4 METS  Comments: LAA thrombus     GI/Hepatic/Renal  Within defined limits              Endo/Other        Obesity     Other Findings            Physical Exam    Airway  Mallampati: III  TM Distance: 4 - 6 cm  Neck ROM: normal range of motion   Mouth opening: Normal     Cardiovascular    Rhythm: irregular  Rate: normal         Dental  No notable dental hx       Pulmonary  Breath sounds clear to auscultation               Abdominal         Other Findings            Anesthetic Plan    ASA: 3  Anesthesia type: general    Monitoring Plan: Arterial line      Induction: Intravenous  Anesthetic plan and risks discussed with: Patient      Glidescope for intubation.

## 2018-06-04 NOTE — Progress Notes (Signed)
Patient received to CPRU room # 9  Ambulatory from lobby. Patient scheduled for AFIB ABLN today with Dr Senfield. Procedure reviewed & questions answered, voiced good understanding consent obtained & placed on chart. All medications and medical history reviewed. Will prep patient per orders. Patient & family updated on plan of care.      The patient has a fraility score of 3-MANAGING WELL, based on ability to perform ADLs independently.

## 2018-06-04 NOTE — Progress Notes (Signed)
Report received from [facility] PACU RN. Procedural findings communicated. Intra procedural  medication administration reviewed. Progression of care discussed.     Patient received into CPRU [place] [facility] post procedure.     Routine post procedural vital signs  yes

## 2018-06-04 NOTE — Anesthesia Procedure Notes (Deleted)
Arterial Line Placement    Start time: 06/04/2018 8:05 AM  End time: 06/04/2018 8:10 AM  Performed by: Sheran Luz, CRNA  Authorized by: Nehemiah Settle, MD     Pre-Procedure  Indications:  Arterial pressure monitoring and blood sampling  Preanesthetic Checklist: patient identified, risks and benefits discussed, anesthesia consent, site marked, patient being monitored, timeout performed and patient being monitored    Timeout Time: 08:05        Procedure:   Prep:  ChloraPrep  Seldinger Technique?: Yes    Orientation:  Left  Location:  Radial artery  Catheter size:  20 G  Number of attempts:  2  Cont Cardiac Output Sensor: No      Assessment:   Post-procedure:  Sterile dressing applied  Patient Tolerance:  Patient tolerated the procedure well with no immediate complications

## 2018-06-12 ENCOUNTER — Ambulatory Visit: Attending: Cardiovascular Disease | Primary: Family Medicine

## 2018-06-12 ENCOUNTER — Ambulatory Visit
Admit: 2018-06-12 | Discharge: 2018-06-12 | Payer: Charity | Attending: Cardiovascular Disease | Primary: Family Medicine

## 2018-06-12 DIAGNOSIS — I10 Essential (primary) hypertension: Secondary | ICD-10-CM

## 2018-06-12 NOTE — Progress Notes (Signed)
Progress Notes by Corrie Dandy, MD at 06/12/18 1000                Author: Corrie Dandy, MD  Service: --  Author Type: Physician       Filed: 06/12/18 1052  Encounter Date: 06/12/2018  Status: Signed          Editor: Corrie Dandy, MD (Physician)                          [facility]   [address], [address]   [place] [address]   PHONE: [phone]            06/12/18            NAME:  [name] [name]   DOB: 02-Aug-1955   MRN: [id number]       Follow Up       ASSESSMENT and PLAN:   Diagnoses and all orders for this visit:      1. CAD       2. Thrombus of left atrial appendage      3. Essential hypertension      4. Atherosclerosis of native coronary artery of native heart with stable angina pectoris ([place])      5. Atrial fibrillation with RVR ([place])      6. Anterior myocardial infarction ([place])      7. Dyslipidemia      8. Stroke 08/3342       63 year old male with persistent AF with finding of LAA thrombus and reduced EF. I suspect his EF is reduced, at least in part from his AF.  Maintenance of rhythm is ideal but we cannot do this at this time given his presence of LAA thrombus. He underwent TEE/DCCV in 12/2017 and he's been feeling better. He is now back in AF but did feel better when he was in NSR. Now interested in the ablation  procedure.       -Continue Eliquis.    -Back in AF, will plan for CRT-D/AVJ given LAA thrombus and severely dilated LA.    -Continue GDMT.       Procedure to be performed: BIV/AVJ, likely 3 lead device   Device/ablation Company: [employer]   Procedure Date: TBD   Medications to hold, days to hold ([place], AAD): Eliquis, 2 days.   Anesthesia: MAC          The patient has been referred to for an Implantable  Cardioverter- Defibrillator (ICD).  The  cardiologist has discussed and allowed the patient  to ask questions regarding ICD. he was provided with a Shared Decision ICD decision  making tool (booklet) today in clinic.       CRT-D   I discussed in detail the  potential benefits of CRT-D therapy, including improved mortality, prevention of SCD,  decreased hospitalization,  beneficial cardiac remodeling, and prevention of disease progression. Additionally, I discussed with the patient the potential risks of CRT-D implantation, including the risk of bleeding, infection, venous occlusion, DVT/PE, pneumothorax, cardiac tamponade,  perforation, need for urgent open heart surgery, device/lead failure, lead dislodgement, inability to place an LV lead via the coronary sinus, inappropriate shock(s), heart attack, stroke, arrhythmia, radiation skin injury, kidney damage/failure, oversedation,  respiratory arrest, and even death.  The patient understands these risks in the context of the potential benefits of the device implantation, and agrees to proceed.        PM/CRT & AVN  Ablation   I discussed in detail the potential benefits of PM/CRT therapy with AV nodal ablation, including definitive rate control with possible  improved mortality, prevention of SCD,  decreased hospitalization, beneficial cardiac remodeling, and prevention of disease progression. Additionally, I discussed with the patient the potential risks of PM/CRT implantation and subsequent AVN ablation,  including the risk of bleeding, infection, venous occlusion, DVT/PE, pneumothorax, cardiac tamponade, perforation, need for urgent open heart surgery, device/lead failure or lead dislodgement with pacemaker dependence, inability to place an LV lead via  the coronary sinus, inappropriate shock(s), heart attack, stroke, arrhythmia, radiation skin injury, kidney damage/failure, oversedation, respiratory arrest, and even death.  The patient understands these risks in the context of the potential benefits  of the device implantation and subsequent ablation, and agrees to proceed.           DEVICE on [facility]   The patient has persistent AF and is on oral anticoagulation therapy ([place]), and we reviewed the small increased risk of  CVA with DFT testing, especially if [place] is discontinued. The patient  is aware of the small increased  of significant hematoma and bleeding, but we reviewed the recent data suggesting that device implantation on [place] is safe and reasonable in these cases.         TOTAL TIME: 25 minutes, >50% during counseling and coordination of care      .Thank you for allowing me to participate in the electrophysiologic care of [name] [name]. Please contact me if any questions or concerns were to arise.      Elisabeth Pigeon. Ryne Mctigue MD, MS   Clinical Cardiac Electrophysiology   [facility]   06/12/18   3:16 PM      ===================================================================   Chief Complant:       Chief Complaint       Patient presents with        ?  Irregular Heart Beat             hospital follow up            Consultation is requested by Corrie Dandy for  evaluation of Irregular Heart Beat (hospital follow up)         History:   [name] [name] is a most pleasant 63 y.o.  male with a past medical and cardiac history significant for anterior MI, CVA (May 2019, received TPA) , HTN, CAD, dyslipidemia, AFIB and HTN  who underwent TEE on 11/05/17. He was found to have a reduced EF of 25% with anterior apical akinesis. He was found to have an LA thrombus that was confirmed with definity. Patient was admitted and started on IV heparin with bridge to warfarin.       He comes in for followup. He went for AF ablation and has LAA thrombus and markedly dilated LA. We cancelled ablation and discussed CRT-D. AF with RVR today. The patient otherwise denies chest pain, dyspnea, presyncope, syncope or lateralizing symptoms.      Cardiac PMH: (Old records have been reviewed and summarized below)      EKG:  (EKG has been independently visualized by me with interpretation below): Atrial fibrillation with  mild tachycardia.       ECHO: 11/05/2017   - ??Left ventricle: The ventricle was dilated. Systolic function was markedly  reduced.  Ejection fraction was estimated in the range of 15 % to 20 %.     - ??Right ventricle: Systolic function was reduced.    - ??  Left atrium: The atrium was dilated.  - ??Left atrial appendage: There was continuous spontaneous echo contrast   ("smoke") in the appendage. There was thrombus noted in the LAA. Due to  thrombus cardioversion was not performed.    - ??Atrial septum: Appeared to be a PFO by color doppler.     - ??Right atrium: The atrium was dilated.    - ??Mitral valve: There was mild regurgitation.    - ??Tricuspid valve: There was mild regurgitation.    - ??Aorta, systemic arteries: There was mild atheroma.      Previous Heart Catheterization: 09/2010   CORONARY ARTERIOGRAMS   1. Both the right and left coronaries were large and patulous   proximally.   2. The left main coronary was quite large, was huge, and then tapered to   the LAD, circumflex systems.   3. The LAD coronary had evidence of proximal stenting which emanated from   the left main. The stented segment appeared widely patent with a good   inflow and outflow and no significant disruption. No luminal narrowing   and TIMI-3 flow in the distal LAD. There were no significant mid LAD   stenoses.   4. The circumflex coronary artery provided a ramus intermedius and a   moderate mid vessel marginal. The ramus intermedius was tortuous but   appeared free of significant disease. There was a high diagonal as well   which was tortuous and paralleled the ramus. It was small in caliber and   extent, was diffusely irregular but no high-grade stenosis.   5. The circumflex coronary artery contained minor irregularity proximally   and then had a 40-50% stenosis. It was ectatic before it gave rise to the   mid vessel marginal. The mid vessel marginal had 50% stenosis in its   origin. The circumflex after the marginal origin contained an 80%   stenosis but was very limited in the distal circulation. On review of the   left system anatomy. There was no significant change  from that previously   in October 2011.   6. The right coronary artery was also patulous and irregular, large   caliber trunk, tortuous with a mid vessel 40% stenosis, with a small   button of ulceration. Then was diffusely irregular throughout its distal   half. There was evidence of previous stenting from the distal right into   the PDA. The stented segment was widely patent. There was good luminal   appearance inflow and outflow to a large PDA. There was a subsegmental   PDA which was patent and had minor irregularity throughout. This was much   smaller in caliber than the primary PDA.   7. 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   ??   IMPRESSION   1. Stable pattern of diffuse coronary disease as described.   2. Mild reduction in left ventricular systolic function with an apical   wall motion abnormality.      Stress Test: n/a       Past Medical History, Past Surgical History, Family history, Social History, and Medications were all reviewed with the patient today and updated as necessary.         Current Outpatient Medications          Medication  Sig  Dispense  Refill           ?  OTHER  antronex daily as needed for allergies         ?  losartan (COZAAR) 25 mg tablet  Take 1 Tab by mouth daily.  30 Tab  11     ?  metoprolol succinate (TOPROL-XL) 100 mg tablet  Take 1 Tab by mouth two (2) times a day.  60 Tab  11     ?  apixaban (ELIQUIS) 5 mg tablet  Take 1 Tab by mouth two (2) times a day.  60 Tab  11     ?  furosemide (LASIX) 40 mg tablet  Take 1 Tab by mouth daily. (Patient taking differently: Take 40 mg by mouth daily as needed.)  30 Tab  11     ?  clopidogrel (PLAVIX) 75 mg tab  Take 1 Tab by mouth daily.  30 Tab  11           ?  potassium chloride (KLOR-CON) 10 mEq tablet  Take 1 Tab by mouth two (2) times a day. (Patient taking differently: Take 10 mEq by mouth two (2) times daily as needed  (takes with lasix).)  60 Tab  6           ?  nitroglycerin (NITROSTAT) 0.4 mg SL tablet  1 Tab by SubLINGual route as needed for Chest Pain. Up to 3 doses.  1 Bottle  5          Allergies        Allergen  Reactions         ?  Aspirin  Hives     ?  Gluten  Other (comments)             Abdominal pain and hip pain         ?  Tylenol [Acetaminophen]  Hives          Patient Active Problem List          Diagnosis        ?  Anterior myocardial infarction [facility])     ?  Essential hypertension, benign     ?  Coronary atherosclerosis of native coronary artery     ?  Dyslipidemia     ?  Dilated cardiomyopathy ([place])     ?  Atrial fibrillation with RVR ([place])     ?  Thrombus of left atrial appendage     ?  Chest pain, unspecified     ?  Unstable angina ([facility])     ?  Hypertension        ?  CAD (coronary artery disease)             Past Medical History:        Diagnosis  Date         ?  Atrial fibrillation with RVR ([place])  11/05/2017     ?  CAD (coronary artery disease)       ?  CVA (cerebral vascular accident) ([place])  09/05/2017     ?  Dyslipidemia       ?  Hypertension           ?  Unstable angina ([place])  02/02/2010          Past Surgical History:         Procedure  Laterality  Date          ?  CARDIAC SURG PROCEDURE UNLIST    11/2009          stent x1 LAD           ?  PR LEFT HEART CATH,PERCUTANEOUS    02/02/2010          stent x1        No family history on file.     Social History          Tobacco Use         ?  Smoking status:  Never Smoker     ?  Smokeless tobacco:  Never Used       Substance Use Topics         ?  Alcohol use:  No           ROS:  A comprehensive review of systems was performed with the pertinent positives and negatives as noted in the HPI in addition to:      Review of Systems    Constitutional: Negative.     HENT: Negative.     Eyes: Negative.     Respiratory: Negative.     Cardiovascular: Negative.     Gastrointestinal: Negative.     Genitourinary: Negative.     Musculoskeletal: Negative.     Skin: Negative.      Neurological: Negative.     Endo/Heme/Allergies: Negative.     Psychiatric/Behavioral: Negative.              PHYSICAL EXAM:       Visit Vitals      BP  132/80     Pulse  (!) 120     Ht  _0  (1.93 m)     Wt  255 lb (115.7 kg)        BMI  31.04 kg/m??              Wt Readings from Last 3 Encounters:        06/12/18  255 lb (115.7 kg)     06/04/18  255 lb (115.7 kg)        03/20/18  248 lb (112.5 kg)          BP Readings from Last 3 Encounters:        06/12/18  132/80     06/04/18  132/77        01/18/18  144/76           Gen: Well appearing, well developed, no acute distress   Eyes: Pupils equal, round. Extraocular movements are intact   ENT: Oropharynx clear, no oral lesions, normal dentition   CV: S1S2, IRRR, tachy, no murmurs, rubs or gallops, normal JVD, no carotid bruits, normal distal pulses, no LEE   Pulm: Clear to auscultation bilaterally, no accessory muscle uses, no wheezes or rales   GI: Soft, NT, ND, +BS   Neuro: Alert and oriented, nonfocal   Psych: Appropriate affect   Skin: Normal color and skin turgor   MSK: Normal muscle bulk and tone      Medical problems and test results were reviewed with the patient today.       No results found for any visits on 06/12/18.        Lab Results         Component  Value  Date/Time            Potassium  4.2  06/04/2018 06:35 AM          Lab Results         Component  Value  Date/Time            Creatinine  1.11  06/04/2018 06:35 AM  Lab Results         Component  Value  Date/Time            HGB  15.9  06/04/2018 06:35 AM          Lab Results         Component  Value  Date/Time            INR  1.0  06/04/2018 06:35 AM       INR  1.1  12/18/2017 09:19 AM       INR  1.2  11/07/2017 04:14 AM       Prothrombin time  13.3  06/04/2018 06:35 AM       Prothrombin time  14.6 (H)  12/18/2017 09:19 AM            Prothrombin time  15.4 (H)  11/07/2017 04:14 AM

## 2018-06-12 NOTE — Progress Notes (Signed)
[facility]  [address], [address]  [place] [address]  PHONE: [phone]        06/12/18        NAME:  [name] [name]  DOB: 12/27/1955  MRN: [id number]     Follow Up     ASSESSMENT and PLAN:  Diagnoses and all orders for this visit:    1. CAD     2. Thrombus of left atrial appendage    3. Essential hypertension    4. Atherosclerosis of native coronary artery of native heart with stable angina pectoris ([place])    5. Atrial fibrillation with RVR ([place])    6. Anterior myocardial infarction ([place])    7. Dyslipidemia    8. Stroke 08/5438     63 year old male with persistent AF with finding of LAA thrombus and reduced EF. I suspect his EF is reduced, at least in part from his AF. Maintenance of rhythm is ideal but we cannot do this at this time given his presence of LAA thrombus. He underwent TEE/DCCV in 12/2017 and he's been feeling better. He is now back in AF but did feel better when he was in NSR. Now interested in the ablation procedure.     -Continue Eliquis.   -Back in AF, will plan for CRT-D/AVJ given LAA thrombus and severely dilated LA.   -Continue GDMT.     Procedure to be performed: BIV/AVJ, likely 3 lead device  Device/ablation Company: [employer]  Procedure Date: TBD  Medications to hold, days to hold ([place], AAD): Eliquis, 2 days.  Anesthesia: MAC       The patient has been referred to for an Implantable Cardioverter- Defibrillator (ICD).  The cardiologist has discussed and allowed the patient to ask questions regarding ICD. he was provided with a Shared Decision ICD decision making tool (booklet) today in clinic.     CRT-D   I discussed in detail the potential benefits of CRT-D therapy, including improved mortality, prevention of SCD,  decreased hospitalization, beneficial cardiac remodeling, and prevention of disease progression. Additionally, I discussed with the patient the potential risks of CRT-D implantation, including the risk of bleeding, infection, venous occlusion, DVT/PE, pneumothorax, cardiac tamponade, perforation, need for urgent open heart surgery, device/lead failure, lead dislodgement, inability to place an LV lead via the coronary sinus, inappropriate shock(s), heart attack, stroke, arrhythmia, radiation skin injury, kidney damage/failure, oversedation, respiratory arrest, and even death.  The patient understands these risks in the context of the potential benefits of the device implantation, and agrees to proceed.      PM/CRT & AVN Ablation  I discussed in detail the potential benefits of PM/CRT therapy with AV nodal ablation, including definitive rate control with possible improved mortality, prevention of SCD,  decreased hospitalization, beneficial cardiac remodeling, and prevention of disease progression. Additionally, I discussed with the patient the potential risks of PM/CRT implantation and subsequent AVN ablation, including the risk of bleeding, infection, venous occlusion, DVT/PE, pneumothorax, cardiac tamponade, perforation, need for urgent open heart surgery, device/lead failure or lead dislodgement with pacemaker dependence, inability to place an LV lead via the coronary sinus, inappropriate shock(s), heart attack, stroke, arrhythmia, radiation skin injury, kidney damage/failure, oversedation, respiratory arrest, and even death.  The patient understands these risks in the context of the potential benefits of the device implantation and subsequent ablation, and agrees to proceed.        DEVICE on [facility]   The patient has persistent AF and is on oral anticoagulation therapy ([place]),  and we reviewed the small increased risk of CVA with DFT testing, especially if [place] is discontinued. The patient is aware of the small increased  of significant hematoma and bleeding, but we reviewed the recent data suggesting that device implantation on [place] is safe and reasonable in these cases.      TOTAL TIME: 25 minutes, >50% during counseling and coordination of care    .Thank you for allowing me to participate in the electrophysiologic care of Mr. [name] [name]. Please contact me if any questions or concerns were to arise.    Elisabeth Pigeon. Vianca Bracher MD, MS  Clinical Cardiac Electrophysiology  [facility]  06/12/18  3:16 PM    ===================================================================  Chief Complant:    Chief Complaint   Patient presents with   ??? Irregular Heart Beat     hospital follow up        Consultation is requested by Corrie Dandy for evaluation of Irregular Heart Beat (hospital follow up)      History:  [name] [name] is a most pleasant 64 y.o. male with a past medical and cardiac history significant for anterior MI, CVA (May 2019, received TPA) , HTN, CAD, dyslipidemia, AFIB and HTN who underwent TEE on 11/05/17. He was found to have a reduced EF of 25% with anterior apical akinesis. He was found to have an LA thrombus that was confirmed with definity. Patient was admitted and started on IV heparin with bridge to warfarin.     He comes in for followup. He went for AF ablation and has LAA thrombus and markedly dilated LA. We cancelled ablation and discussed CRT-D. AF with RVR today. The patient otherwise denies chest pain, dyspnea, presyncope, syncope or lateralizing symptoms.    Cardiac PMH: (Old records have been reviewed and summarized below)    EKG:  (EKG has been independently visualized by me with interpretation below): Atrial fibrillation with mild tachycardia.     ECHO: 11/05/2017   - ??Left ventricle: The ventricle was dilated. Systolic function was markedly  reduced. Ejection fraction was estimated in the range of 15 % to 20 %.    - ??Right ventricle: Systolic function was reduced.    - ??Left atrium: The atrium was dilated.  - ??Left atrial appendage: There was continuous spontaneous echo contrast  ("smoke") in the appendage. There was thrombus noted in the LAA. Due to  thrombus cardioversion was not performed.    - ??Atrial septum: Appeared to be a PFO by color doppler.    - ??Right atrium: The atrium was dilated.    - ??Mitral valve: There was mild regurgitation.    - ??Tricuspid valve: There was mild regurgitation.    - ??Aorta, systemic arteries: There was mild atheroma.    Previous Heart Catheterization: 09/2010  CORONARY ARTERIOGRAMS  1. Both the right and left coronaries were large and patulous  proximally.  2. The left main coronary was quite large, was huge, and then tapered to  the LAD, circumflex systems.  3. The LAD coronary had evidence of proximal stenting which emanated from  the left main. The stented segment appeared widely patent with a good  inflow and outflow and no significant disruption. No luminal narrowing  and TIMI-3 flow in the distal LAD. There were no significant mid LAD  stenoses.  4. The circumflex coronary artery provided a ramus intermedius and a  moderate mid vessel marginal. The ramus intermedius was tortuous but  appeared free of significant disease. There was a high  diagonal as well  which was tortuous and paralleled the ramus. It was small in caliber and  extent, was diffusely irregular but no high-grade stenosis.  5. The circumflex coronary artery contained minor irregularity proximally  and then had a 40-50% stenosis. It was ectatic before it gave rise to the  mid vessel marginal. The mid vessel marginal had 50% stenosis in its  origin. The circumflex after the marginal origin contained an 80%   stenosis but was very limited in the distal circulation. On review of the  left system anatomy. There was no significant change from that previously  in October 2011.  6. The right coronary artery was also patulous and irregular, large  caliber trunk, tortuous with a mid vessel 40% stenosis, with a small  button of ulceration. Then was diffusely irregular throughout its distal  half. There was evidence of previous stenting from the distal right into  the PDA. The stented segment was widely patent. There was good luminal  appearance inflow and outflow to a large PDA. There was a subsegmental  PDA which was patent and had minor irregularity throughout. This was much  smaller in caliber than the primary PDA.  7. 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  ??  IMPRESSION  1. Stable pattern of diffuse coronary disease as described.  2. Mild reduction in left ventricular systolic function with an apical  wall motion abnormality.    Stress Test: n/a     Past Medical History, Past Surgical History, Family history, Social History, and Medications were all reviewed with the patient today and updated as necessary.     Current Outpatient Medications   Medication Sig Dispense Refill   ??? OTHER antronex daily as needed for allergies     ??? losartan (COZAAR) 25 mg tablet Take 1 Tab by mouth daily. 30 Tab 11   ??? metoprolol succinate (TOPROL-XL) 100 mg tablet Take 1 Tab by mouth two (2) times a day. 60 Tab 11   ??? apixaban (ELIQUIS) 5 mg tablet Take 1 Tab by mouth two (2) times a day. 60 Tab 11   ??? furosemide (LASIX) 40 mg tablet Take 1 Tab by mouth daily. (Patient taking differently: Take 40 mg by mouth daily as needed.) 30 Tab 11   ??? clopidogrel (PLAVIX) 75 mg tab Take 1 Tab by mouth daily. 30 Tab 11    ??? potassium chloride (KLOR-CON) 10 mEq tablet Take 1 Tab by mouth two (2) times a day. (Patient taking differently: Take 10 mEq by mouth two (2) times daily as needed (takes with lasix).) 60 Tab 6   ??? nitroglycerin (NITROSTAT) 0.4 mg SL tablet 1 Tab by SubLINGual route as needed for Chest Pain. Up to 3 doses. 1 Bottle 5     Allergies   Allergen Reactions   ??? Aspirin Hives   ??? Gluten Other (comments)     Abdominal pain and hip pain   ??? Tylenol [Acetaminophen] Hives     Patient Active Problem List    Diagnosis   ??? Anterior myocardial infarction [facility])   ??? Essential hypertension, benign   ??? Coronary atherosclerosis of native coronary artery   ??? Dyslipidemia   ??? Dilated cardiomyopathy ([place])   ??? Atrial fibrillation with RVR ([place])   ??? Thrombus of left atrial appendage   ??? Chest pain, unspecified   ??? Unstable angina (HCC)   ??? Hypertension   ???  CAD (coronary artery disease)       Past Medical History:   Diagnosis Date   ??? Atrial fibrillation with RVR ([place]) 11/05/2017   ??? CAD (coronary artery disease)    ??? CVA (cerebral vascular accident) ([place]) 09/05/2017   ??? Dyslipidemia    ??? Hypertension    ??? Unstable angina ([place]) 02/02/2010     Past Surgical History:   Procedure Laterality Date   ??? CARDIAC SURG PROCEDURE UNLIST  11/2009    stent x1 LAD    ??? PR LEFT HEART CATH,PERCUTANEOUS  02/02/2010    stent x1     No family history on file.  Social History     Tobacco Use   ??? Smoking status: Never Smoker   ??? Smokeless tobacco: Never Used   Substance Use Topics   ??? Alcohol use: No       ROS:  A comprehensive review of systems was performed with the pertinent positives and negatives as noted in the HPI in addition to:    Review of Systems   Constitutional: Negative.    HENT: Negative.    Eyes: Negative.    Respiratory: Negative.    Cardiovascular: Negative.    Gastrointestinal: Negative.    Genitourinary: Negative.    Musculoskeletal: Negative.    Skin: Negative.    Neurological: Negative.    Endo/Heme/Allergies: Negative.     Psychiatric/Behavioral: Negative.          PHYSICAL EXAM:     Visit Vitals  BP 132/80   Pulse (!) 120   Ht 6' 4"  (1.93 m)   Wt 255 lb (115.7 kg)   BMI 31.04 kg/m??        Wt Readings from Last 3 Encounters:   06/12/18 255 lb (115.7 kg)   06/04/18 255 lb (115.7 kg)   03/20/18 248 lb (112.5 kg)     BP Readings from Last 3 Encounters:   06/12/18 132/80   06/04/18 132/77   01/18/18 144/76       Gen: Well appearing, well developed, no acute distress  Eyes: Pupils equal, round. Extraocular movements are intact  ENT: Oropharynx clear, no oral lesions, normal dentition  CV: S1S2, IRRR, tachy, no murmurs, rubs or gallops, normal JVD, no carotid bruits, normal distal pulses, no LEE  Pulm: Clear to auscultation bilaterally, no accessory muscle uses, no wheezes or rales  GI: Soft, NT, ND, +BS  Neuro: Alert and oriented, nonfocal  Psych: Appropriate affect  Skin: Normal color and skin turgor  MSK: Normal muscle bulk and tone    Medical problems and test results were reviewed with the patient today.     No results found for any visits on 06/12/18.    Lab Results   Component Value Date/Time    Potassium 4.2 06/04/2018 06:35 AM     Lab Results   Component Value Date/Time    Creatinine 1.11 06/04/2018 06:35 AM     Lab Results   Component Value Date/Time    HGB 15.9 06/04/2018 06:35 AM     Lab Results   Component Value Date/Time    INR 1.0 06/04/2018 06:35 AM    INR 1.1 12/18/2017 09:19 AM    INR 1.2 11/07/2017 04:14 AM    Prothrombin time 13.3 06/04/2018 06:35 AM    Prothrombin time 14.6 (H) 12/18/2017 09:19 AM    Prothrombin time 15.4 (H) 11/07/2017 04:14 AM

## 2018-06-12 NOTE — Patient Instructions (Signed)
Atrial Fibrillation: Care Instructions  Your Care Instructions    Atrial fibrillation is an irregular and often fast heartbeat. Treating this condition is important for several reasons. It can cause blood clots, which can travel from your heart to your brain and cause a stroke. If you have a fast heartbeat, you may feel lightheaded, dizzy, and weak. An irregular heartbeat can also increase your risk for heart failure.  Atrial fibrillation is often the result of another heart condition, such as high blood pressure or coronary artery disease. Making changes to improve your heart condition will help you stay healthy and active.  Follow-up care is a key part of your treatment and safety. Be sure to make and go to all appointments, and call your doctor if you are having problems. It's also a good idea to know your test results and keep a list of the medicines you take.  How can you care for yourself at home?  Medicines  ?? ?? Take your medicines exactly as prescribed. Call your doctor if you think you are having a problem with your medicine. You will get more details on the specific medicines your doctor prescribes.   ?? ?? If your doctor has given you a blood thinner to prevent a stroke, be sure you get instructions about how to take your medicine safely. Blood thinners can cause serious bleeding problems.   ?? ?? Do not take any vitamins, over-the-counter drugs, or herbal products without talking to your doctor first.   ??Lifestyle changes  ?? ?? Do not smoke. Smoking can increase your chance of a stroke and heart attack. If you need help quitting, talk to your doctor about stop-smoking programs and medicines. These can increase your chances of quitting for good.   ?? ?? Eat a heart-healthy diet.   ?? ?? Stay at a healthy weight. Lose weight if you need to.   ?? ?? Limit alcohol to 2 drinks a day for men and 1 drink a day for women. Too much alcohol can cause health problems.    ?? ?? Avoid colds and flu. Get a pneumococcal vaccine shot. If you have had one before, ask your doctor whether you need another dose. Get a flu shot every year. If you must be around people with colds or flu, wash your hands often.   Activity  ?? ?? If your doctor recommends it, get more exercise. Walking is a good choice. Bit by bit, increase the amount you walk every day. Try for at least 30 minutes on most days of the week. You also may want to swim, bike, or do other activities. Your doctor may suggest that you join a cardiac rehabilitation program so that you can have help increasing your physical activity safely.   ?? ?? Start light exercise if your doctor says it is okay. Even a small amount will help you get stronger, have more energy, and manage stress. Walking is an easy way to get exercise. Start out by walking a little more than you did in the hospital. Gradually increase the amount you walk.   ?? ?? When you exercise, watch for signs that your heart is working too hard. You are pushing too hard if you cannot talk while you are exercising. If you become short of breath or dizzy or have chest pain, sit down and rest immediately.   ?? ?? Check your pulse regularly. Place two fingers on the artery at the palm side of your wrist, in line with your thumb.   If your heartbeat seems uneven or fast, talk to your doctor.   When should you call for help?  Call 911 anytime you think you may need emergency care. For example, call if:  ?? ?? You have symptoms of a heart attack. These may include:  ? Chest pain or pressure, or a strange feeling in the chest.  ? Sweating.  ? Shortness of breath.  ? Nausea or vomiting.  ? Pain, pressure, or a strange feeling in the back, neck, jaw, or upper belly or in one or both shoulders or arms.  ? Lightheadedness or sudden weakness.  ? A fast or irregular heartbeat.  After you call 911, the operator may tell you to chew 1 adult-strength or  2 to 4 low-dose aspirin. Wait for an ambulance. Do not try to drive yourself.   ?? ?? You have symptoms of a stroke. These may include:  ? Sudden numbness, tingling, weakness, or loss of movement in your face, arm, or leg, especially on only one side of your body.  ? Sudden vision changes.  ? Sudden trouble speaking.  ? Sudden confusion or trouble understanding simple statements.  ? Sudden problems with walking or balance.  ? A sudden, severe headache that is different from past headaches.   ?? ?? You passed out (lost consciousness).   ??Call your doctor now or seek immediate medical care if:  ?? ?? You have new or increased shortness of breath.   ?? ?? You feel dizzy or lightheaded, or you feel like you may faint.   ?? ?? Your heart rate becomes irregular.   ?? ?? You can feel your heart flutter in your chest or skip heartbeats. Tell your doctor if these symptoms are new or worse.   ??Watch closely for changes in your health, and be sure to contact your doctor if you have any problems.  Where can you learn more?  Go to http://www.healthwise.net/[employer].  Enter U020 in the search box to learn more about "Atrial Fibrillation: Care Instructions."  Current as of: July 24, 2017  Content Version: 12.2  ?? 2006-2019 [employer]. Care instructions adapted under license by [employer] (which disclaims liability or warranty for this information). If you have questions about a medical condition or this instruction, always ask your healthcare professional. [employer] disclaims any warranty or liability for your use of this information.

## 2018-06-26 ENCOUNTER — Encounter: Attending: Cardiovascular Disease | Primary: Family Medicine

## 2018-06-28 ENCOUNTER — Encounter

## 2018-06-28 ENCOUNTER — Inpatient Hospital Stay: Admit: 2018-06-28 | Payer: Self-pay | Primary: Family Medicine

## 2018-06-28 DIAGNOSIS — I4891 Unspecified atrial fibrillation: Secondary | ICD-10-CM

## 2018-06-28 LAB — CBC WITH AUTOMATED DIFF
ABS. BASOPHILS: 0.1 10*3/uL (ref 0.0–0.2)
ABS. EOSINOPHILS: 0.2 10*3/uL (ref 0.0–0.8)
ABS. IMM. GRANS.: 0 10*3/uL (ref 0.0–0.5)
ABS. LYMPHOCYTES: 1.3 10*3/uL (ref 0.5–4.6)
ABS. MONOCYTES: 0.7 10*3/uL (ref 0.1–1.3)
ABS. NEUTROPHILS: 4.9 10*3/uL (ref 1.7–8.2)
ABSOLUTE NRBC: 0 10*3/uL (ref 0.0–0.2)
BASOPHILS: 1 % (ref 0.0–2.0)
EOSINOPHILS: 3 % (ref 0.5–7.8)
HCT: 47.3 % (ref 41.1–50.3)
HGB: 15.6 g/dL (ref 13.6–17.2)
IMMATURE GRANULOCYTES: 0 % (ref 0.0–5.0)
LYMPHOCYTES: 18 % (ref 13–44)
MCH: 29.5 PG (ref 26.1–32.9)
MCHC: 33 g/dL (ref 31.4–35.0)
MCV: 89.4 FL (ref 79.6–97.8)
MONOCYTES: 10 % (ref 4.0–12.0)
MPV: 10.6 FL (ref 9.4–12.3)
NEUTROPHILS: 68 % (ref 43–78)
PLATELET: 232 10*3/uL (ref 150–450)
RBC: 5.29 M/uL (ref 4.23–5.6)
RDW: 14.6 % (ref 11.9–14.6)
WBC: 7.2 10*3/uL (ref 4.3–11.1)

## 2018-06-28 LAB — METABOLIC PANEL, BASIC
Anion gap: 7 mmol/L (ref 7–16)
BUN: 17 MG/DL (ref 8–23)
CO2: 27 mmol/L (ref 21–32)
Calcium: 9 MG/DL (ref 8.3–10.4)
Chloride: 104 mmol/L (ref 98–107)
Creatinine: 1 MG/DL (ref 0.8–1.5)
GFR est AA: >60 mL/min/{1.73_m2} (ref >60)
GFR est non-AA: >60 mL/min/{1.73_m2} (ref >60)
Glucose: 112 mg/dL — ABNORMAL HIGH (ref 65–100)
Potassium: 4.4 mmol/L (ref 3.5–5.1)
Sodium: 138 mmol/L (ref 136–145)

## 2018-06-28 LAB — PROTHROMBIN TIME + INR
INR: 1
Prothrombin time: 13.2 s (ref 12.0–14.7)

## 2018-06-28 LAB — MAGNESIUM
Magnesium: 2.5 mg/dL — ABNORMAL HIGH (ref 1.8–2.4)
Magnesium: 2.5 mg/dL — ABNORMAL HIGH (ref 1.8–2.4)

## 2018-06-28 LAB — BASIC METABOLIC PANEL
Anion Gap: 7 mmol/L (ref 7–16)
BUN: 17 MG/DL (ref 8–23)
CO2: 27 mmol/L (ref 21–32)
Calcium: 9 MG/DL (ref 8.3–10.4)
Chloride: 104 mmol/L (ref 98–107)
Creatinine: 1 MG/DL (ref 0.8–1.5)
EGFR IF NonAfrican American: >60 mL/min/{1.73_m2} (ref >60)
GFR African American: >60 mL/min/{1.73_m2} (ref >60)
Glucose: 112 mg/dL — ABNORMAL HIGH (ref 65–100)
Potassium: 4.4 mmol/L (ref 3.5–5.1)
Sodium: 138 mmol/L (ref 136–145)

## 2018-06-28 LAB — CBC WITH AUTO DIFFERENTIAL
Basophils %: 1 % (ref 0.0–2.0)
Basophils Absolute: 0.1 10*3/uL (ref 0.0–0.2)
Eosinophils %: 3 % (ref 0.5–7.8)
Eosinophils Absolute: 0.2 10*3/uL (ref 0.0–0.8)
Granulocyte Absolute Count: 0 10*3/uL (ref 0.0–0.5)
Hematocrit: 47.3 % (ref 41.1–50.3)
Hemoglobin: 15.6 g/dL (ref 13.6–17.2)
Immature Granulocytes: 0 % (ref 0.0–5.0)
Lymphocytes %: 18 % (ref 13–44)
Lymphocytes Absolute: 1.3 10*3/uL (ref 0.5–4.6)
MCH: 29.5 PG (ref 26.1–32.9)
MCHC: 33 g/dL (ref 31.4–35.0)
MCV: 89.4 FL (ref 79.6–97.8)
MPV: 10.6 FL (ref 9.4–12.3)
Monocytes %: 10 % (ref 4.0–12.0)
Monocytes Absolute: 0.7 10*3/uL (ref 0.1–1.3)
NRBC Absolute: 0 10*3/uL (ref 0.0–0.2)
Neutrophils %: 68 % (ref 43–78)
Neutrophils Absolute: 4.9 10*3/uL (ref 1.7–8.2)
Platelets: 232 10*3/uL (ref 150–450)
RBC: 5.29 M/uL (ref 4.23–5.6)
RDW: 14.6 % (ref 11.9–14.6)
WBC: 7.2 10*3/uL (ref 4.3–11.1)

## 2018-06-28 LAB — PROTIME-INR
INR: 1
Protime: 13.2 s (ref 12.0–14.7)

## 2018-06-28 NOTE — Progress Notes (Signed)
 Orders Placed This Encounter   . CBC WITH AUTOMATED DIFF     Standing Status:   Future     Standing Expiration Date:   07/28/2018   . METABOLIC PANEL, BASIC     Standing Status:   Future     Standing Expiration Date:   07/28/2018   . PROTHROMBIN TIME + INR     Standing Status:   Future     Standing Expiration Date:   07/28/2018   . MAGNESIUM     Standing Status:   Future     Standing Expiration Date:   12/27/2018

## 2018-06-28 NOTE — Progress Notes (Signed)
Orders Placed This Encounter   ??? CBC WITH AUTOMATED DIFF     Standing Status:   Future     Standing Expiration Date:   07/28/2018   ??? METABOLIC PANEL, BASIC     Standing Status:   Future     Standing Expiration Date:   07/28/2018   ??? PROTHROMBIN TIME + INR     Standing Status:   Future     Standing Expiration Date:   07/28/2018   ??? MAGNESIUM     Standing Status:   Future     Standing Expiration Date:   12/27/2018

## 2018-07-01 ENCOUNTER — Ambulatory Visit: Admit: 2018-07-01 | Payer: Self-pay | Primary: Family Medicine

## 2018-07-01 ENCOUNTER — Ambulatory Visit: Payer: Self-pay | Primary: Family Medicine

## 2018-07-01 ENCOUNTER — Inpatient Hospital Stay: Admit: 2018-07-01 | Payer: Self-pay | Attending: Cardiovascular Disease | Primary: Family Medicine

## 2018-07-01 ENCOUNTER — Inpatient Hospital Stay: Payer: Self-pay

## 2018-07-01 LAB — EKG, 12 LEAD, INITIAL
Atrial Rate: 93 {beats}/min
Calculated R Axis: -92 degrees
Calculated T Axis: 86 degrees
Q-T Interval: 454 ms
QRS Duration: 182 ms
QTC Calculation (Bezet): 555 ms
Ventricular Rate: 90 {beats}/min

## 2018-07-01 LAB — MAGNESIUM
Magnesium: 2.3 mg/dL (ref 1.8–2.4)
Magnesium: 2.3 mg/dL (ref 1.8–2.4)

## 2018-07-01 LAB — EKG 12-LEAD
Atrial Rate: 93 {beats}/min
Q-T Interval: 454 ms
QRS Duration: 182 ms
QTc Calculation (Bazett): 555 ms
R Axis: -92 degrees
T Axis: 86 degrees
Ventricular Rate: 90 {beats}/min

## 2018-07-01 MED ORDER — PROPOFOL 10 MG/ML IV EMUL
10 mg/mL | INTRAVENOUS | Status: DC | PRN
Start: 2018-07-01 — End: 2018-07-01
  Administered 2018-07-01: 17:00:00 via INTRAVENOUS

## 2018-07-01 MED ORDER — DOCUSATE SODIUM 100 MG CAP
100 mg | Freq: Two times a day (BID) | ORAL | Status: DC | PRN
Start: 2018-07-01 — End: 2018-07-05

## 2018-07-01 MED ORDER — LIDOCAINE (PF) 20 MG/ML (2 %) IJ SOLN
20 mg/mL (2 %) | INTRAMUSCULAR | Status: DC | PRN
Start: 2018-07-01 — End: 2018-07-01
  Administered 2018-07-01: 17:00:00 via INTRAVENOUS

## 2018-07-01 MED ORDER — WATER FOR IRRIGATION, STERILE SOLUTION
50000 unit | Freq: Once | Status: AC
Start: 2018-07-01 — End: 2018-07-01
  Administered 2018-07-01: 18:00:00

## 2018-07-01 MED ORDER — PROPOFOL 10 MG/ML IV EMUL
10 mg/mL | INTRAVENOUS | Status: AC
Start: 2018-07-01 — End: ?

## 2018-07-01 MED ORDER — LIDOCAINE HCL 1 % (10 MG/ML) IJ SOLN
10 mg/mL (1 %) | INTRAMUSCULAR | Status: DC | PRN
Start: 2018-07-01 — End: 2018-07-02

## 2018-07-01 MED ORDER — CEFAZOLIN 3 GRAM/30 ML IN STERILE WATER INTRAVENOUS SYRINGE
3 gram/0 mL | Freq: Once | INTRAVENOUS | Status: DC
Start: 2018-07-01 — End: 2018-07-01

## 2018-07-01 MED ORDER — LIDOCAINE-EPINEPHRINE 1 %-1:100,000 IJ SOLN
1 %-:00,000 | INTRAMUSCULAR | Status: DC | PRN
Start: 2018-07-01 — End: 2018-07-01
  Administered 2018-07-01: 18:00:00 via INTRADERMAL

## 2018-07-01 MED ORDER — PROPOFOL 10 MG/ML IV EMUL
10 mg/mL | INTRAVENOUS | Status: DC | PRN
Start: 2018-07-01 — End: 2018-07-01
  Administered 2018-07-01 (×2): via INTRAVENOUS

## 2018-07-01 MED ORDER — ONDANSETRON (PF) 4 MG/2 ML INJECTION
4 mg/2 mL | Freq: Once | INTRAMUSCULAR | Status: DC | PRN
Start: 2018-07-01 — End: 2018-07-02

## 2018-07-01 MED ORDER — ONDANSETRON (PF) 4 MG/2 ML INJECTION
4 mg/2 mL | INTRAMUSCULAR | Status: DC | PRN
Start: 2018-07-01 — End: 2018-07-05

## 2018-07-01 MED ORDER — LIDOCAINE (PF) 20 MG/ML (2 %) IJ SOLN
20 mg/mL (2 %) | INTRAMUSCULAR | Status: AC
Start: 2018-07-01 — End: ?

## 2018-07-01 MED ORDER — CEFAZOLIN 2 GRAM/20 ML IN STERILE WATER INTRAVENOUS SYRINGE
2 gram/0 mL | Freq: Once | INTRAVENOUS | Status: AC
Start: 2018-07-01 — End: 2018-07-01
  Administered 2018-07-01: 18:00:00 via INTRAVENOUS

## 2018-07-01 MED ORDER — SODIUM CHLORIDE 0.9 % IJ SYRG
Freq: Three times a day (TID) | INTRAMUSCULAR | Status: DC
Start: 2018-07-01 — End: 2018-07-05

## 2018-07-01 MED ORDER — HYDROMORPHONE (PF) 1 MG/ML IJ SOLN
1 mg/mL | INTRAMUSCULAR | Status: DC | PRN
Start: 2018-07-01 — End: 2018-07-02

## 2018-07-01 MED ORDER — MIDAZOLAM 1 MG/ML IJ SOLN
1 mg/mL | Freq: Once | INTRAMUSCULAR | Status: AC | PRN
Start: 2018-07-01 — End: 2018-07-02

## 2018-07-01 MED ORDER — CEFAZOLIN 2 GRAM/20 ML IN STERILE WATER INTRAVENOUS SYRINGE
2 gram/0 mL | Freq: Three times a day (TID) | INTRAVENOUS | Status: DC
Start: 2018-07-01 — End: 2018-07-02

## 2018-07-01 MED ORDER — SODIUM CHLORIDE 0.9 % IJ SYRG
INTRAMUSCULAR | Status: DC | PRN
Start: 2018-07-01 — End: 2018-07-05

## 2018-07-01 MED ORDER — IOPAMIDOL 76 % IV SOLN
370 mg iodine /mL (76 %) | Freq: Once | INTRAVENOUS | Status: AC
Start: 2018-07-01 — End: 2018-07-01
  Administered 2018-07-01: 18:00:00 via INTRAVENOUS

## 2018-07-01 MED ORDER — OXYCODONE 5 MG TAB
5 mg | Freq: Once | ORAL | Status: DC | PRN
Start: 2018-07-01 — End: 2018-07-02

## 2018-07-01 MED ORDER — LACTATED RINGERS IV
INTRAVENOUS | Status: AC
Start: 2018-07-01 — End: 2018-07-02
  Administered 2018-07-01: 17:00:00 via INTRAVENOUS

## 2018-07-01 MED ORDER — ALBUTEROL SULFATE 0.083 % (0.83 MG/ML) SOLN FOR INHALATION
2.5 mg /3 mL (0.083 %) | RESPIRATORY_TRACT | Status: DC | PRN
Start: 2018-07-01 — End: 2018-07-02

## 2018-07-01 MED FILL — HYDROMORPHONE (PF) 2 MG/ML IJ SOLN: 2 mg/mL | INTRAMUSCULAR | Qty: 1

## 2018-07-01 MED FILL — LIDOCAINE-EPINEPHRINE 1 %-1:100,000 IJ SOLN: 1 %-:00,000 | INTRAMUSCULAR | Qty: 40

## 2018-07-01 MED FILL — ISOVUE-370  76 % INTRAVENOUS SOLUTION: 370 mg iodine /mL (76 %) | INTRAVENOUS | Qty: 125

## 2018-07-01 MED FILL — XYLOCAINE-MPF 20 MG/ML (2 %) INJECTION SOLUTION: 20 mg/mL (2 %) | INTRAMUSCULAR | Qty: 5

## 2018-07-01 MED FILL — BACITRACIN 50,000 UNIT IM: 50000 unit | INTRAMUSCULAR | Qty: 50000

## 2018-07-01 MED FILL — LACTATED RINGERS IV: INTRAVENOUS | Qty: 1000

## 2018-07-01 MED FILL — PROPOFOL 10 MG/ML IV EMUL: 10 mg/mL | INTRAVENOUS | Qty: 40

## 2018-07-01 MED FILL — PROPOFOL 10 MG/ML IV EMUL: 10 mg/mL | INTRAVENOUS | Qty: 140

## 2018-07-01 MED FILL — XYLOCAINE 10 MG/ML (1 %) INJECTION SOLUTION: 10 mg/mL (1 %) | INTRAMUSCULAR | Qty: 20

## 2018-07-01 NOTE — Progress Notes (Signed)
Patient received to CPRU room # 9  Ambulatory from lobby. Patient scheduled for AVN Ablation and ICD today with Dr Devona Konig. Procedure reviewed & questions answered, voiced good understanding consent obtained & placed on chart. All medications and medical history reviewed. Will prep patient per orders. Patient & family updated on plan of care.      The patient has a fraility score of 3-MANAGING WELL, based on ability to perform ADLs independently.

## 2018-07-01 NOTE — Progress Notes (Signed)
Bedrest completed, assisted up to bathroom, voided, tolerated OOB well. Reinforced instructions, keep LUE in sling, no lifting or pulling. DO NOT RAISE left arm, voiced understanding.

## 2018-07-01 NOTE — Progress Notes (Signed)
Do you currently have any signs or symptoms of respiratory infection, such as fever, cough, shortness of breath, or sore throat? NO    In the last 14 days have you had contact with someone with confirmed or presumptive diagnosis of COVID-19 or someone under investigation of COVID-19? NO    In the last 14 days have you traveled or have someone in your home who has traveled to [place], [place], [place], [place], [place], [place], [place], or [place]? NO  I

## 2018-07-01 NOTE — Anesthesia Pre-Procedure Evaluation (Signed)
Relevant Problems   No relevant active problems       Anesthetic History   No history of anesthetic complications            Review of Systems / Medical History  Patient summary reviewed and pertinent labs reviewed    Pulmonary        Sleep apnea (improved with wt loss)           Neuro/Psych       CVA (5/19, related to A Fib): no residual symptoms       Cardiovascular    Hypertension      CHF (EF 30% ?, well compensated)  Dysrhythmias : atrial fibrillation  CAD and cardiac stents (2011)    Exercise tolerance: >4 METS  Comments: LAA thrombus previously in 2019 prevention ablation     GI/Hepatic/Renal  Within defined limits              Endo/Other        Obesity     Other Findings              Physical Exam    Airway  Mallampati: III  TM Distance: 4 - 6 cm  Neck ROM: normal range of motion   Mouth opening: Normal     Cardiovascular    Rhythm: irregular  Rate: normal      Pertinent negatives: No murmur and peripheral edema   Dental  No notable dental hx       Pulmonary  Breath sounds clear to auscultation               Abdominal         Other Findings            Anesthetic Plan    ASA: 3  Anesthesia type: total IV anesthesia          Induction: Intravenous  Anesthetic plan and risks discussed with: Patient

## 2018-07-01 NOTE — Anesthesia Post-Procedure Evaluation (Signed)
Procedure(s):  BIV ICD/ AV NODE ABLATION.    total IV anesthesia    Anesthesia Post Evaluation        Patient location during evaluation: PACU  Patient participation: complete - patient participated  Level of consciousness: awake and alert  Pain management: adequate  Airway patency: patent  Anesthetic complications: no  Cardiovascular status: acceptable  Respiratory status: acceptable  Hydration status: acceptable  Post anesthesia nausea and vomiting:  controlled      Visit Vitals  BP (!) 115/97   Pulse 91   Temp 36.6 ??C (97.9 ??F)   Resp 18   SpO2 99%

## 2018-07-01 NOTE — Progress Notes (Signed)
Patient transported to room 314. Right groin and left upper chest without bleeding or hematoma.

## 2018-07-01 NOTE — Progress Notes (Signed)
TRANSFER - IN REPORT:    Verbal report received from Debbie RN(name) on [facility] [place]  being received from EP lab(unit) for routine progression of care      Report consisted of patient's Situation, Background, Assessment and   Recommendations([facility]).     Information from the following report(s) Procedure Summary was reviewed with the receiving nurse.    Opportunity for questions and clarification was provided.      Assessment completed upon patient's arrival to unit and care assumed.

## 2018-07-01 NOTE — Procedures (Signed)
OPERATOR: Elisabeth Pigeon. Nanine Means, MD     REFERRING: Self     Pre-Procedure Diagnosis  1. Dilated cardiomyopathy  2. EF 15-20% on GDMT for > 90 days.   3. NYHA class III symptoms.   4. Primary prevention.  5. Persistent atrial fibrillation.  6. Complete heart block.      Procedure Performed  1. Biventricular ICD implant.  2. AV node ablation     Anesthesia: MAC      Estimated Blood Loss: Less than 10 mL          Specimens: * No specimens in log *      Complications: None    Contrast: 71 cc    Fluoroscopy Time:  21.1 min       The procedure, indications, risks, benefits, and alternatives were discussed with the patient and family members, who desired to proceed after questions were answered and informed consent was documented.      Procedure: After informed consent was obtained, the patient was brought to the Electrophysiology Laboratory in a fasting state and was prepped and draped in sterile fashion. Prophylactic antibiotic was administered 10 minutes prior to skin incision: refer to anesthesia procedural documentation for details. MAC was administered by the anesthesia team with continuous oxygen saturation measurement and blood pressure measurement. A venogram was performed of the LUE and demonstrated a patent axillary and subclavian vein system. Local anesthetic (sensorcaine) was delivered to the left pectoral region and an incision was made parallel to the deltopectoral groove and a pocket was fashioned by blunt dissection. Bovie electrocautery was used for hemostasis. Access x 3 (Micropuncture kit) was obtained under ultrasound guidance using a modified Seldinger technique with placement of two short wires and one long wire down into the RA and advanced below the diaphragm followed by placement of a 8 Fr peel-away sheath over the short guidewire. The right ventricular ICD lead was advanced into the right ventricle under fluoroscopy. The helix was advanced into the RV low septum. After confirmation of stable pacing  characteristics, the sheath was peeled and the lead was sutured to the base of the pocket using 0-Ethibond. Another short 6 Fr sheath was placed into the pocket over a short guidewire. A pace/sense lead was then advanced into the right atrium and placed in the right atrial appendage. Confirmation of stable pacing characteristics as observed and the 6 Fr sheath was peeled from the pocket. The lead was sutured to the pocket with 0-Ethibond around the lead collar. Next, a Worley sheath was advanced over the long J wire and dilator. The dilator was removed and the braided core was advanced into the RA and then RV. The CS was cannulated using the [place] system. An occlusive coronary sinus venogram was then performed demonstrating a posterolateral target. There was a prominent Valve of Visseuns which was challenging to pass through. The target branch was just proximal to the valve. The inner and renal sheaths were then advanced into the proximal CS. Using selective when necessary, the posterolateral target was accessed with the use of Straight Trak wire and the inner sheaths were carefully advanced into the target branch. The inner sheath was then removed. The left ventricular lead was then advanced with into the posterolateral branch over the Straight Trak wire. Adequate thresholds were obtained with an adequate margin between phrenic stim and loss of capture. The delivery sheaths were then slit or peeled with fluoroscopy demonstrating stable position. The lead was then sutured to the underlying pectoralis fascia using 0-Ethibond  sutures around the lead collar. The old device was disconnected and removed from the pocket and existing leads tested via the pacing system analyzer (PSA) demonstrating stable sensing, impedance and pacing thresholds. The lead pins were then cleaned with antibiotic soaked gauze, dried gently, and attached to a new biventricular pacemaker generator. Pins were directly observed to pass the tip  electrode, and the ring hex wrench screws were secured, and leads tug tested. The device and leads were gently positioned within the pocket after the pocket was irrigated copiously with a saline antimicrobial solution. A retention suture was placed. The wound was closed with multiple layers of 2-0 Quill suture followed by skin closure with 4-0 V-Loc and a retention suture was placed. Dermabond solution was placed over the wound, allowed to dry, and then a sterile dressing was placed over the wound.  Fluoroscopic images were interpreted by me, in multiple projections. Final device position was confirmed by stored fluoroscopic image from device pocket to lead tips in AP projection.      AV node ablation:  Procedure: After informed consent was obtained, the patient was brought to the Electrophysiology Laboratory in a fasting state and was prepped and draped in sterile fashion. The pacemaker was reprogrammed to VVI 40 bpm. Local anesthetic was delivered to the region over the right femoral vein.  Access x 1 was obtained under ultrasound guidance using a modified Seldinger technique with placement of a short 8 F sheath into the right femoral vein. A 3.5 mm irrigated non-navigation ablation catheter (Biosense-Webster) was then advanced in the right atrium and the Bundle of His was map was mapped. The catheter was then advanced to the region of the AV junction and with delivery of RF there the appearance of accelerated junctional beats followed by complete heart block. A 20 minute waiting period was then observed and there was no return of AV conduction. The patient tolerated the procedure well and there were no complications. She was allowed to awake from anesthesia and transferred to the recovery area in stable condition.     The device was reprogrammed to DDDR 90 bpm.    Device and Lead Information  Pulse Generator Model # Manufacturer Serial # Location Implant/Explant   [id number] [facility] [id number] Left Pectoral Implant 03.16.2020      Lead Model Number Manufacturer Serial Number Lead position Implant/Explant   RA [id number] [id number] [id number] RA Appendage Implant 03.16.2020   RV 0673 BSC [id number] RV Apex Implant 03.16.2020   LV 4674 BSC [id number] LV Mid posteroateral Implant 03.16.2020     Lead Sensitivity and Threshold  Lead R or P sensitivity (mv) Threshold (V) Threshold PW (msec) Impedance (ohms) Final output Voltage (V) Final PW (msec)   RA 1.8 (fib) Afib ??? 753 3.5 0.4   RV >25.0 0.5 0.4 521 5.0 0.4   LV 23.3 0.7 0.4 1067 3.5 0.4   LV configuration of LV Ring 4 -> RV    RVs -> LVs Delays  Cathode LV Tip1 LV Ring2 LV Ring3 LV Ring4   Delay (ms) 35 39 48 44     Bradycardia Settings  Brady Mode LRL URL Pace AVD (ms) Sense AVD (ms) Rate Response Mode Switching Mode SW Rate BiV Timing   DDDR 90 130 180 120 On On 150 0     Tachycardia Settings  Zone Type VT-1 VT VF   ON/OFF/  MONITOR MONITOR ON ON   Zone Rate 150 185 220   1st Therapy Type  ATP ATP   Energy (  J)  x4 x1   2nd Therapy Type  Shock Shock   Energy (J)  31J 41J   3rd Therapy Type  Shock Shock   Energy (J)  41J 41J   4th Therapy Type  Shock Shock   Energy (J)  41J 41J   5th Therapy Type  Shock Shock   Energy (J)  41J 41J   6th Therapy Type  Shock Shock   Energy (J)  41Jx2 41Jx4     Defibrillation Threshold Testing  DFT# How induced Successful test? Shock Imped (ohms) Energy (J) Charge time (sec) Rescue needed? Defib threshold (J)   1 Did not test  89           IMPRESSION: Successful implantation of a [employer]  implantable biventricular cardioverter defibrillator (MRI conditional. Successful AV node ablation with device reprogramming.     PLAN:  -Overnight observation.  -Routine CIED instructions.  -Device clinic follow up in 1-2 weeks.  -Abx at discharge.  -Routine cardiac care in 3-4 months or PRN.      Elisabeth Pigeon. Nanine Means, MD, [facility]  [facility]

## 2018-07-01 NOTE — Progress Notes (Signed)
 TRANSFER - OUT REPORT:    Verbal report given to Briana RN(name) on Lynwood JAYSON Novak  being transferred to tele 314(unit) for routine progression of care       Report consisted of patient's Situation, Background, Assessment and   Recommendations(SBAR).     Information from the following report(s) Procedure Summary was reviewed with the receiving nurse.    Lines:   Peripheral IV 07/01/18 Right Hand (Active)       Peripheral IV 07/01/18 Left Forearm (Active)        Opportunity for questions and clarification was provided.      Patient transported with:   [employer]

## 2018-07-01 NOTE — Progress Notes (Signed)
Shift change, with bedside reporting completed, verbal report received from Maurilio Lovely, RN.   Reinforced Safety precautions: Call for assistance prior to activity, and use of nonskid socks before getting out of bed (OOB). Verbalized understanding of safety instructions. Hand held call-light within reach.     LUE elevated in sling, reinforced instructions given earlier, regarding No lifting or raising lt. Arm; Keep lt arm relaxed in sling at all times, no lifting or pulling. DO NOT USE LEFT ARM, voiced understanding.

## 2018-07-01 NOTE — Anesthesia Pre-Procedure Evaluation (Signed)
Relevant Problems   No relevant active problems       Anesthetic History   No history of anesthetic complications            Review of Systems / Medical History  Patient summary reviewed and pertinent labs reviewed    Pulmonary        Sleep apnea (improved with wt loss)           Neuro/Psych       CVA (5/19, related to A Fib): no residual symptoms       Cardiovascular    Hypertension      CHF (EF 30% ?, well compensated)  Dysrhythmias : atrial fibrillation  CAD and cardiac stents (2011)    Exercise tolerance: >4 METS  Comments: LAA thrombus previously in 2019 prevention ablation     GI/Hepatic/Renal  Within defined limits              Endo/Other        Obesity     Other Findings              Physical Exam    Airway  Mallampati: III  TM Distance: 4 - 6 cm  Neck ROM: normal range of motion   Mouth opening: Normal     Cardiovascular    Rhythm: irregular  Rate: normal      Pertinent negatives: No murmur and peripheral edema   Dental  No notable dental hx       Pulmonary  Breath sounds clear to auscultation               Abdominal         Other Findings            Anesthetic Plan    ASA: 3  Anesthesia type: total IV anesthesia          Induction: Intravenous  Anesthetic plan and risks discussed with: Patient

## 2018-07-01 NOTE — Progress Notes (Signed)
TRANSFER - IN REPORT:    Verbal report received from Debbie RN(name) on [name] [name]  being received from EP lab(unit) for routine progression of care      Report consisted of patient???s Situation, Background, Assessment and   Recommendations(SBAR).     Information from the following report(s) Procedure Summary was reviewed with the receiving nurse.    Opportunity for questions and clarification was provided.      Assessment completed upon patient???s arrival to unit and care assumed.

## 2018-07-01 NOTE — Other (Cosign Needed)
TRANSFER - IN REPORT:    Verbal report received from [place], RN on [name] [name] being received from CCL (unit) for routine progression of care.     Report consisted of patient???s Situation, Background, Assessment and Recommendations(SBAR).     Information from the following report(s) SBAR, Kardex, Procedure Summary and MAR was reviewed. Opportunity for questions and clarification was provided.      Assessment completed upon patient???s arrival to unit and care assumed.     Patient received to room 314. Patient connected to monitor and assessment completed. Plan of care reviewed. Patient oriented to room and call light. Patient aware to use call light to communicate any chest pain or needs.     Admission skin assessment completed with second RN and reveals the following: Skin on heels is CDI, blanchable, and absent from any bogginess. Due to BR till 2230, I was unable to assess sacrum. Pt stated that there was no known irritation, pressure sore, etc on his bottom. Pt has a left subclavian site and right groin venous stick site from pacer placement. No other skin abnormalities was seen during my assessment.

## 2018-07-01 NOTE — Anesthesia Post-Procedure Evaluation (Signed)
Procedure(s):  BIV ICD/ AV NODE ABLATION.    total IV anesthesia    Anesthesia Post Evaluation        Patient location during evaluation: PACU  Patient participation: complete - patient participated  Level of consciousness: awake and alert  Pain management: adequate  Airway patency: patent  Anesthetic complications: no  Cardiovascular status: acceptable  Respiratory status: acceptable  Hydration status: acceptable  Post anesthesia nausea and vomiting:  controlled      Visit Vitals  BP (!) 115/97   Pulse 91   Temp 36.6 ??C (97.9 ??F)   Resp 18   SpO2 99%

## 2018-07-01 NOTE — Progress Notes (Signed)
Shift change, with bedside reporting completed, verbal report received from Bryanna Callaham, RN.   Reinforced Safety precautions: Call for assistance prior to activity, and use of nonskid socks before getting out of bed (OOB). Verbalized understanding of safety instructions. Hand held call-light within reach.     LUE elevated in sling, reinforced instructions given earlier, regarding No lifting or raising lt. Arm; Keep lt arm relaxed in sling at all times, no lifting or pulling. DO NOT USE LEFT ARM, voiced understanding.

## 2018-07-01 NOTE — Progress Notes (Signed)
Do you currently have any signs or symptoms of respiratory infection, such as fever, cough, shortness of breath, or sore throat? NO    In the last 14 days have you had contact with someone with confirmed or presumptive diagnosis of COVID-19 or someone under investigation of COVID-19? NO    In the last 14 days have you traveled or have someone in your home who has traveled to [place], [place], [place], [place], [place], [place], [place], or [place]? NO  I

## 2018-07-01 NOTE — Progress Notes (Signed)
TRANSFER - OUT REPORT:    Verbal report given to Briana RN(name) on [name] [name]  being transferred to tele 314(unit) for routine progression of care       Report consisted of patient???s Situation, Background, Assessment and   Recommendations(SBAR).     Information from the following report(s) Procedure Summary was reviewed with the receiving nurse.    Lines:   Peripheral IV 07/01/18 Right Hand (Active)       Peripheral IV 07/01/18 Left Forearm (Active)        Opportunity for questions and clarification was provided.      Patient transported with:   [employer]

## 2018-07-01 NOTE — Progress Notes (Signed)
Patient received to CPRU room # 9  Ambulatory from lobby. Patient scheduled for AVN Ablation and ICD today with Dr Senfield. Procedure reviewed & questions answered, voiced good understanding consent obtained & placed on chart. All medications and medical history reviewed. Will prep patient per orders. Patient & family updated on plan of care.      The patient has a fraility score of 3-MANAGING WELL, based on ability to perform ADLs independently.

## 2018-07-01 NOTE — Procedures (Signed)
OPERATOR: Elisabeth Pigeon. Nanine Means, MD     REFERRING: Self     Pre-Procedure Diagnosis  1. Dilated cardiomyopathy  2. EF 15-20% on GDMT for > 90 days.   3. NYHA class III symptoms.   4. Primary prevention.  5. Persistent atrial fibrillation.  6. Complete heart block.      Procedure Performed  1. Biventricular ICD implant.  2. AV node ablation     Anesthesia: MAC      Estimated Blood Loss: Less than 10 mL          Specimens: * No specimens in log *      Complications: None    Contrast: 71 cc    Fluoroscopy Time:  21.1 min       The procedure, indications, risks, benefits, and alternatives were discussed with the patient and family members, who desired to proceed after questions were answered and informed consent was documented.      Procedure: After informed consent was obtained, the patient was brought to the Electrophysiology Laboratory in a fasting state and was prepped and draped in sterile fashion. Prophylactic antibiotic was administered 10 minutes prior to skin incision: refer to anesthesia procedural documentation for details. MAC was administered by the anesthesia team with continuous oxygen saturation measurement and blood pressure measurement. A venogram was performed of the LUE and demonstrated a patent axillary and subclavian vein system. Local anesthetic (sensorcaine) was delivered to the left pectoral region and an incision was made parallel to the deltopectoral groove and a pocket was fashioned by blunt dissection. Bovie electrocautery was used for hemostasis. Access x 3 (Micropuncture kit) was obtained under ultrasound guidance using a modified Seldinger technique with placement of two short wires and one long wire down into the RA and advanced below the diaphragm followed by placement of a 8 Fr peel-away sheath over the short guidewire. The right ventricular ICD lead was advanced into the right ventricle under fluoroscopy. The helix was advanced into the RV low septum. After confirmation of stable pacing  characteristics, the sheath was peeled and the lead was sutured to the base of the pocket using 0-Ethibond. Another short 6 Fr sheath was placed into the pocket over a short guidewire. A pace/sense lead was then advanced into the right atrium and placed in the right atrial appendage. Confirmation of stable pacing characteristics as observed and the 6 Fr sheath was peeled from the pocket. The lead was sutured to the pocket with 0-Ethibond around the lead collar. Next, a Worley sheath was advanced over the long J wire and dilator. The dilator was removed and the braided core was advanced into the RA and then RV. The CS was cannulated using the [place] system. An occlusive coronary sinus venogram was then performed demonstrating a posterolateral target. There was a prominent Valve of Visseuns which was challenging to pass through. The target branch was just proximal to the valve. The inner and renal sheaths were then advanced into the proximal CS. Using selective when necessary, the posterolateral target was accessed with the use of Straight Trak wire and the inner sheaths were carefully advanced into the target branch. The inner sheath was then removed. The left ventricular lead was then advanced with into the posterolateral branch over the Straight Trak wire. Adequate thresholds were obtained with an adequate margin between phrenic stim and loss of capture. The delivery sheaths were then slit or peeled with fluoroscopy demonstrating stable position. The lead was then sutured to the underlying pectoralis fascia using 0-Ethibond  sutures around the lead collar. The old device was disconnected and removed from the pocket and existing leads tested via the pacing system analyzer (PSA) demonstrating stable sensing, impedance and pacing thresholds. The lead pins were then cleaned with antibiotic soaked gauze, dried gently, and attached to a new biventricular pacemaker generator. Pins were directly observed to pass the tip  electrode, and the ring hex wrench screws were secured, and leads tug tested. The device and leads were gently positioned within the pocket after the pocket was irrigated copiously with a saline antimicrobial solution. A retention suture was placed. The wound was closed with multiple layers of 2-0 Quill suture followed by skin closure with 4-0 V-Loc and a retention suture was placed. Dermabond solution was placed over the wound, allowed to dry, and then a sterile dressing was placed over the wound.  Fluoroscopic images were interpreted by me, in multiple projections. Final device position was confirmed by stored fluoroscopic image from device pocket to lead tips in AP projection.      AV node ablation:  Procedure: After informed consent was obtained, the patient was brought to the Electrophysiology Laboratory in a fasting state and was prepped and draped in sterile fashion. The pacemaker was reprogrammed to VVI 40 bpm. Local anesthetic was delivered to the region over the right femoral vein.  Access x 1 was obtained under ultrasound guidance using a modified Seldinger technique with placement of a short 8 F sheath into the right femoral vein. A 3.5 mm irrigated non-navigation ablation catheter (Biosense-Webster) was then advanced in the right atrium and the Bundle of His was map was mapped. The catheter was then advanced to the region of the AV junction and with delivery of RF there the appearance of accelerated junctional beats followed by complete heart block. A 20 minute waiting period was then observed and there was no return of AV conduction. The patient tolerated the procedure well and there were no complications. She was allowed to awake from anesthesia and transferred to the recovery area in stable condition.     The device was reprogrammed to DDDR 90 bpm.    Device and Lead Information  Pulse Generator Model # Manufacturer Serial # Location Implant/Explant    [id number] [facility] [id number] Left Pectoral Implant 03.16.2020     Lead Model Number Manufacturer Serial Number Lead position Implant/Explant   RA 7741 BSC [id number] RA Appendage Implant 03.16.2020   RV 0673 BSC [id number] RV Apex Implant 03.16.2020   LV 4674 BSC [id number] LV Mid posteroateral Implant 03.16.2020     Lead Sensitivity and Threshold  Lead R or P sensitivity (mv) Threshold (V) Threshold PW (msec) Impedance (ohms) Final output Voltage (V) Final PW (msec)   RA 1.8 (fib) Afib ??? 753 3.5 0.4   RV >25.0 0.5 0.4 521 5.0 0.4   LV 23.3 0.7 0.4 1067 3.5 0.4   LV configuration of LV Ring 4 -> RV    RVs -> LVs Delays  Cathode LV Tip1 LV Ring2 LV Ring3 LV Ring4   Delay (ms) 35 39 48 44     Bradycardia Settings  Brady Mode LRL URL Pace AVD (ms) Sense AVD (ms) Rate Response Mode Switching Mode SW Rate BiV Timing   DDDR 90 130 180 120 On On 150 0     Tachycardia Settings  Zone Type VT-1 VT VF   ON/OFF/  MONITOR MONITOR ON ON   Zone Rate 150 185 220   1st Therapy Type  ATP ATP   Energy (  J)  x4 x1   2nd Therapy Type  Shock Shock   Energy (J)  31J 41J   3rd Therapy Type  Shock Shock   Energy (J)  41J 41J   4th Therapy Type  Shock Shock   Energy (J)  41J 41J   5th Therapy Type  Shock Shock   Energy (J)  41J 41J   6th Therapy Type  Shock Shock   Energy (J)  41Jx2 41Jx4     Defibrillation Threshold Testing  DFT# How induced Successful test? Shock Imped (ohms) Energy (J) Charge time (sec) Rescue needed? Defib threshold (J)   1 Did not test  89           IMPRESSION: Successful implantation of a [employer]  implantable biventricular cardioverter defibrillator (MRI conditional. Successful AV node ablation with device reprogramming.     PLAN:  -Overnight observation.  -Routine CIED instructions.  -Device clinic follow up in 1-2 weeks.  -Abx at discharge.  -Routine cardiac care in 3-4 months or PRN.      Elisabeth Pigeon. Nanine Means, MD, [facility]  [facility]

## 2018-07-01 NOTE — Progress Notes (Signed)
Bedrest completed, assisted up to bathroom, voided, tolerated OOB well. Reinforced instructions, keep LUE in sling, no lifting or pulling. DO NOT RAISE left arm, voiced understanding.

## 2018-07-01 NOTE — Other (Signed)
Bedside and verbal shift report given to Bishop Lim[place], RN. L subclavian and R groin site assessed at bedside

## 2018-07-01 NOTE — Progress Notes (Signed)
Patient transported to room 314. Right groin and left upper chest without bleeding or hematoma.

## 2018-07-02 ENCOUNTER — Ambulatory Visit: Admit: 2018-07-02 | Payer: Self-pay | Primary: Family Medicine

## 2018-07-02 LAB — GLUCOSE, POC: Glucose (POC): 110 mg/dL — ABNORMAL HIGH (ref 65–100)

## 2018-07-02 LAB — POCT GLUCOSE: POC Glucose: 110 mg/dL — ABNORMAL HIGH (ref 65–100)

## 2018-07-02 MED ORDER — OXYCODONE 5 MG TAB
5 mg | ORAL_TABLET | Freq: Four times a day (QID) | ORAL | 0 refills | Status: AC | PRN
Start: 2018-07-02 — End: 2018-07-07

## 2018-07-02 MED ORDER — CEFAZOLIN 2 GRAM/20 ML IN STERILE WATER INTRAVENOUS SYRINGE
2 gram/0 mL | Freq: Once | INTRAVENOUS | Status: AC
Start: 2018-07-02 — End: 2018-07-02
  Administered 2018-07-02: 13:00:00 via INTRAVENOUS

## 2018-07-02 MED FILL — CEFAZOLIN 2 GRAM/20 ML IN STERILE WATER INTRAVENOUS SYRINGE: 2 gram/0 mL | INTRAVENOUS | Qty: 20

## 2018-07-02 MED FILL — DOCUSATE SODIUM 100 MG CAP: 100 mg | ORAL | Qty: 1

## 2018-07-02 NOTE — Progress Notes (Signed)
BEDSIDE, Shift change report given to oncoming Primary Nurse Maurilio Lovely, RN.

## 2018-07-02 NOTE — Discharge Summary (Signed)
Discharge Summary by Waynetta Pean, NP at 07/02/18 1740                Author: Waynetta Pean, NP  Service: Cardiology  Author Type: Nurse Practitioner       Filed: 07/02/18 0733  Date of Service: 07/02/18 0721  Status: Attested           Editor: Waynetta Pean, NP (Nurse Practitioner)  Cosigner: Rhett Bannister, MD at 07/02/18 1006          Attestation signed by Rhett Bannister, MD at 07/02/18 1006          I have personally seen and examined the patient with the physician extender and agree with above assessment/plan.The patient is stable for discharge.  Medications  and follow up plans were reviewed with the patient.  All questions were answered with the patient voicing full understanding.       63 year old male s/p successful CRT/AV node ablation. Stable overnight. Will be discharged today in stable condition with planned device clinic follow up. Will plan to restart OAC as outpt. Pocket stable today with stable device interrogation and CXR.       [name]. Devona Konig, MD, MS   Clinical Cardiac Electrophysiology   [facility]                                                         [facility] Discharge Summary        Patient ID:   [name] [name]   [id number]   62 y.o.   Nov 17, 1955      Admit date: 07/01/2018      Discharge date:  07/02/2018      Admitting Physician: Rhett Bannister, MD        Discharge Physician: Waynetta Pean, NP /Dr. Devona Konig      Admission Diagnoses: chronic systolic HF   Status post biventricular pacemaker [Z95.0]      Discharge Diagnoses:         Diagnosis        ?  Status post biventricular pacemaker     ?  Essential hypertension, benign     ?  Coronary atherosclerosis of native coronary artery     ?  Dyslipidemia     ?  Atrial fibrillation (HCC)     ?  Dilated cardiomyopathy [facility])           Cardiology Procedures this admission:  biventricular ICD, AV node ablation, CXR  and device interrogation       Consults: None      Hospital Course: Patient was seen at  the office of [facility]iology by Dr. Devona Konig for management of chronic systolic heart failure with AFIB and was subsequently scheduled for an AM Admission ICD  implantation and AV node ablation at [facility] on 07/01/18. Patient was taken to the EP lab and underwent successful implantation of [facility] [employer] biventricular ICD followed by AV node ablation by Dr. Devona Konig. Patient tolerated the procedure well and was  taken to the telemetry floor for recovery. Follow up chest xray showed no pneumothorax. The following morning patient was up feeling well without any complaints of chest pain, shortness of breath, or palpitations. Patient's left subclavian cath site was  clean, dry and intact without hematoma. Patient's  labs were WNL. Patient was seen and examined by Dr. Devona Konig and determined stable and ready for discharge. The patient's device was interrogated prior to d/c showing normal function. Patient was instructed  on the importance of medication compliance and outpatient follow up. The patient will continue Toprol and ARB for his HFrEF. Patient has been scheduled for follow-up with [facility] Cardiology -- device clinic on 07/11/18 at 1:00pm in the [place] office.       DISPOSITION: Patient has been instructed to keep affected arm below shoulder level for the next 4 weeks or until cleared by doctor.  The arm sling should be worn while sleeping.  The dressing will  be removed at follow-up.  The incision site must be kept clean and dry.  The patient may shower in a few days.  Lotions, powders, or creams should be avoided as these can increase the risk of infection.  The affected arm should not be used for any pushing,  pulling, or lifting until cleared by doctor. Driving is prohibited until cleared by doctor in a follow up appointment.  Any signs of infection including increased redness, suspicious drainage, or unexplained fever should be reported to the doctor immediately  by calling [phone].             Discharge  Exam:  Patient has been seen by Dr. Devona Konig: see his progress note for exam details.   Visit Vitals      BP  (!) 140/91     Pulse  90     Temp  97.3 ??F (36.3 ??C)     Resp  18     Ht  6\' 4"  (1.93 m)     Wt  118.2 kg (260 lb 8 oz)     SpO2  96%        BMI  31.71 kg/m??               Recent Results (from the past 24 hour(s))     MAGNESIUM          Collection Time: 07/01/18  9:27 AM         Result  Value  Ref Range            Magnesium  2.3  1.8 - 2.4 mg/dL       EKG, 12 LEAD, INITIAL          Collection Time: 07/01/18  4:30 PM         Result  Value  Ref Range            Ventricular Rate  90  BPM       Atrial Rate  93  BPM       QRS Duration  182  ms       Q-T Interval  454  ms       QTC Calculation (Bezet)  555  ms       Calculated R Axis  -92  degrees       Calculated T Axis  86  degrees       Diagnosis                 Ventricular-paced rhythm   Biventricular pacemaker detected   Abnormal ECG   When compared with ECG of 04-Jun-2018 06:52,   Electronic ventricular pacemaker has replaced Atrial fibrillation   Confirmed by CEBE  MD (UC), Balinda Quails ([id number]) on 07/01/2018 5:26:55 PM          GLUCOSE, POC  Collection Time: 07/01/18 10:07 PM         Result  Value  Ref Range            Glucose (POC)  110 (H)  65 - 100 mg/dL              Patient Instructions:         Current Discharge Medication List              START taking these medications          Details        oxyCODONE IR (ROXICODONE) 5 mg immediate release tablet  Take 1 Tab by mouth every six (6) hours as needed for Pain for up to 5 days. Max Daily Amount: 20 mg.   Qty: 10 Tab, Refills:  0          Associated Diagnoses: S/P ICD (internal cardiac defibrillator) procedure                     CONTINUE these medications which have NOT CHANGED          Details        losartan (COZAAR) 25 mg tablet  Take 1 Tab by mouth daily.   Qty: 30 Tab, Refills:  11               metoprolol succinate (TOPROL-XL) 100 mg tablet  Take 1 Tab by mouth two (2) times a day.   Qty: 60 Tab,  Refills:  11               furosemide (LASIX) 40 mg tablet  Take 1 Tab by mouth daily.   Qty: 30 Tab, Refills:  11          Associated Diagnoses: Localized edema               potassium chloride (KLOR-CON) 10 mEq tablet  Take 1 Tab by mouth two (2) times a day.   Qty: 60 Tab, Refills:  6               OTHER  antronex daily as needed for allergies               apixaban (ELIQUIS) 5 mg tablet  Take 1 Tab by mouth two (2) times a day.   Qty: 60 Tab, Refills:  11               clopidogrel (PLAVIX) 75 mg tab  Take 1 Tab by mouth daily.   Qty: 30 Tab, Refills:  11               nitroglycerin (NITROSTAT) 0.4 mg SL tablet  1 Tab by SubLINGual route as needed for Chest Pain. Up to 3 doses.   Qty: 1 Bottle, Refills:  5

## 2018-07-02 NOTE — Progress Notes (Signed)
Chart screened by case manager for discharge planning.  No needs identified at this time.  Please consult case manager if any new issues arise.

## 2018-07-02 NOTE — Progress Notes (Signed)
Patient resting quietly in bed. Slept at long intervals throughout the shift, no distress noted during hourly bedside checks. No request or needs at present.

## 2018-07-02 NOTE — Progress Notes (Signed)
BEDSIDE, Shift change report given to oncoming Primary Nurse Bryanna  Callaham, RN.

## 2018-07-02 NOTE — Other (Signed)
Discharge instructions reviewed with pt. Prescriptions given for roxicodone and med info sheets provided for all new medications. Opportunity for questions provided. Pt voiced understanding of all discharge instructions.

## 2018-07-02 NOTE — Progress Notes (Signed)
Chart screened by case manager for discharge planning.  No needs identified at this time.  Please consult case manager if any new issues arise.

## 2018-07-02 NOTE — Other (Signed)
Bedside and verbal shift report received from Kelly, RN

## 2018-07-02 NOTE — Discharge Summary (Signed)
[facility] Discharge Summary     Patient ID:  [name] [name]  [id number]  62 y.o.  03-Dec-1955    Admit date: 07/01/2018    Discharge date:  07/02/2018    Admitting Physician: Rhett Bannister, MD     Discharge Physician: Waynetta Pean, NP/Dr. Devona Konig    Admission Diagnoses: chronic systolic HF  Status post biventricular pacemaker [Z95.0]    Discharge Diagnoses:    Diagnosis   ??? Status post biventricular pacemaker   ??? Essential hypertension, benign   ??? Coronary atherosclerosis of native coronary artery   ??? Dyslipidemia   ??? Atrial fibrillation (HCC)   ??? Dilated cardiomyopathy [facility])       Cardiology Procedures this admission:  biventricular ICD, AV node ablation, CXR and device interrogation     Consults: None    Hospital Course: Patient was seen at the office of [facility] by Dr. Devona Konig for management of chronic systolic heart failure with AFIB and was subsequently scheduled for an AM Admission ICD implantation and AV node ablation at [facility] on 07/01/18. Patient was taken to the EP lab and underwent successful implantation of [facility] Scientific biventricular ICD followed by AV node ablation by Dr. Devona Konig. Patient tolerated the procedure well and was taken to the telemetry floor for recovery. Follow up chest xray showed no pneumothorax. The following morning patient was up feeling well without any complaints of chest pain, shortness of breath, or palpitations. Patient's left subclavian cath site was clean, dry and intact without hematoma. Patient's labs were WNL. Patient was seen and examined by Dr. Devona Konig and determined stable and ready for discharge. The patient's device was interrogated prior to d/c showing normal function. Patient was instructed on the importance of medication compliance and outpatient follow up. The patient will continue Toprol and ARB for his HFrEF. Patient has been scheduled for follow-up with [facility]  Cardiology -- device clinic on 07/11/18 at 1:00pm in the [place] office.     DISPOSITION: Patient has been instructed to keep affected arm below shoulder level for the next 4 weeks or until cleared by doctor.  The arm sling should be worn while sleeping.  The dressing will be removed at follow-up.  The incision site must be kept clean and dry.  The patient may shower in a few days.  Lotions, powders, or creams should be avoided as these can increase the risk of infection.  The affected arm should not be used for any pushing, pulling, or lifting until cleared by doctor. Driving is prohibited until cleared by doctor in a follow up appointment.  Any signs of infection including increased redness, suspicious drainage, or unexplained fever should be reported to the doctor immediately by calling [phone].          Discharge Exam:  Patient has been seen by Dr. Devona Konig: see his progress note for exam details.  Visit Vitals  BP (!) 140/91   Pulse 90   Temp 97.3 ??F (36.3 ??C)   Resp 18   Ht 6\' 4"  (1.93 m)   Wt 118.2 kg (260 lb 8 oz)   SpO2 96%   BMI 31.71 kg/m??         Recent Results (from the past 24 hour(s))   MAGNESIUM    Collection Time: 07/01/18  9:27 AM   Result Value Ref Range    Magnesium 2.3 1.8 - 2.4 mg/dL   EKG, 12 LEAD, INITIAL    Collection Time: 07/01/18  4:30 PM   Result Value  Ref Range    Ventricular Rate 90 BPM    Atrial Rate 93 BPM    QRS Duration 182 ms    Q-T Interval 454 ms    QTC Calculation (Bezet) 555 ms    Calculated R Axis -92 degrees    Calculated T Axis 86 degrees    Diagnosis       Ventricular-paced rhythm  Biventricular pacemaker detected  Abnormal ECG  When compared with ECG of 04-Jun-2018 06:52,  Electronic ventricular pacemaker has replaced Atrial fibrillation  Confirmed by CEBE  MD (UC), Balinda Quails [phone]) on 07/01/2018 5:26:55 PM     GLUCOSE, POC    Collection Time: 07/01/18 10:07 PM   Result Value Ref Range    Glucose (POC) 110 (H) 65 - 100 mg/dL         Patient Instructions:      Current Discharge Medication List      START taking these medications    Details   oxyCODONE IR (ROXICODONE) 5 mg immediate release tablet Take 1 Tab by mouth every six (6) hours as needed for Pain for up to 5 days. Max Daily Amount: 20 mg.  Qty: 10 Tab, Refills: 0    Associated Diagnoses: S/P ICD (internal cardiac defibrillator) procedure         CONTINUE these medications which have NOT CHANGED    Details   losartan (COZAAR) 25 mg tablet Take 1 Tab by mouth daily.  Qty: 30 Tab, Refills: 11      metoprolol succinate (TOPROL-XL) 100 mg tablet Take 1 Tab by mouth two (2) times a day.  Qty: 60 Tab, Refills: 11      furosemide (LASIX) 40 mg tablet Take 1 Tab by mouth daily.  Qty: 30 Tab, Refills: 11    Associated Diagnoses: Localized edema      potassium chloride (KLOR-CON) 10 mEq tablet Take 1 Tab by mouth two (2) times a day.  Qty: 60 Tab, Refills: 6      OTHER antronex daily as needed for allergies      apixaban (ELIQUIS) 5 mg tablet Take 1 Tab by mouth two (2) times a day.  Qty: 60 Tab, Refills: 11      clopidogrel (PLAVIX) 75 mg tab Take 1 Tab by mouth daily.  Qty: 30 Tab, Refills: 11      nitroglycerin (NITROSTAT) 0.4 mg SL tablet 1 Tab by SubLINGual route as needed for Chest Pain. Up to 3 doses.  Qty: 1 Bottle, Refills: 5

## 2018-07-09 ENCOUNTER — Encounter

## 2018-07-09 NOTE — Progress Notes (Signed)
This encounter is solely intended for the implant record.    [name] [name]  07/09/18  9:52 AM

## 2018-07-09 NOTE — Progress Notes (Signed)
This encounter is solely intended for the implant record.    [name] [name]  07/09/18  9:52 AM

## 2018-07-11 ENCOUNTER — Encounter

## 2018-07-11 ENCOUNTER — Institutional Professional Consult (permissible substitution): Admit: 2018-07-11 | Primary: Family Medicine

## 2018-07-11 DIAGNOSIS — I5032 Chronic diastolic (congestive) heart failure: Secondary | ICD-10-CM

## 2018-07-11 NOTE — Progress Notes (Signed)
This note will not be viewable in MyChart.    IN OFFICE CHECK    Preliminary Impression: All normal function with minor programming changes made today to improve the device's function.    Following/Billing MD: [name]. Devona Konig, M.D.    Implanting/Servicing MD: Malka So, M.D.    [name] [name] presented to the Device Clinic today for a routine follow up s/p BIV ICD and AVN. Left subclavian postop dressing was removed. Site approximated. No swelling or s/s of infection. Underlying paced. Leads stable. Lower rate decreased to 70bpm.  No high rates. All instructions were reviewed. The device was interrogated, and the results were forwarded to the ordering provider for review. Please see Interrogation report for the final results.     [name], RN  07/11/2018  1:40 PM

## 2018-07-11 NOTE — Progress Notes (Signed)
This note will not be viewable in MyChart.    IN OFFICE CHECK    Preliminary Impression: All normal function with minor programming changes made today to improve the device's function.    Following/Billing MD: Jeffrey J. Senfield, M.D.    Implanting/Servicing MD: Jeff Senfield, M.D.    [name] [name] presented to the Device Clinic today for a routine follow up s/p BIV ICD and AVN. Left subclavian postop dressing was removed. Site approximated. No swelling or s/s of infection. Underlying paced. Leads stable. Lower rate decreased to 70bpm.  No high rates. All instructions were reviewed. The device was interrogated, and the results were forwarded to the ordering provider for review. Please see Interrogation report for the final results.     [name], RN  07/11/2018  1:40 PM

## 2018-09-25 ENCOUNTER — Other Ambulatory Visit: Payer: Self-pay | Admitting: Family Medicine

## 2018-09-25 DIAGNOSIS — M1990 Unspecified osteoarthritis, unspecified site: Secondary | ICD-10-CM

## 2018-10-15 ENCOUNTER — Institutional Professional Consult (permissible substitution): Primary: Adult Health

## 2018-10-15 ENCOUNTER — Institutional Professional Consult (permissible substitution): Admit: 2018-10-15 | Primary: Family Medicine

## 2018-10-15 DIAGNOSIS — I5032 Chronic diastolic (congestive) heart failure: Secondary | ICD-10-CM

## 2018-10-15 NOTE — Progress Notes (Signed)
This note will not be viewable in MyChart.    IN OFFICE CHECK    Preliminary Impression: All normal function with minor programming changes made today to improve the device's function.    Following/Billing MD: [name]. Devona Konig, M.D.    Implanting/Servicing MD: Malka So, M.D.    Buckner Malta presented to the Device Clinic today for a routine follow up. The device was interrogated, and the results were forwarded to the ordering provider for review. Please see Interrogation report for the final results.     Valinda Hoar  10/15/2018  9:41 AM

## 2018-10-15 NOTE — Progress Notes (Signed)
This note will not be viewable in MyChart.    IN OFFICE CHECK    Preliminary Impression: All normal function with minor programming changes made today to improve the device's function.    Following/Billing MD: Jeffrey J. Senfield, M.D.    Implanting/Servicing MD: Jeff Senfield, M.D.    Alamin C Stanforth presented to the Device Clinic today for a routine follow up. The device was interrogated, and the results were forwarded to the ordering provider for review. Please see Interrogation report for the final results.     Maudean Hoffmann A Blaike Newburn  10/15/2018  9:41 AM

## 2018-10-16 ENCOUNTER — Encounter: Primary: Family Medicine

## 2018-10-17 NOTE — Telephone Encounter (Signed)
Pt states he was just here for device check and they turn his beats down. He states he has been feeling light headed and strange.

## 2018-10-17 NOTE — Telephone Encounter (Signed)
Lower rate decreased to 70bpm post AVN. Latitude requested now. Presenting rhythm afib/ pacing at 70. BIV pacing at 100%. The device noted 1 NSTVT episode this am. Patient was not able to correlate the episode w/ complaint. " I really had had it over a few days, but its better today."       States overall just notices a difference. Patient notified of normal findings. Patient will continue to monitor symptoms and will call/transmit for continued complaints.

## 2018-10-25 MED ORDER — LOSARTAN 25 MG TAB
25 mg | ORAL_TABLET | Freq: Every day | ORAL | 3 refills | Status: DC
Start: 2018-10-25 — End: 2019-05-27

## 2018-10-25 MED ORDER — CLOPIDOGREL 75 MG TAB
75 mg | ORAL_TABLET | Freq: Every day | ORAL | 3 refills | Status: DC
Start: 2018-10-25 — End: 2019-05-27

## 2018-10-25 NOTE — Telephone Encounter (Signed)
90 day supply requested

## 2018-11-18 NOTE — Telephone Encounter (Signed)
90 day supply needed

## 2018-11-19 MED ORDER — METOPROLOL SUCCINATE SR 100 MG 24 HR TAB
100 mg | ORAL_TABLET | Freq: Two times a day (BID) | ORAL | 3 refills | Status: DC
Start: 2018-11-19 — End: 2019-05-27

## 2018-12-30 ENCOUNTER — Ambulatory Visit
Admission: RE | Admit: 2018-12-30 | Discharge: 2018-12-30 | Disposition: A | Discharge status: Home / Self Care | Payer: Managed Care, Other (non HMO) | Source: Ambulatory Visit | Attending: Otolaryngology | Admitting: Otolaryngology

## 2018-12-30 ENCOUNTER — Other Ambulatory Visit: Payer: Self-pay

## 2018-12-30 ENCOUNTER — Ambulatory Visit
Admission: RE | Admit: 2018-12-30 | Discharge: 2018-12-30 | Disposition: A | Discharge status: Home / Self Care | Payer: Managed Care, Other (non HMO) | Attending: Otolaryngology | Admitting: Otolaryngology

## 2018-12-30 ENCOUNTER — Other Ambulatory Visit: Payer: Self-pay | Admitting: Otolaryngology

## 2018-12-30 DIAGNOSIS — J189 Pneumonia, unspecified organism: Secondary | ICD-10-CM

## 2019-01-01 ENCOUNTER — Other Ambulatory Visit: Payer: Self-pay | Admitting: Family Medicine

## 2019-01-01 DIAGNOSIS — M1990 Unspecified osteoarthritis, unspecified site: Secondary | ICD-10-CM

## 2019-01-06 ENCOUNTER — Telehealth: Payer: Self-pay | Admitting: Family Medicine

## 2019-01-06 NOTE — Telephone Encounter (Signed)
Dr. Kathyrn Sheriff - [facility] did xray on pt to find out his symptoms was not pneumonia - bronchitis - coughing.  Cannot sleep well.  Dr. Kathyrn Sheriff states pt needs to see his PCP since treatment is not helping pt.  May need to be referred to a lung specialist.  Please contact wife to let her know if this can be done.  Thanks, [employer]

## 2019-01-06 NOTE — Telephone Encounter (Signed)
I have not seen him in 2 years. I would covid test him then he needs an appointment with someone here.

## 2019-01-06 NOTE — Telephone Encounter (Signed)
Please advise 

## 2019-01-07 NOTE — Telephone Encounter (Signed)
Dr Orson Slick office is referring him to Pulmonology.  Appointment was made for him to see you next month (hopefully after he is well) to get reconnected and discuss use of Meloxicam

## 2019-01-10 ENCOUNTER — Ambulatory Visit (INDEPENDENT_AMBULATORY_CARE_PROVIDER_SITE_OTHER): Payer: Managed Care, Other (non HMO) | Admitting: Internal Medicine

## 2019-01-10 ENCOUNTER — Other Ambulatory Visit: Payer: Self-pay

## 2019-01-10 ENCOUNTER — Encounter: Payer: Self-pay | Admitting: Internal Medicine

## 2019-01-10 VITALS — BP 140/88 | HR 83 | Temp 97.9°F | Ht 69.0 in | Wt 195.2 lb

## 2019-01-10 DIAGNOSIS — R05 Cough: Secondary | ICD-10-CM

## 2019-01-10 DIAGNOSIS — R059 Cough, unspecified: Secondary | ICD-10-CM

## 2019-01-10 MED ORDER — FLUTICASONE PROPIONATE HFA 110 MCG/ACT IN AERO: 2.0000 | INHALATION_SPRAY | Freq: Two times a day (BID) | RESPIRATORY_TRACT | 6 refills | Status: DC

## 2019-01-10 MED ORDER — PANTOPRAZOLE SODIUM 40 MG PO TBEC: 40.0000 mg | DELAYED_RELEASE_TABLET | Freq: Every day | ORAL | 1 refills | Status: DC

## 2019-01-10 NOTE — Progress Notes (Signed)
Name: Stephen Sosa. MRN: [id number] DOB: 31-May-1955     CONSULTATION DATE: 01/10/2019  REFERRING MD : Charolett Bumpers  CHIEF COMPLAINT: COUGH   HISTORY OF PRESENT ILLNESS: 63 year old pleasant white male seen today for chronic cough for 6 weeks Started off with sore throat and some sinus congestion Described as a productive cough mostly in the morning Patient does have a history of reflux but he does not take any medicines for this  Patient is a non-smoker In the past several weeks patient did notice a rash based on the fact that his wife change the detergent He also wears a mask for work which was also washed in the detergent  He is a Charity fundraiser works in a plant in [place] he says that there is some dust exposure however he does wear a mask and is in a ventilated area  He is not exposed to secondhand smoke exposure He does not have a shortness of breath but does have some mild dyspnea on exertion He does notice some intermittent wheezing as well   He has never been on inhalers He has not been given antibiotics or prednisone at this time  I have explained to him that we may try several medicines and will need further testing  On physical exam there is some right lung Rales and rhonchi  PAST MEDICAL HISTORY :   has a past medical history of Arthritis, Headache, Joint pain, Joint swelling, Macular degeneration, and Phlebitis.  has a past surgical history that includes Varicose vein surgery (Bilateral, 2003); Knee arthroscopy (Left); Joint replacement (Right, 2005); Hernia repair (2000); and Total knee arthroplasty (Left, 01/25/2015). Prior to Admission medications   Medication Sig Start Date End Date Taking? Authorizing Provider  augmented betamethasone dipropionate (DIPROLENE-AF) 0.05 % ointment APPLY TO AFFECTED AREAS ON HANDS AND FEET TWICE DAILY UNTIL IMPROVED THEN AS NEEDED 01/23/17   [provider]  cefdinir (OMNICEF) 300 MG capsule Take 1 capsule  (300 mg total) 2 (two) times daily by mouth. 02/28/17   Carmon Ginsberg, PA  doxycycline (VIBRA-TABS) 100 MG tablet Take 1 tablet (100 mg total) 2 (two) times daily by mouth. 02/21/17   Carmon Ginsberg, PA  HYDROcodone-acetaminophen (NORCO) 5-325 MG tablet Take 1 tablet by mouth every 4 (four) hours as needed for severe pain. 07/26/17   Sherwood Gambler, MD  meloxicam (MOBIC) 15 MG tablet TAKE 1 TABLET BY MOUTH UP TO ONCE A DAY AS NEEDED FOR PAIN 01/01/19   Jerrol Banana., MD  ondansetron (ZOFRAN) 8 MG tablet Take 1 tablet (8 mg total) every 8 (eight) hours as needed by mouth for nausea or vomiting. 02/19/17   Carmon Ginsberg, PA  sildenafil (REVATIO) 20 MG tablet Take 1 tablet (20 mg total) by mouth 3 (three) times daily. 05/22/16   Jerrol Banana., MD   Allergies  Allergen Reactions  . Bee Venom Shortness Of Breath and Swelling  . Adhesive [Tape]     Redness and raw      FAMILY HISTORY:  family history includes Dementia in his father. SOCIAL HISTORY:  reports that he has never smoked. He has never used smokeless tobacco. He reports current alcohol use. He reports that he does not use drugs.    Review of Systems:  Gen:  Denies  fever, sweats, chills weigh loss  HEENT: Denies blurred vision, double vision, ear pain, eye pain, hearing loss, nose bleeds, sore throat Cardiac:  No dizziness, chest pain or heaviness, chest tightness,edema, No JVD Resp:   +  cough, +sputum production, -shortness of breath,+wheezing, -hemoptysis,  Gi: Denies swallowing difficulty, stomach pain, nausea or vomiting, diarrhea, constipation, bowel incontinence Gu:  Denies bladder incontinence, burning urine Ext:   Denies Joint pain, stiffness or swelling Skin: Denies  skin rash, easy bruising or bleeding or hives Endoc:  Denies polyuria, polydipsia , polyphagia or weight change Psych:   Denies depression, insomnia or hallucinations  Other:  All other systems negative   BP 140/88   Pulse 83   Temp  97.9 F (36.6 C) (Temporal)   Ht 5\' 9"  (1.753 m)   Wt 195 lb 3.2 oz (88.5 kg)   SpO2 95%   BMI 28.83 kg/m     Physical Examination:   GENERAL:NAD, no fevers, chills, no weakness no fatigue HEAD: Normocephalic, atraumatic.  EYES: PERLA, EOMI No scleral icterus.  MOUTH: Moist mucosal membrane.  EAR, NOSE, THROAT: Clear without exudates. No external lesions.  NECK: Supple.  PULMONARY: CTA B/L no wheezing, +rhonchi, CARDIOVASCULAR: S1 and S2. Regular rate and rhythm. No murmurs GASTROINTESTINAL: Soft, nontender, nondistended. Positive bowel sounds.  MUSCULOSKELETAL: No swelling, clubbing, or edema.  NEUROLOGIC: No gross focal neurological deficits. 5/5 strength all extremities SKIN: No ulceration, lesions, rashes, or cyanosis.  PSYCHIATRIC: Insight, judgment intact. -depression -anxiety ALL OTHER ROS ARE NEGATIVE   MEDICATIONS: I have reviewed all medications and confirmed regimen as documented     The CXR was Independently Reviewed By Me Today CXR reviewed-no acute findings       ASSESSMENT AND PLAN SYNOPSIS  63 year old pleasant white male seen today for chronic productive cough findings consistent with chronic bronchitis in the setting of a non-smoker with some environmental exposure  At this time he will need further testing with pulmonary function testing as well as a CT of his chest based on the physical exam of right-sided unilateral rales and rhonchi  At this time I would also start Flovent therapy I would also recommend starting Protonix for GERD We will also obtain pulmonary function testing as well   COVID-19 EDUCATION: The signs and symptoms of COVID-19 were discussed with the patient and how to seek care for testing.  The importance of social distancing was discussed today. Hand Washing Techniques and avoid touching face was advised.     MEDICATION ADJUSTMENTS/LABS AND TESTS ORDERED: START FLOVENT 110 as prescribed  START PROTONIX FOR ACID  REFLUX  OBTAIN PFT's  OBTAIN CT CHEST    CURRENT MEDICATIONS REVIEWED AT LENGTH WITH PATIENT TODAY   Patient satisfied with Plan of action and management. All questions answered  Follow up in 4 -6 weeks   Story Vanvranken Patricia Pesa, M.D.  [facility]  Medical Director [place] Director [facility] Cardio-Pulmonary Department

## 2019-01-10 NOTE — Patient Instructions (Signed)
START FLOVENT 110 as prescribed  START PROTONIX FOR ACID REFLUX  OBTAIN PFT's  OBTAIN CT CHEST

## 2019-01-20 ENCOUNTER — Encounter

## 2019-01-20 ENCOUNTER — Institutional Professional Consult (permissible substitution): Admit: 2019-01-20 | Discharge: 2019-01-20 | Primary: Family Medicine

## 2019-01-20 ENCOUNTER — Telehealth: Payer: Self-pay | Admitting: Internal Medicine

## 2019-01-20 DIAGNOSIS — I5032 Chronic diastolic (congestive) heart failure: Secondary | ICD-10-CM

## 2019-01-20 NOTE — Progress Notes (Signed)
This note will not be viewable in MyChart.    SCHEDULED REMOTE CHECK     Preliminary Impression: All normal function.    Following/Billing MD: Dr. Devona Konig  Implanting/Servicing MD: Dr. Freddrick March was scheduled for a remote device check today. BIV 100%. Chronic AF on A. The device noted 1 nst episode. The device report was generated from the remote website, and the results were forwarded to the ordering provider for review. Please see Interrogation report for the final results.     [name], RN  01/20/2019  12:31 PM

## 2019-01-20 NOTE — Progress Notes (Signed)
This note will not be viewable in MyChart.    SCHEDULED REMOTE CHECK     Preliminary Impression: All normal function.    Following/Billing MD: Dr. Senfield  Implanting/Servicing MD: Dr. Senfield    [name] [name] was scheduled for a remote device check today. BIV 100%. Chronic AF on A. The device noted 1 nst episode. The device report was generated from the remote website, and the results were forwarded to the ordering provider for review. Please see Interrogation report for the final results.     Jame Seelig J Vennie Waymire, RN  01/20/2019  12:31 PM

## 2019-01-20 NOTE — Telephone Encounter (Signed)
I had LMOVM for pt to contact me to advise that his approval from Christella Scheuermann is still pending for his 01/21/2019 CT.  Instead of R/S CT, pt requested to hold off until Christella Scheuermann has reached a determination. Canceled CT and will R/S once [employer] approves CT. Pt is aware of the above. Nothing else needed at this time. Rhonda J Cobb

## 2019-01-20 NOTE — Telephone Encounter (Signed)
Pt is aware that covid test is not needed prior to CT. It appears that Suanne Marker had attempted to contact pt.  Call was transferred to [facility].

## 2019-01-21 ENCOUNTER — Ambulatory Visit: Payer: Managed Care, Other (non HMO)

## 2019-01-22 ENCOUNTER — Telehealth: Payer: Self-pay | Admitting: Internal Medicine

## 2019-01-22 NOTE — Telephone Encounter (Signed)
ok 

## 2019-01-22 NOTE — Telephone Encounter (Signed)
Spoke with pt's wife and she is aware that insurance denied CT Chest and that per Dr. Mortimer Fries will address on next OV which is 02/17/2019 after PFT on 02/11/2019.    Nothing else needed at this time. Rhonda J Cobb

## 2019-01-22 NOTE — Telephone Encounter (Signed)
[employer] Denied CT Chest due to pt not having taken any steroids for cough during the last three weeks and pt not being treated for gastric reflux  disease (GERD) during the last three weeks.    LMOVM for pt/wife that CT has been denied.    Just FYI for physician. Rhonda J Cobb

## 2019-01-22 NOTE — Telephone Encounter (Signed)
Physician aware and will address at next visit. CT Chest canceled. Rhonda J Cobb

## 2019-01-25 ENCOUNTER — Other Ambulatory Visit: Payer: Self-pay | Admitting: Family Medicine

## 2019-01-25 DIAGNOSIS — M1990 Unspecified osteoarthritis, unspecified site: Secondary | ICD-10-CM

## 2019-01-29 ENCOUNTER — Encounter: Payer: Self-pay | Admitting: Family Medicine

## 2019-01-29 ENCOUNTER — Other Ambulatory Visit: Payer: Self-pay

## 2019-01-29 ENCOUNTER — Ambulatory Visit (INDEPENDENT_AMBULATORY_CARE_PROVIDER_SITE_OTHER): Payer: Managed Care, Other (non HMO) | Admitting: Family Medicine

## 2019-01-29 VITALS — BP 145/88 | HR 90 | Temp 96.9°F | Resp 18 | Wt 195.0 lb

## 2019-01-29 DIAGNOSIS — J42 Unspecified chronic bronchitis: Secondary | ICD-10-CM | POA: Diagnosis not present

## 2019-01-29 DIAGNOSIS — R05 Cough: Secondary | ICD-10-CM

## 2019-01-29 DIAGNOSIS — I10 Essential (primary) hypertension: Secondary | ICD-10-CM

## 2019-01-29 DIAGNOSIS — R059 Cough, unspecified: Secondary | ICD-10-CM

## 2019-01-29 MED ORDER — AZITHROMYCIN 250 MG PO TABS: ORAL_TABLET | ORAL | 0 refills | Status: DC

## 2019-01-29 NOTE — Progress Notes (Signed)
Patient: Stephen Sosa. Male    DOB: [birth year]   63 y.o.   MRN: [id number] Visit Date: 01/29/2019  Today's Provider: Wilhemena Durie, MD   Chief Complaint  Patient presents with  . Cough   Subjective:     HPI  Patient is seeking a second opinion on a cough that he has been experiencing for a few months.  Instead of coming here for his cough a month ago he saw ENT and was treated for pneumonia and then saw pulmonary and was evaluated and treated.  He comes to [place] for a second opinion.  There is no weight loss, no hemoptysis.  Cough is worse in the morning.  He says he has some wheezing.  He has not had any Covid testing at all.  Cough is been going on a couple of months.  Allergies  Allergen Reactions  . Bee Venom Shortness Of Breath and Swelling  . Adhesive [Tape]     Redness and raw       Current Outpatient Medications:  .  fluticasone (FLOVENT HFA) 110 MCG/ACT inhaler, Inhale 2 puffs into the lungs 2 (two) times daily., Disp: 1 Inhaler, Rfl: 6 .  meloxicam (MOBIC) 15 MG tablet, TAKE 1 TABLET BY MOUTH UP TO ONCE A DAY AS NEEDED FOR PAIN, Disp: 30 tablet, Rfl: 0 .  pantoprazole (PROTONIX) 40 MG tablet, Take 1 tablet (40 mg total) by mouth daily., Disp: 30 tablet, Rfl: 1 .  guaiFENesin-codeine 100-10 MG/5ML syrup, ONE HALF TO ONE TEASPOON EVERY 4 6 HOURS AS NEEDED FOR COUGH., Disp: , Rfl:   Review of Systems  Constitutional: Negative.   Eyes: Negative.   Respiratory: Positive for cough.   Cardiovascular: Negative.   Gastrointestinal: Negative.   Musculoskeletal: Positive for arthralgias.  Allergic/Immunologic: Negative.   Hematological: Negative.   Psychiatric/Behavioral: Negative.     Social History   Tobacco Use  . Smoking status: Never Smoker  . Smokeless tobacco: Never Used  Substance Use Topics  . Alcohol use: Yes    Comment: 1-2 beer daily      Objective:   BP (!) 145/88 (BP Location: Right Arm, Patient Position: Sitting, Cuff Size: Normal)    Pulse 90   Temp (!) 96.9 F (36.1 C) (Temporal)   Resp 18   Wt 195 lb (88.5 kg)   SpO2 92%   BMI 28.80 kg/m  Vitals:   01/29/19 1532  BP: (!) 145/88  Pulse: 90  Resp: 18  Temp: (!) 96.9 F (36.1 C)  TempSrc: Temporal  SpO2: 92%  Weight: 195 lb (88.5 kg)  Body mass index is 28.8 kg/m.   Physical Exam Constitutional:      Appearance: Normal appearance.  HENT:     Head: Normocephalic and atraumatic.     Right Ear: Tympanic membrane and external ear normal.     Left Ear: Tympanic membrane and external ear normal.  Eyes:     General: No scleral icterus. Cardiovascular:     Rate and Rhythm: Normal rate and regular rhythm.     Heart sounds: Normal heart sounds.  Pulmonary:     Effort: Pulmonary effort is normal.     Comments: Crackles in both bases. Abdominal:     Palpations: Abdomen is soft.  Musculoskeletal:     Right lower leg: No edema.     Left lower leg: No edema.  Skin:    General: Skin is warm and dry.  Neurological:  General: No focal deficit present.     Mental Status: He is alert and oriented to person, place, and time.  Psychiatric:        Mood and Affect: Mood normal.        Behavior: Behavior normal.        Thought Content: Thought content normal.        Judgment: Judgment normal.      No results found for any visits on 01/29/19.     Assessment & Plan    1. Chronic bronchitis, unspecified chronic bronchitis type ([place]) Treated with Z-Pak.  Follow-up in a month. - azithromycin (ZITHROMAX) 250 MG tablet; Take as directed  Dispense: 6 tablet; Refill: 0 - CBC w/Diff/Platelet - Comp. Metabolic Panel (12)  2. Essential hypertension Blood pressure could  be up because patient feels bad.  We will treat as indicated. - CBC w/Diff/Platelet - Comp. Metabolic Panel (12) - TSH  3. Cough Pulse ox it is a day is 92%.  Is certainly possible patient had Covid 2 months ago.  Certainly too late to test now.  I do not think any imaging will be present.   Would not do antibody test at this time.  CT pending through pulmonary.      Cranford Mon, MD  [facility]

## 2019-01-29 NOTE — Patient Instructions (Signed)
1. Stop taking Flovent 2. Start Symbicort 1 puff twice a day

## 2019-01-31 ENCOUNTER — Other Ambulatory Visit: Payer: Self-pay | Admitting: Family Medicine

## 2019-02-01 ENCOUNTER — Other Ambulatory Visit: Payer: Self-pay | Admitting: Internal Medicine

## 2019-02-01 DIAGNOSIS — R05 Cough: Secondary | ICD-10-CM

## 2019-02-01 DIAGNOSIS — R059 Cough, unspecified: Secondary | ICD-10-CM

## 2019-02-01 LAB — CBC WITH DIFFERENTIAL/PLATELET
Basophils Absolute: 0.1 10*3/uL (ref 0.0–0.2)
Basos: 1 %
EOS (ABSOLUTE): 0.2 10*3/uL (ref 0.0–0.4)
Eos: 2 %
Hematocrit: 48.5 % (ref 37.5–51.0)
Hemoglobin: 17.1 g/dL (ref 13.0–17.7)
Immature Grans (Abs): 0 10*3/uL (ref 0.0–0.1)
Immature Granulocytes: 0 %
Lymphocytes Absolute: 2.9 10*3/uL (ref 0.7–3.1)
Lymphs: 29 %
MCH: 31.6 pg (ref 26.6–33.0)
MCHC: 35.3 g/dL (ref 31.5–35.7)
MCV: 90 fL (ref 79–97)
Monocytes Absolute: 0.5 10*3/uL (ref 0.1–0.9)
Monocytes: 5 %
Neutrophils Absolute: 6.4 10*3/uL (ref 1.4–7.0)
Neutrophils: 63 %
Platelets: 289 10*3/uL (ref 150–450)
RBC: 5.41 x10E6/uL (ref 4.14–5.80)
RDW: 12.4 % (ref 11.6–15.4)
WBC: 10 10*3/uL (ref 3.4–10.8)

## 2019-02-01 LAB — COMP. METABOLIC PANEL (12)
AST: 19 IU/L (ref 0–40)
Albumin/Globulin Ratio: 1.7 (ref 1.2–2.2)
Albumin: 4.5 g/dL (ref 3.8–4.8)
Alkaline Phosphatase: 60 IU/L (ref 39–117)
BUN/Creatinine Ratio: 19 (ref 10–24)
BUN: 23 mg/dL (ref 8–27)
Bilirubin Total: 0.7 mg/dL (ref 0.0–1.2)
Calcium: 9.6 mg/dL (ref 8.6–10.2)
Chloride: 105 mmol/L (ref 96–106)
Creatinine, Ser: 1.22 mg/dL (ref 0.76–1.27)
GFR calc Af Amer: 73 mL/min/{1.73_m2} (ref >59)
GFR calc non Af Amer: 63 mL/min/{1.73_m2} (ref >59)
Globulin, Total: 2.6 g/dL (ref 1.5–4.5)
Glucose: 110 mg/dL — ABNORMAL HIGH (ref 65–99)
Potassium: 4.9 mmol/L (ref 3.5–5.2)
Sodium: 142 mmol/L (ref 134–144)
Total Protein: 7.1 g/dL (ref 6.0–8.5)

## 2019-02-01 LAB — TSH: TSH: 1.53 u[IU]/mL (ref 0.450–4.500)

## 2019-02-03 ENCOUNTER — Telehealth: Payer: Self-pay | Admitting: Internal Medicine

## 2019-02-03 NOTE — Telephone Encounter (Signed)
Called and spoke to pt's spouse, Caren Griffins (DPR). Caren Griffins is questioning if pt should take Protonix.  I have her made Caren Griffins aware that per last OV note, pt should start Protonix.  She aware wanted to make DK aware that pt recently seen PCP and had labs.   Will route to DK to make him aware him aware.

## 2019-02-04 ENCOUNTER — Telehealth: Payer: Self-pay

## 2019-02-04 NOTE — Telephone Encounter (Signed)
-----   Message from Jerrol Banana., MD sent at 02/04/2019  8:10 AM EDT ----- Labs in normal range.

## 2019-02-04 NOTE — Telephone Encounter (Signed)
No answer

## 2019-02-07 ENCOUNTER — Other Ambulatory Visit: Payer: Self-pay

## 2019-02-07 ENCOUNTER — Telehealth: Payer: Self-pay | Admitting: Internal Medicine

## 2019-02-07 NOTE — Telephone Encounter (Signed)
Pt is aware of date/time of covid test.   

## 2019-02-10 ENCOUNTER — Other Ambulatory Visit: Payer: Self-pay

## 2019-02-10 ENCOUNTER — Other Ambulatory Visit
Admission: RE | Admit: 2019-02-10 | Discharge: 2019-02-10 | Disposition: A | Discharge status: Home / Self Care | Payer: Managed Care, Other (non HMO) | Source: Ambulatory Visit | Attending: Internal Medicine | Admitting: Internal Medicine

## 2019-02-10 DIAGNOSIS — Z01812 Encounter for preprocedural laboratory examination: Secondary | ICD-10-CM | POA: Insufficient documentation

## 2019-02-10 DIAGNOSIS — Z20828 Contact with and (suspected) exposure to other viral communicable diseases: Secondary | ICD-10-CM | POA: Insufficient documentation

## 2019-02-10 LAB — SARS CORONAVIRUS 2 (TAT 6-24 HRS): SARS Coronavirus 2: NEGATIVE

## 2019-02-11 ENCOUNTER — Ambulatory Visit: Payer: Managed Care, Other (non HMO) | Attending: Internal Medicine

## 2019-02-11 ENCOUNTER — Other Ambulatory Visit: Payer: Self-pay

## 2019-02-11 ENCOUNTER — Telehealth: Payer: Self-pay | Admitting: Internal Medicine

## 2019-02-11 DIAGNOSIS — R05 Cough: Secondary | ICD-10-CM

## 2019-02-11 DIAGNOSIS — R059 Cough, unspecified: Secondary | ICD-10-CM

## 2019-02-11 MED ORDER — ALBUTEROL SULFATE (2.5 MG/3ML) 0.083% IN NEBU
2.5000 mg | INHALATION_SOLUTION | Freq: Once | RESPIRATORY_TRACT | Status: AC
  Administered 2019-02-11: 2.5 mg via RESPIRATORY_TRACT
  Filled 2019-02-11: qty 3

## 2019-02-11 NOTE — Telephone Encounter (Signed)
Call returned to patient, he states his CT was cancelled due to insurance and he is wanting to get his CT r/s. He states he spoke with his insurance and they would be willing to approve the CT since he has tried and failed "some medication."

## 2019-02-12 NOTE — Telephone Encounter (Signed)
Called and spoke with patient and advised him that CT Chest was denied.  Pt had ENT send records over and a Reconsideration Appeal was sent in to Insurance.  Insurance still denied.  Message sent to DK and he stated that DK would address CT Chest at his next appointment.  Pt had PFT yesterday and appointment scheduled for pt to see Dr. Mortimer Fries on 02/17/2019 at 4:00. Pt is aware that CT will be readdressed at this upcoming visit.   Nothing else needed at this time. Rhonda J Cobb

## 2019-02-17 ENCOUNTER — Encounter: Payer: Self-pay | Admitting: Internal Medicine

## 2019-02-17 ENCOUNTER — Ambulatory Visit (INDEPENDENT_AMBULATORY_CARE_PROVIDER_SITE_OTHER): Payer: Managed Care, Other (non HMO) | Admitting: Internal Medicine

## 2019-02-17 ENCOUNTER — Other Ambulatory Visit: Payer: Self-pay

## 2019-02-17 VITALS — BP 128/74 | HR 77 | Temp 97.4°F | Ht 69.0 in | Wt 196.6 lb

## 2019-02-17 DIAGNOSIS — J309 Allergic rhinitis, unspecified: Secondary | ICD-10-CM

## 2019-02-17 DIAGNOSIS — J4520 Mild intermittent asthma, uncomplicated: Secondary | ICD-10-CM

## 2019-02-17 MED ORDER — BUDESONIDE-FORMOTEROL FUMARATE 160-4.5 MCG/ACT IN AERO: 1.0000 | INHALATION_SPRAY | Freq: Two times a day (BID) | RESPIRATORY_TRACT | 12 refills | Status: DC

## 2019-02-17 MED ORDER — BUDESONIDE 32 MCG/ACT NA SUSP: 2.0000 | Freq: Every day | NASAL | 1 refills | Status: DC

## 2019-02-17 NOTE — Progress Notes (Signed)
Name: Dusty Windholz. MRN: [id number] DOB: [birth year]     CONSULTATION DATE: 02/17/2019  REFERRING MD : Charolett Bumpers  CHIEF COMPLAINT:  Follow-up cough   HISTORY OF PRESENT ILLNESS: Patient previously seen in September 2020 For some reason CT of the chest was denied by the insurance company Chest x-ray did not show any significant changes in September 2020 Patient was started on Flovent Was also started on PPI medication Pulmonary function testing on 02/11/2019-ratio was borderline with mild obstructive pattern   It seems that cough is better Productive cough is better however still persistent Signs and symptoms of chronic bronchitis  Patient was given Z-Pak and Symbicort and this seems to have helped his symptoms Patient would like to try to come off the Symbicort to see what happens to his respiratory symptoms  No  exacerbation at this time No evidence of heart failure at this time No evidence or signs of infection at this time No respiratory distress No fevers, chills, nausea, vomiting, diarrhea No evidence of lower extremity edema No evidence hemoptysis  Patient does have intermittent allergic rhinitis with postnasal drip Patient does not want to take any type of antihistamines at this time   PAST MEDICAL HISTORY :   has a past medical history of Arthritis, Headache, Joint pain, Joint swelling, Macular degeneration, and Phlebitis.  has a past surgical history that includes Varicose vein surgery (Bilateral, 2003); Knee arthroscopy (Left); Joint replacement (Right, 2005); Hernia repair (2000); and Total knee arthroplasty (Left, 01/25/2015).    Review of Systems:  Gen:  Denies  fever, sweats, chills weight loss  HEENT: Denies blurred vision, double vision, ear pain, eye pain, hearing loss, nose bleeds, sore throat Cardiac:  No dizziness, chest pain or heaviness, chest tightness,edema, No JVD Resp:   No cough, -sputum production, -shortness of breath,-wheezing,  -hemoptysis,  Gi: Denies swallowing difficulty, stomach pain, nausea or vomiting, diarrhea, constipation, bowel incontinence Gu:  Denies bladder incontinence, burning urine Ext:   Denies Joint pain, stiffness or swelling Skin: Denies  skin rash, easy bruising or bleeding or hives Endoc:  Denies polyuria, polydipsia , polyphagia or weight change Psych:   Denies depression, insomnia or hallucinations  Other:  All other systems negative    BP 128/74 (BP Location: Left Arm, Cuff Size: Normal)   Pulse 77   Temp (!) 97.4 F (36.3 C) (Temporal)   Ht 5\' 9"  (1.753 m)   Wt 196 lb 9.6 oz (89.2 kg)   SpO2 95%   BMI 29.03 kg/m    Physical Examination:   GENERAL:NAD, no fevers, chills, no weakness no fatigue HEAD: Normocephalic, atraumatic.  EYES: PERLA, EOMI No scleral icterus.  NECK: Supple. No thyromegaly.  No JVD.  PULMONARY: CTA B/L no wheezing, rhonchi, crackles CARDIOVASCULAR: S1 and S2. Regular rate and rhythm. No murmurs GASTROINTESTINAL: Soft, nontender, nondistended. Positive bowel sounds.  MUSCULOSKELETAL: No swelling, clubbing, or edema.  NEUROLOGIC: No gross focal neurological deficits. 5/5 strength all extremities SKIN: No ulceration, lesions, rashes, or cyanosis.  PSYCHIATRIC: Insight, judgment intact. 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  The importance of social distancing was discussed today. Hand Washing Techniques and avoid touching face was advised.     MEDICATION ADJUSTMENTS/LABS AND TESTS ORDERED: Start Rhinocort therapy Symbicort 1 puff twice a day if symptoms return   CURRENT MEDICATIONS REVIEWED AT LENGTH WITH PATIENT TODAY   Patient satisfied with Plan of action and management. All questions answered  Follow up in 3 months   Kashena Novitski Patricia Pesa, M.D.  [facility]  Medical Director [place] Director [facility] Cardio-Pulmonary Department

## 2019-02-17 NOTE — Patient Instructions (Signed)
Stop Symbicort and assess respiratory status in 1 week Patient advised to restart Symbicort if patient has increased wheezing in chest congestion and cough  Start Rhinocort nasal spray

## 2019-02-19 ENCOUNTER — Other Ambulatory Visit: Payer: Self-pay | Admitting: Family Medicine

## 2019-02-19 ENCOUNTER — Telehealth: Payer: Self-pay | Admitting: Internal Medicine

## 2019-02-19 DIAGNOSIS — M1990 Unspecified osteoarthritis, unspecified site: Secondary | ICD-10-CM

## 2019-02-19 NOTE — Telephone Encounter (Signed)
Spoke to pt's spouse, Caren Griffins (DPR). Caren Griffins is requesting instructions on symbicort. I have relayed instructions to Caren Griffins as wrote on AVS.  Caren Griffins voiced her understanding. Nothing further is needed.   Editor: Flora Lipps, MD (Physician)    Stop Symbicort and assess respiratory status in 1 week Patient advised to restart Symbicort if patient has increased wheezing in chest congestion and cough  Start Rhinocort nasal spray

## 2019-02-28 ENCOUNTER — Other Ambulatory Visit: Payer: Self-pay | Admitting: Internal Medicine

## 2019-02-28 DIAGNOSIS — R059 Cough, unspecified: Secondary | ICD-10-CM

## 2019-02-28 DIAGNOSIS — R05 Cough: Secondary | ICD-10-CM

## 2019-03-11 ENCOUNTER — Ambulatory Visit: Payer: Self-pay | Admitting: Family Medicine

## 2019-03-21 NOTE — Progress Notes (Deleted)
       Patient: Stephen Sosa. Male    DOB: November 15, 1955   63 y.o.   MRN: [id number] Visit Date: 03/21/2019  Today's Provider: Wilhemena Durie, MD   No chief complaint on file.  Subjective:     HPI   Chronic bronchitis, unspecified chronic bronchitis type ([place]) From 01/29/2019-Treated with Z-Pak.  Follow-up in a month. Labs in normal range.  Essential hypertension From 01/29/2019-Labs in normal range.  Cough From 01/29/2019-Pulse ox it is a day is 92%.  Is certainly possible patient had Covid 2 months ago. Certainly too late to test now.  I do not think any imaging will be present.  Would not do antibody test at this time.  CT pending through pulmonary.  Allergies  Allergen Reactions  . Bee Venom Shortness Of Breath and Swelling  . Adhesive [Tape]     Redness and raw       Current Outpatient Medications:  .  budesonide (RHINOCORT AQUA) 32 MCG/ACT nasal spray, Place 2 sprays into both nostrils daily., Disp: 1 g, Rfl: 1 .  budesonide-formoterol (SYMBICORT) 160-4.5 MCG/ACT inhaler, Inhale 2 puffs into the lungs 2 (two) times daily., Disp: , Rfl:  .  budesonide-formoterol (SYMBICORT) 160-4.5 MCG/ACT inhaler, Inhale 1 puff into the lungs 2 (two) times daily., Disp: 1 Inhaler, Rfl: 12 .  guaiFENesin-codeine 100-10 MG/5ML syrup, ONE HALF TO ONE TEASPOON EVERY 4 6 HOURS AS NEEDED FOR COUGH., Disp: , Rfl:  .  meloxicam (MOBIC) 15 MG tablet, TAKE 1 TABLET BY MOUTH UP TO ONCE A DAY AS NEEDED FOR PAIN, Disp: 30 tablet, Rfl: 2 .  pantoprazole (PROTONIX) 40 MG tablet, TAKE 1 TABLET BY MOUTH EVERY DAY, Disp: 30 tablet, Rfl: 1  Review of Systems  Constitutional: Negative for appetite change, chills and fever.  Respiratory: Negative for chest tightness, shortness of breath and wheezing.   Cardiovascular: Negative for chest pain and palpitations.  Gastrointestinal: Negative for abdominal pain, nausea and vomiting.    Social History   Tobacco Use  . Smoking status: Never Smoker   . Smokeless tobacco: Never Used  Substance Use Topics  . Alcohol use: Yes    Comment: 1-2 beer daily      Objective:   There were no vitals taken for this visit. There were no vitals filed for this visit.There is no height or weight on file to calculate BMI.   Physical Exam   No results found for any visits on 03/24/19.     Assessment & Plan        Wilhemena Durie, MD  [facility]

## 2019-03-24 ENCOUNTER — Other Ambulatory Visit: Payer: Self-pay

## 2019-03-24 ENCOUNTER — Ambulatory Visit (INDEPENDENT_AMBULATORY_CARE_PROVIDER_SITE_OTHER): Payer: Managed Care, Other (non HMO) | Admitting: Family Medicine

## 2019-03-24 ENCOUNTER — Encounter: Payer: Self-pay | Admitting: Family Medicine

## 2019-03-24 ENCOUNTER — Other Ambulatory Visit: Payer: Self-pay | Admitting: Family Medicine

## 2019-03-24 VITALS — BP 145/87 | HR 59 | Temp 97.2°F | Resp 18 | Ht 69.0 in | Wt 199.2 lb

## 2019-03-24 DIAGNOSIS — Z1211 Encounter for screening for malignant neoplasm of colon: Secondary | ICD-10-CM | POA: Diagnosis not present

## 2019-03-24 DIAGNOSIS — Z125 Encounter for screening for malignant neoplasm of prostate: Secondary | ICD-10-CM

## 2019-03-24 DIAGNOSIS — Z0000 Encounter for general adult medical examination without abnormal findings: Secondary | ICD-10-CM

## 2019-03-24 NOTE — Progress Notes (Signed)
Patient: Stephen Lanser., Male    DOB: [birth year], 63 y.o.   MRN: [id number] Visit Date: 03/24/2019  Today's Provider: Wilhemena Durie, MD   Chief Complaint  Patient presents with  . Annual Exam  . Follow-up   Subjective:     Annual physical exam Stephen Sosa. is a 63 y.o. male who presents today for health maintenance and complete physical. He feels well. He reports exercising working at his job. He reports he is sleeping fairly well. Patient is married and has 2 sons and 1 granddaughter there is 78 years old. He does see Dr. Evorn Gong for chronic skin issues.  Cough is essentially resolved.  He is stopped his Symbicort and his rescue inhaler.  He has had no recent cough. ----------------------------------------------------------------- Chronic bronchitis, unspecified chronic bronchitis type ([place]) Treated with Z-Pak.  Follow-up in a month. - azithromycin (ZITHROMAX) 250 MG tablet; Take as directed  Dispense: 6 tablet; Refill: 0 - CBC w/Diff/Platelet - Comp. Metabolic Panel (12)  Essential hypertension Blood pressure could  be up because patient feels bad.  We will treat as indicated. - CBC w/Diff/Platelet - Comp. Metabolic Panel (12) - TSH  Cough Pulse ox it is a day is 92%.  Is certainly possible patient had Covid 2 months ago.  Certainly too late to test now.  I do not think any imaging will be present.  Would not do antibody test at this time.  CT pending through pulmonary.  Review of Systems  Constitutional: Negative.   HENT: Negative.   Eyes: Negative.   Respiratory: Negative.  Negative for shortness of breath.   Cardiovascular: Positive for leg swelling.  Gastrointestinal: Negative for diarrhea, nausea and vomiting.  Endocrine: Negative.   Genitourinary: Negative.   Musculoskeletal: Negative.   Skin: Negative.   Allergic/Immunologic: Negative.   Neurological: Negative.   Hematological: Negative.   Psychiatric/Behavioral: Negative.     Social  History      He  reports that he has never smoked. He has never used smokeless tobacco. He reports current alcohol use. He reports that he does not use drugs.       Social History   Socioeconomic History  . Marital status: Married    Spouse name: Not on file  . Number of children: Not on file  . Years of education: Not on file  . Highest education level: Not on file  Occupational History  . Not on file  Social Needs  . Financial resource strain: Not on file  . Food insecurity    Worry: Not on file    Inability: Not on file  . Transportation needs    Medical: Not on file    Non-medical: Not on file  Tobacco Use  . Smoking status: Never Smoker  . Smokeless tobacco: Never Used  Substance and Sexual Activity  . Alcohol use: Yes    Comment: 1-2 beer daily  . Drug use: No  . Sexual activity: Yes  Lifestyle  . Physical activity    Days per week: Not on file    Minutes per session: Not on file  . Stress: Not on file  Relationships  . Social Herbalist on phone: Not on file    Gets together: Not on file    Attends religious service: Not on file    Active member of club or organization: Not on file    Attends meetings of clubs or organizations: Not on file  Relationship status: Not on file  Other Topics Concern  . Not on file  Social History Narrative  . Not on file    Past Medical History:  Diagnosis Date  . Arthritis   . Headache    rarely  . Joint pain   . Joint swelling   . Macular degeneration    dry  . Phlebitis      Patient Active Problem List   Diagnosis Date Noted  . 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 Arthritis 12/24/2014    Past Surgical History:  Procedure Laterality Date  . HERNIA REPAIR  [id number]   umbilical hernia  . JOINT REPLACEMENT Right 2005   knee  . KNEE ARTHROSCOPY Left   . TOTAL KNEE ARTHROPLASTY Left 01/25/2015   Procedure: TOTAL KNEE ARTHROPLASTY;  Surgeon: Vickey Huger, MD;  Location: [place];  Service: Orthopedics;   Laterality: Left;  Marland Kitchen VARICOSE VEIN SURGERY Bilateral 2003   ligation and stripping    Family History        Family Status  Relation Name Status  . Mother  Alive  . Father  Deceased at age 75       Cause of death was dementia  . Sister  Alive  . Brother  Alive  . Son  Alive  . Son  Alive        His family history includes Dementia in his father.      Allergies  Allergen Reactions  . Bee Venom Shortness Of Breath and Swelling  . Adhesive [Tape]     Redness and raw       Current Outpatient Medications:  .  budesonide (RHINOCORT AQUA) 32 MCG/ACT nasal spray, Place 2 sprays into both nostrils daily., Disp: 1 g, Rfl: 1 .  budesonide-formoterol (SYMBICORT) 160-4.5 MCG/ACT inhaler, Inhale 2 puffs into the lungs 2 (two) times daily., Disp: , Rfl:  .  budesonide-formoterol (SYMBICORT) 160-4.5 MCG/ACT inhaler, Inhale 1 puff into the lungs 2 (two) times daily., Disp: 1 Inhaler, Rfl: 12 .  meloxicam (MOBIC) 15 MG tablet, TAKE 1 TABLET BY MOUTH UP TO ONCE A DAY AS NEEDED FOR PAIN, Disp: 30 tablet, Rfl: 2 .  pantoprazole (PROTONIX) 40 MG tablet, TAKE 1 TABLET BY MOUTH EVERY DAY, Disp: 30 tablet, Rfl: 1   Patient Care Team: Jerrol Banana., MD as PCP - General (Family Medicine)    Objective:    Vitals: BP (!) 145/87   Pulse (!) 59   Temp (!) 97.2 F (36.2 C) (Temporal)   Resp 18   Ht 5\' 9"  (1.753 m)   Wt 199 lb 3.2 oz (90.4 kg)   BMI 29.42 kg/m    Vitals:   03/24/19 1347  BP: (!) 145/87  Pulse: (!) 59  Resp: 18  Temp: (!) 97.2 F (36.2 C)  TempSrc: Temporal  Weight: 199 lb 3.2 oz (90.4 kg)  Height: 5\' 9"  (1.753 m)     Physical Exam Vitals signs reviewed.  Constitutional:      Appearance: Normal appearance.  HENT:     Head: Normocephalic and atraumatic.     Right Ear: Tympanic membrane and external ear normal.     Left Ear: Tympanic membrane and external ear normal.  Eyes:     General: No scleral icterus. Cardiovascular:     Rate and Rhythm: Normal  rate and regular rhythm.     Heart sounds: Normal heart sounds.  Pulmonary:     Effort: Pulmonary effort is normal.  Breath sounds: Normal breath sounds.  Abdominal:     Palpations: Abdomen is soft.  Musculoskeletal:     Right lower leg: Edema present.     Left lower leg: No edema.     Comments: Right leg is no edema left leg is trace edema. He has significant varicose veins in both legs, right greater than left.  He has had varicose vein surgery says in the past on the right leg.  Skin:    General: Skin is warm and dry.  Neurological:     General: No focal deficit present.     Mental Status: He is alert and oriented to person, place, and time.  Psychiatric:        Mood and Affect: Mood normal.        Behavior: Behavior normal.        Thought Content: Thought content normal.        Judgment: Judgment normal.      Depression Screen PHQ 2/9 Scores 03/24/2019 05/22/2016 12/24/2014  PHQ - 2 Score 0 0 0  PHQ- 9 Score 0 - -       Assessment & Plan:     Routine Health Maintenance and Physical Exam  Exercise Activities and Dietary recommendations Goals   None     Immunization History  Administered Date(s) Administered  . Influenza Split 01/13/2013  . Influenza,inj,Quad PF,6+ Mos 03/26/2017, 11/30/2018  . Influenza-Unspecified 11/30/2018  . Tdap 03/04/2013, 11/19/2018, 12/12/2018  . Zoster Recombinat (Shingrix) 11/19/2018, 12/12/2018, 02/09/2019, 03/09/2019    Health Maintenance  Topic Date Due  . Hepatitis C Screening  25-Sep-1955  . HIV Screening  08/07/1971  . COLONOSCOPY  08/07/2006  . TETANUS/TDAP  03/05/2023  . INFLUENZA VACCINE  Completed     Discussed health benefits of physical activity, and encouraged him to engage in regular exercise appropriate for his age and condition.    -------------------------------------------------------------------- 1. Encounter for annual physical exam Return to clinic 6 months. - Lipid panel - Ambulatory referral to  Gastroenterology - PSA  2. Colon cancer screening For to GI.  He agrees to screening colonoscopy.  He has never had one before. - Ambulatory referral to Gastroenterology  3. Prostate cancer screening  - PSA   Wilhemena Durie, MD  [facility]

## 2019-03-25 ENCOUNTER — Other Ambulatory Visit: Payer: Self-pay | Admitting: Internal Medicine

## 2019-03-25 DIAGNOSIS — R059 Cough, unspecified: Secondary | ICD-10-CM

## 2019-03-25 DIAGNOSIS — R05 Cough: Secondary | ICD-10-CM

## 2019-03-25 LAB — PSA: Prostate Specific Ag, Serum: 1.2 ng/mL (ref 0.0–4.0)

## 2019-03-25 LAB — LIPID PANEL
Chol/HDL Ratio: 1.9 ratio (ref 0.0–5.0)
Cholesterol, Total: 168 mg/dL (ref 100–199)
HDL: 89 mg/dL (ref >39)
LDL Chol Calc (NIH): 70 mg/dL (ref 0–99)
Triglycerides: 42 mg/dL (ref 0–149)
VLDL Cholesterol Cal: 9 mg/dL (ref 5–40)

## 2019-03-27 ENCOUNTER — Telehealth: Payer: Self-pay

## 2019-03-27 NOTE — Telephone Encounter (Signed)
Called patient to notify of labs with no answer, Was unable to LVM.

## 2019-03-27 NOTE — Telephone Encounter (Signed)
-----   Message from Jerrol Banana., MD sent at 03/27/2019  7:54 AM EST ----- Labs good.

## 2019-04-01 ENCOUNTER — Encounter: Payer: Self-pay | Admitting: *Deleted

## 2019-04-03 ENCOUNTER — Telehealth: Payer: Self-pay

## 2019-04-03 NOTE — Telephone Encounter (Signed)
Copied from [place] [id number]. Topic: General - Other >> Apr 03, 2019 11:26 AM Rainey Pines A wrote: Patient is requesting a callback from Dr. Marlan Palau nurse to go over lab results in depth. Please advise

## 2019-04-04 NOTE — Telephone Encounter (Signed)
Returned call to pt and advised results.

## 2019-04-04 NOTE — Telephone Encounter (Signed)
Pt is calling and he is aware it may take up to 24 hr etc to get return call

## 2019-04-07 NOTE — Telephone Encounter (Signed)
Has this message been addressed?

## 2019-04-07 NOTE — Telephone Encounter (Signed)
DK please advise on labs. Thanks

## 2019-04-07 NOTE — Telephone Encounter (Signed)
Lm for pt

## 2019-04-07 NOTE — Telephone Encounter (Signed)
I would confirm with ordering physician about labs I don't see anything abnormal but that needs to be verified by ordering doc

## 2019-04-09 NOTE — Telephone Encounter (Signed)
Pt is aware of aware of below recommendations and voiced her understanding. Nothing further is needed.

## 2019-04-15 ENCOUNTER — Ambulatory Visit: Payer: Managed Care, Other (non HMO)

## 2019-04-28 ENCOUNTER — Institutional Professional Consult (permissible substitution): Primary: Adult Health

## 2019-04-28 ENCOUNTER — Institutional Professional Consult (permissible substitution): Admit: 2019-04-28 | Discharge: 2019-04-28 | Primary: Family Medicine

## 2019-04-28 DIAGNOSIS — I5032 Chronic diastolic (congestive) heart failure: Secondary | ICD-10-CM

## 2019-04-28 NOTE — Progress Notes (Signed)
This note will not be viewable in MyChart.    SCHEDULED REMOTE CHECK     Preliminary Impression: Normal CRT-D function.  100% AT/AF, on Eliquis.  Preempt study patient.    Following/Billing MD: Dr. Devona Konig  Implanting/Servicing MD: Dr. Freddrick March was scheduled for a remote device check today. The device report was generated from the remote website, and the results were forwarded to the ordering provider for review. Please see Interrogation report for the final results.     [name] [name]  04/28/2019  9:39 AM

## 2019-04-28 NOTE — Progress Notes (Signed)
This note will not be viewable in MyChart.    SCHEDULED REMOTE CHECK     Preliminary Impression: Normal CRT-D function.  100% AT/AF, on Eliquis.  Preempt study patient.    Following/Billing MD: Dr. Senfield  Implanting/Servicing MD: Dr. Senfield    [name] [name] was scheduled for a remote device check today. The device report was generated from the remote website, and the results were forwarded to the ordering provider for review. Please see Interrogation report for the final results.     [name] [name]  04/28/2019  9:39 AM

## 2019-05-16 IMAGING — CT CT RENAL STONE PROTOCOL
2 of 4 series · 17 of 46 positions shown, 19 images · non-contrast
Comparison: None.

CLINICAL DATA: Left flank pain

EXAM:
CT ABDOMEN AND PELVIS WITHOUT CONTRAST
TECHNIQUE: Multidetector CT imaging of the abdomen and pelvis was performed
following the standard protocol without IV contrast.

[Series 3: renal stone 5.0 · axial · 0.88mm/px · z∈[+742,+1172]mm · 14 of 96 slices shown, 16 images]
[im 5/96  soft-tissue]
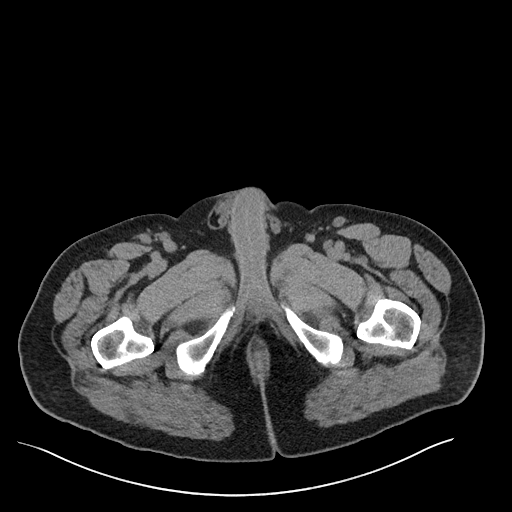
[im 5/96  bone]
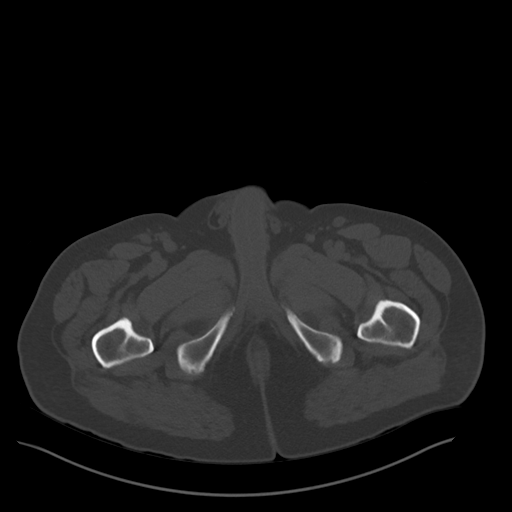
[im 13/96  soft-tissue]
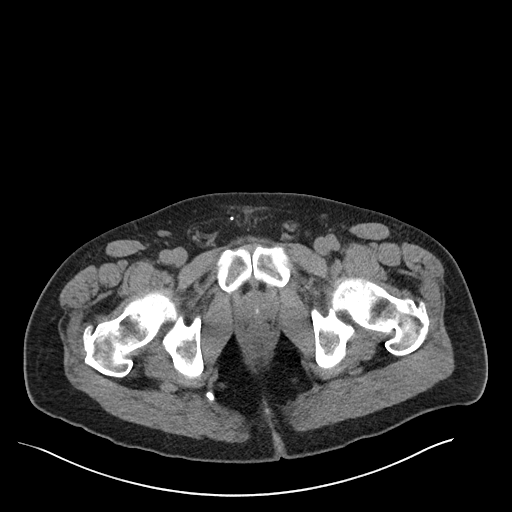
[im 17/96  soft-tissue]
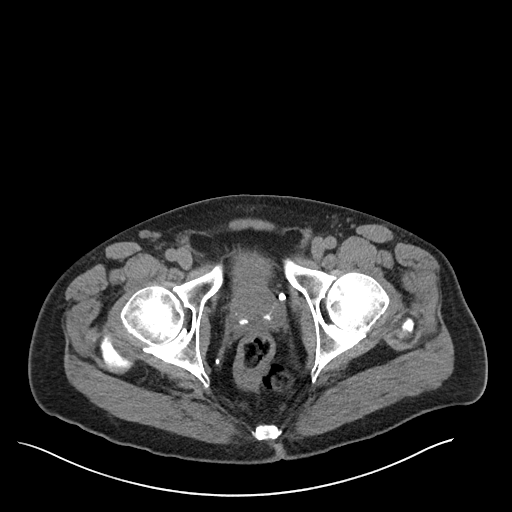
[im 25/96  soft-tissue]
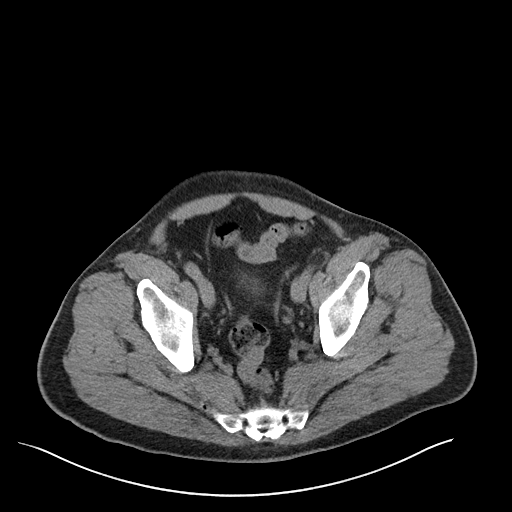
[im 34/96  soft-tissue]
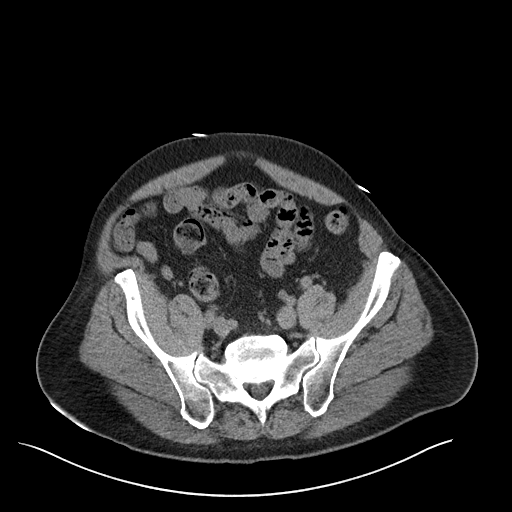
[im 38/96  soft-tissue]
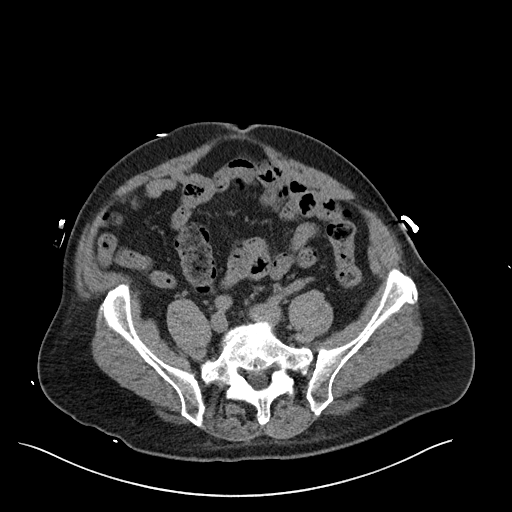
[im 46/96  soft-tissue]
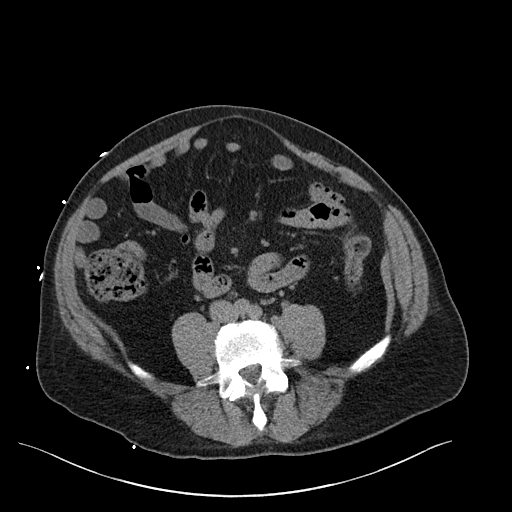
[im 50/96  soft-tissue]
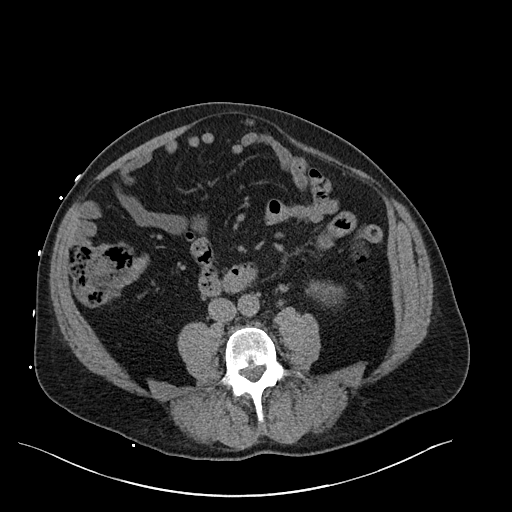
[im 58/96  soft-tissue]
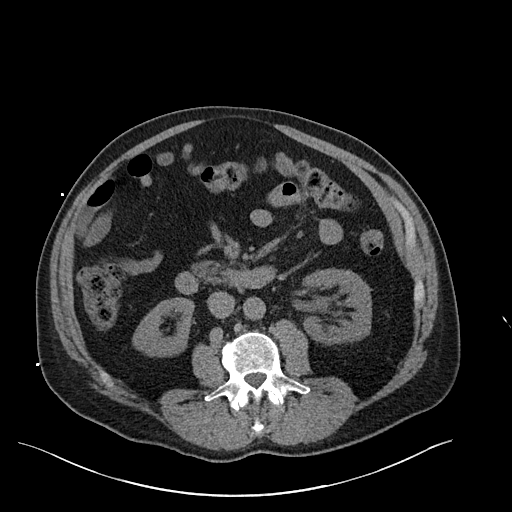
[im 58/96  bone]
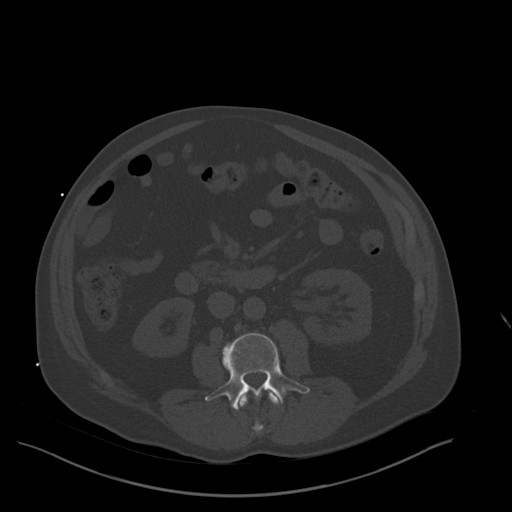
[im 62/96  soft-tissue]
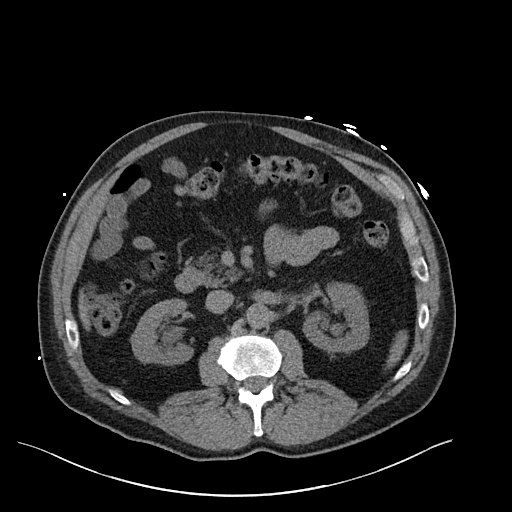
[im 71/96  soft-tissue]
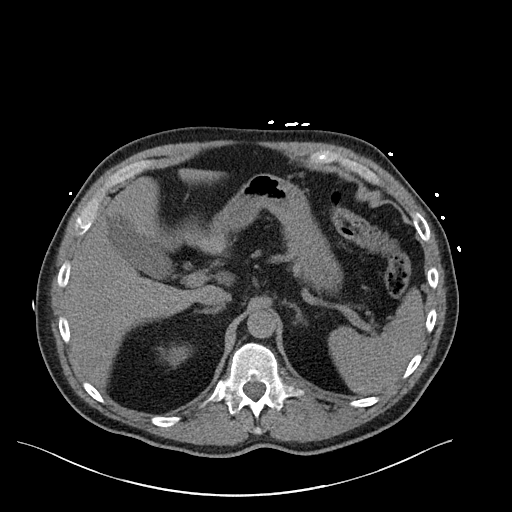
[im 79/96  soft-tissue]
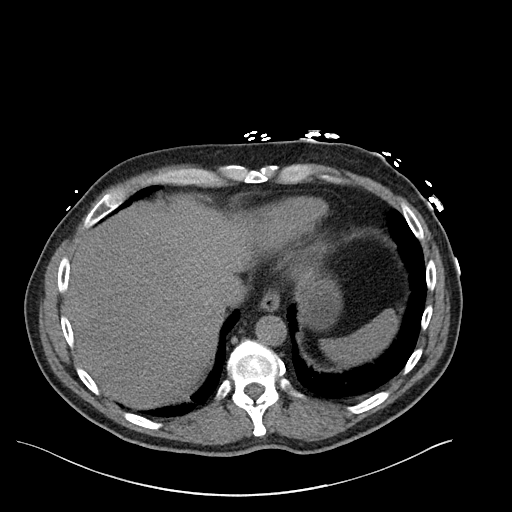
[im 83/96  soft-tissue]
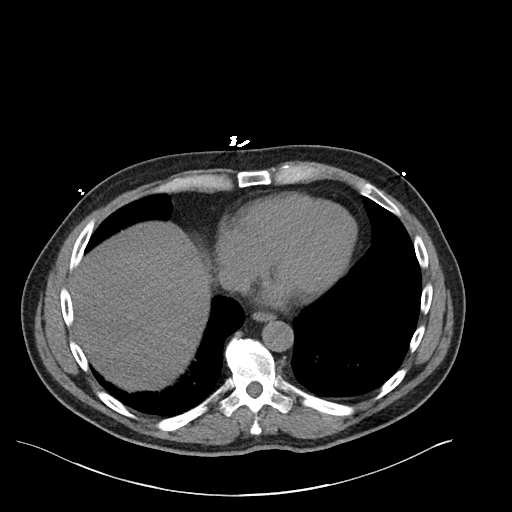
[im 91/96  soft-tissue]
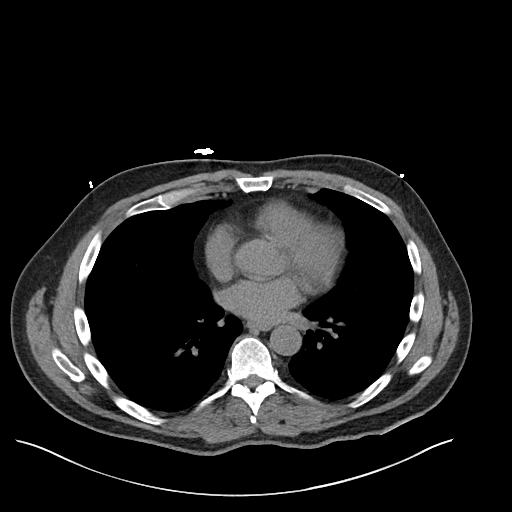

[Series 5: renal stone 3.0 cor · coronal · 0.79mm/px · 3 of 112 slices shown]
[im 38/112  soft-tissue]
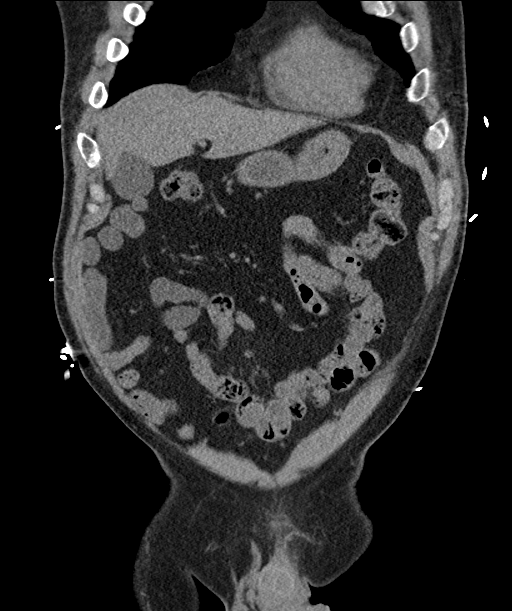
[im 50/112  soft-tissue]
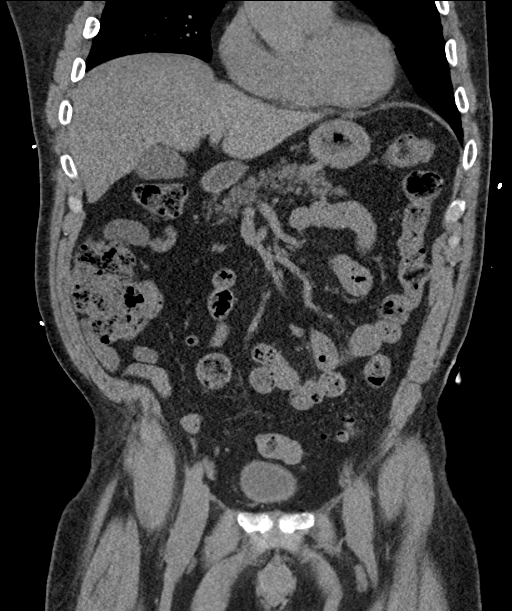
[im 62/112  soft-tissue]
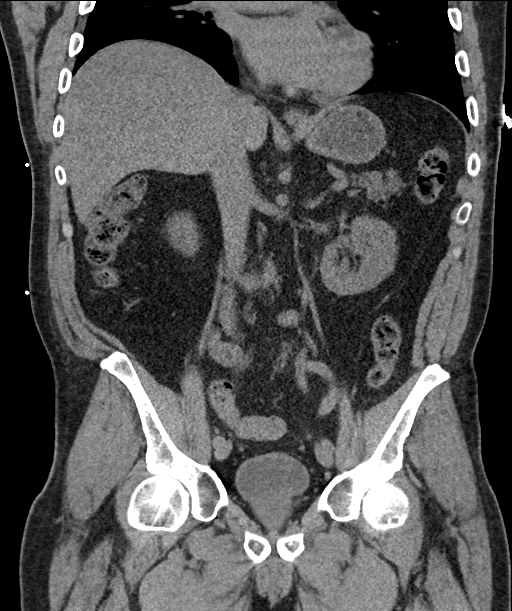

[17 of 46 positions shown; findings below may reference images not displayed]

FINDINGS: Lower chest: Dependent atelectasis in the lower lobes. Heart is
normal size. No effusions.

Hepatobiliary: No focal hepatic abnormality. Gallbladder
unremarkable.

Pancreas: No focal abnormality or ductal dilatation.

Spleen: Calcification within the spleen.  Normal size.

Adrenals/Urinary Tract: Mild left hydronephrosis and hydroureter due
to a 3 mm left UVJ stone. No hydronephrosis or stones on the right.
Adrenal glands and urinary bladder unremarkable.

Stomach/Bowel: Appendix is normal. Stomach, large and small bowel
grossly unremarkable.

Vascular/Lymphatic: Aortic atherosclerosis. No enlarged abdominal or
pelvic lymph nodes.

Reproductive: Prostate calcifications and mild prominence of the
prostate.

Other: No free fluid or free air.

Musculoskeletal: No acute bony abnormality
IMPRESSION: 3 mm left UVJ stone with mild left hydronephrosis.

Mildly prominent prostate with central calcifications.

Minimal dependent atelectasis.

## 2019-05-21 ENCOUNTER — Telehealth: Payer: Self-pay

## 2019-05-21 NOTE — Telephone Encounter (Signed)
Copied from [place] [id number]. Topic: General - Other >> May 21, 2019  1:47 PM Rainey Pines A wrote: Patients spouse called and stated that patient is scheduled to get colonoscopy and the office still needs referral sent over. Dr. Penelope Coop fax [phone] put to the attn of Referrals

## 2019-05-22 NOTE — Telephone Encounter (Signed)
Please advise 

## 2019-05-23 NOTE — Telephone Encounter (Signed)
Referral sent to Dr Anson Fret 05/23/19. Their office will contact pt

## 2019-05-25 ENCOUNTER — Other Ambulatory Visit: Payer: Self-pay | Admitting: Internal Medicine

## 2019-05-25 DIAGNOSIS — R059 Cough, unspecified: Secondary | ICD-10-CM

## 2019-05-25 DIAGNOSIS — R05 Cough: Secondary | ICD-10-CM

## 2019-05-26 NOTE — Telephone Encounter (Signed)
Pt reports SOB. States not all the time and random times. Last OV 05/2018. I asked pt if he would like to come in to see Dr Devona Konig and discuss. Pt is agreeable. Scheduled in 05-27-19 @ 1330/MA.

## 2019-05-26 NOTE — Telephone Encounter (Signed)
[name] [name], [name] [place], MD 4 hours ago (9:59 AM)        Dr Devona Konig,  I have been experiencing some shortness of breath and I read that Metoprolol or Losartan  may have this side effect.  Can you advise?  ??  The pacemaker seems to be working well.  Thank you,  [name] [name]

## 2019-05-27 ENCOUNTER — Ambulatory Visit: Attending: Cardiovascular Disease | Primary: Family Medicine

## 2019-05-27 ENCOUNTER — Institutional Professional Consult (permissible substitution): Primary: Adult Health

## 2019-05-27 ENCOUNTER — Ambulatory Visit: Admit: 2019-05-27 | Discharge: 2019-05-27 | Attending: Cardiovascular Disease | Primary: Family Medicine

## 2019-05-27 ENCOUNTER — Institutional Professional Consult (permissible substitution): Admit: 2019-05-27 | Primary: Family Medicine

## 2019-05-27 DIAGNOSIS — I2510 Atherosclerotic heart disease of native coronary artery without angina pectoris: Secondary | ICD-10-CM

## 2019-05-27 DIAGNOSIS — I5032 Chronic diastolic (congestive) heart failure: Secondary | ICD-10-CM

## 2019-05-27 MED ORDER — CLOPIDOGREL 75 MG TAB
75 mg | ORAL_TABLET | Freq: Every day | ORAL | 3 refills | Status: AC
Start: 2019-05-27 — End: ?

## 2019-05-27 MED ORDER — LOSARTAN 25 MG TAB
25 mg | ORAL_TABLET | Freq: Every day | ORAL | 3 refills | Status: AC
Start: 2019-05-27 — End: ?

## 2019-05-27 MED ORDER — FUROSEMIDE 40 MG TAB
40 mg | ORAL_TABLET | Freq: Every day | ORAL | 3 refills | Status: AC | PRN
Start: 2019-05-27 — End: ?

## 2019-05-27 MED ORDER — APIXABAN 5 MG TABLET
5 mg | ORAL_TABLET | Freq: Two times a day (BID) | ORAL | 11 refills | Status: AC
Start: 2019-05-27 — End: ?

## 2019-05-27 MED ORDER — METOPROLOL SUCCINATE SR 100 MG 24 HR TAB
100 mg | ORAL_TABLET | Freq: Two times a day (BID) | ORAL | 3 refills | Status: DC
Start: 2019-05-27 — End: 2019-05-27

## 2019-05-27 MED ORDER — POTASSIUM CHLORIDE SR 10 MEQ TAB, PARTICLES/CRYSTALS
10 mEq | ORAL_TABLET | Freq: Two times a day (BID) | ORAL | 3 refills | Status: AC | PRN
Start: 2019-05-27 — End: ?

## 2019-05-27 MED ORDER — METOPROLOL SUCCINATE SR 100 MG 24 HR TAB
100 mg | ORAL_TABLET | Freq: Every day | ORAL | 3 refills | Status: AC
Start: 2019-05-27 — End: ?

## 2019-05-27 NOTE — Progress Notes (Signed)
This note will not be viewable in MyChart.    IN OFFICE CHECK    Preliminary Impression: All normal function. No changes were made.    Following MD: Dr. Devona Konig  Implanting MD: Dr. Freddrick March presented to the [facility] today for a routine follow up. There were 0 episodes recorded. No changes were made. Normal device function. The device was interrogated, and the results were forwarded to the ordering provider for review. Please see Interrogation report for the final results. This was an added on check for the day per Dr. Devona Konig.    [name] [name]  05/27/2019  3:33 PM

## 2019-05-27 NOTE — Progress Notes (Signed)
Progress Notes by Rhett Bannister, MD at 05/27/19 1330                Author: Rhett Bannister, MD  Service: --  Author Type: Physician       Filed: 05/27/19 1350  Encounter Date: 05/27/2019  Status: Signed          Editor: Rhett Bannister, MD (Physician)                          [facility]   [address], [address]   [place], [place] [address]   PHONE: [phone]            05/27/19            NAME:  [name] [name]   DOB: 03/02/1956   MRN: [id number]       Follow Up       ASSESSMENT and PLAN:   Diagnoses and all orders for this visit:      1. CAD       2. Thrombus of left atrial appendage      3. Essential hypertension      4. Atherosclerosis of native coronary artery of native heart with stable angina pectoris (HCC)      5. Atrial fibrillation with RVR (HCC)      6. Anterior myocardial infarction (HCC)      7. Dyslipidemia      8. Stroke 5/235       64 year old male with persistent AF with finding of LAA thrombus and reduced EF. He is s/p CRT-D/AVJ ablation. For the most part he feels well.  Mild dyspnea. He has gained 30 lbs since last year which is likely 2/2 change in lifestyle.       -Reviewed device interrogation, stable. Heart logic with very low HF score (good sign).    -Continue GDMT, will reduce to metoprolol to 50 mg po daily. Consider ARB titration next OV.    -Encouraged weight loss.    -Echo next OV.    -Continue Eliquis.          TOTAL TIME: 25 minutes, >50% during counseling and coordination of care      .Thank you for allowing me to participate in the electrophysiologic care of [name] [name]. Please contact me if any questions or concerns were to arise.      [name]. Akyla Vavrek MD, MS   Clinical Cardiac Electrophysiology   [facility]   05/27/19   3:16 PM      ===================================================================   Chief Complant:       Chief Complaint       Patient presents with        ?  Annual Exam        ?  Shortness of Breath             Consultation is requested by Rhett Bannister for  evaluation of Annual Exam and Shortness of Breath         History:   [name] [name] is a most pleasant 64 y.o.  male with a past medical and cardiac history significant for anterior MI, CVA (May 2019, received TPA) , HTN, CAD, dyslipidemia, AFIB and HTN  who underwent TEE on 11/05/17. He was found to have a reduced EF of 25% with anterior apical akinesis. He was found to have an LA thrombus that was confirmed with definity. Patient was admitted  and started on IV heparin with bridge to warfarin.       He comes in for followup. He is now s/p CRT-D/AVJ and feels well. Mild dyspnea, but controlled. Weight gain likely to excess calories, not HF.  The patient otherwise denies chest pain, dyspnea, presyncope, syncope or lateralizing symptoms.      Father died from complications of COVID in 2020.       Cardiac PMH: (Old records have been reviewed and summarized below)      EKG:  (EKG has been independently visualized by me with interpretation below): Atrial fibrillation with  mild tachycardia.       ECHO: 11/05/2017   - ??Left ventricle: The ventricle was dilated. Systolic function was markedly  reduced. Ejection fraction was estimated in the range of 15 % to 20 %.     - ??Right ventricle: Systolic function was reduced.    - ??Left atrium: The atrium was dilated.  - ??Left atrial appendage: There was continuous spontaneous echo contrast   ("smoke") in the appendage. There was thrombus noted in the LAA. Due to  thrombus cardioversion was not performed.    - ??Atrial septum: Appeared to be a PFO by color doppler.     - ??Right atrium: The atrium was dilated.    - ??Mitral valve: There was mild regurgitation.    - ??Tricuspid valve: There was mild regurgitation.    - ??Aorta, systemic arteries: There was mild atheroma.      Previous Heart Catheterization: 09/2010   CORONARY ARTERIOGRAMS   1. Both the right and left coronaries were large and patulous   proximally.   2. The left main  coronary was quite large, was huge, and then tapered to   the LAD, circumflex systems.   3. The LAD coronary had evidence of proximal stenting which emanated from   the left main. The stented segment appeared widely patent with a good   inflow and outflow and no significant disruption. No luminal narrowing   and TIMI-3 flow in the distal LAD. There were no significant mid LAD   stenoses.   4. The circumflex coronary artery provided a ramus intermedius and a   moderate mid vessel marginal. The ramus intermedius was tortuous but   appeared free of significant disease. There was a high diagonal as well   which was tortuous and paralleled the ramus. It was small in caliber and   extent, was diffusely irregular but no high-grade stenosis.   5. The circumflex coronary artery contained minor irregularity proximally   and then had a 40-50% stenosis. It was ectatic before it gave rise to the   mid vessel marginal. The mid vessel marginal had 50% stenosis in its   origin. The circumflex after the marginal origin contained an 80%   stenosis but was very limited in the distal circulation. On review of the   left system anatomy. There was no significant change from that previously   in October 2011.   6. The right coronary artery was also patulous and irregular, large   caliber trunk, tortuous with a mid vessel 40% stenosis, with a small   button of ulceration. Then was diffusely irregular throughout its distal   half. There was evidence of previous stenting from the distal right into   the PDA. The stented segment was widely patent. There was good luminal   appearance inflow and outflow to a large PDA. There was a subsegmental   PDA which was patent and had  minor irregularity throughout. This was much   smaller in caliber than the primary PDA.   7. 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   ??   IMPRESSION   1. Stable  pattern of diffuse coronary disease as described.   2. Mild reduction in left ventricular systolic function with an apical   wall motion abnormality.      Stress Test: n/a       DEVICE INTERROGATION: BSC, implanted 06/2019. Stable lead parameters, BIV paced 100%.       Past Medical History, Past Surgical History, Family history, Social History, and Medications were all reviewed with the patient today and updated as necessary.         Current Outpatient Medications          Medication  Sig  Dispense  Refill           ?  losartan (COZAAR) 25 mg tablet  Take 1 Tab by mouth daily.  90 Tab  3     ?  metoprolol succinate (TOPROL-XL) 100 mg tablet  Take 1 Tab by mouth two (2) times a day.  180 Tab  3     ?  clopidogreL (PLAVIX) 75 mg tab  Take 1 Tab by mouth daily.  90 Tab  3     ?  apixaban (ELIQUIS) 5 mg tablet  Take 1 Tab by mouth two (2) times a day.  60 Tab  11     ?  furosemide (LASIX) 40 mg tablet  Take 1 Tab by mouth daily as needed (PRN).  90 Tab  3     ?  potassium chloride (KLOR-CON) 10 mEq tablet  Take 1 Tab by mouth two (2) times daily as needed (takes with lasix).  90 Tab  3     ?  OTHER  antronex daily as needed for allergies               ?  nitroglycerin (NITROSTAT) 0.4 mg SL tablet  1 Tab by SubLINGual route as needed for Chest Pain. Up to 3 doses.  1 Bottle  5          Allergies        Allergen  Reactions         ?  Aspirin  Hives     ?  Gluten  Other (comments)             Abdominal pain and hip pain         ?  Tylenol [Acetaminophen]  Hives          Patient Active Problem List          Diagnosis        ?  Status post biventricular pacemaker     ?  Anterior myocardial infarction [facility])     ?  Essential hypertension, benign     ?  Coronary atherosclerosis of native coronary artery     ?  Dyslipidemia     ?  Atrial fibrillation (HCC)     ?  Dilated cardiomyopathy (HCC)     ?  Atrial fibrillation with RVR (HCC)     ?  Thrombus of left atrial appendage     ?  Chest pain, unspecified     ?  Unstable angina  (HCC)     ?  Hypertension        ?  CAD (coronary artery disease)             Past Medical History:        Diagnosis  Date         ?  Atrial fibrillation with RVR (HCC)  11/05/2017     ?  CAD (coronary artery disease)       ?  CVA (cerebral vascular accident) (HCC)  09/05/2017     ?  Dyslipidemia       ?  Hypertension           ?  Unstable angina (HCC)  02/02/2010          Past Surgical History:         Procedure  Laterality  Date          ?  PR CARDIAC SURG PROCEDURE UNLIST    11/2009          stent x1 LAD           ?  PR LEFT HEART CATH,PERCUTANEOUS    02/02/2010          stent x1        No family history on file.     Social History          Tobacco Use         ?  Smoking status:  Never Smoker     ?  Smokeless tobacco:  Never Used       Substance Use Topics         ?  Alcohol use:  No           ROS:  A comprehensive review of systems was performed with the pertinent positives and negatives as noted in the HPI in addition to:      Review of Systems    Constitutional: Negative.     HENT: Negative.     Eyes: Negative.     Respiratory: Negative.     Cardiovascular: Negative.     Gastrointestinal: Negative.     Genitourinary: Negative.     Musculoskeletal: Negative.     Skin: Negative.     Neurological: Negative.     Endo/Heme/Allergies: Negative.     Psychiatric/Behavioral: Negative.        PHYSICAL EXAM:       Visit Vitals      BP  (!) 146/88     Pulse  71     Ht  6\' 4"  (1.93 m)     Wt  283 lb (128.4 kg)        BMI  34.45 kg/m??              Wt Readings from Last 3 Encounters:        05/27/19  283 lb (128.4 kg)     07/02/18  260 lb 8 oz (118.2 kg)        06/12/18  255 lb (115.7 kg)          BP Readings from Last 3 Encounters:        05/27/19  (!) 146/88     07/02/18  131/88        07/01/18  (!) 115/97           Gen: Well appearing, well developed, no acute distress   Eyes: Pupils equal, round. Extraocular movements are intact   ENT: Oropharynx clear, no oral lesions, normal dentition   CV: S1S2, IRRR, tachy, no murmurs,  rubs or gallops, normal JVD, no  carotid bruits, normal distal pulses, no LEE, left sided CIED C/D/I.    Pulm: Clear to auscultation bilaterally, no accessory muscle uses, no wheezes or rales   GI: Soft, NT, ND, +BS   Neuro: Alert and oriented, nonfocal   Psych: Appropriate affect   Skin: Normal color and skin turgor   MSK: Normal muscle bulk and tone      Medical problems and test results were reviewed with the patient today.       No results found for any visits on 05/27/19.        Lab Results         Component  Value  Date/Time            Potassium  4.4  06/28/2018 11:10 AM          Lab Results         Component  Value  Date/Time            Creatinine  1.00  06/28/2018 11:10 AM          Lab Results         Component  Value  Date/Time            HGB  15.6  06/28/2018 11:10 AM          Lab Results         Component  Value  Date/Time            INR  1.0  06/28/2018 11:10 AM       INR  1.0  06/04/2018 06:35 AM       INR  1.1  12/18/2017 09:19 AM       Prothrombin time  13.2  06/28/2018 11:10 AM       Prothrombin time  13.3  06/04/2018 06:35 AM            Prothrombin time  14.6 (H)  12/18/2017 09:19 AM

## 2019-05-27 NOTE — Progress Notes (Signed)
This note will not be viewable in MyChart.    IN OFFICE CHECK    Preliminary Impression: All normal function. No changes were made.    Following MD: Dr. Senfield  Implanting MD: Dr. Senfield    Brook C Grenier presented to the Device Clinic today for a routine follow up. There were 0 episodes recorded. No changes were made. Normal device function. The device was interrogated, and the results were forwarded to the ordering provider for review. Please see Interrogation report for the final results. This was an added on check for the day per Dr. Senfield.    Beth Wilson  05/27/2019  3:33 PM

## 2019-05-27 NOTE — Progress Notes (Signed)
[facility]  [address], [address]  [place], [place] [address]  PHONE: [phone]        05/27/19        NAME:  [name] [name]  DOB: 02-14-1956  MRN: [id number]     Follow Up     ASSESSMENT and PLAN:  Diagnoses and all orders for this visit:    1. CAD     2. Thrombus of left atrial appendage    3. Essential hypertension    4. Atherosclerosis of native coronary artery of native heart with stable angina pectoris (HCC)    5. Atrial fibrillation with RVR (HCC)    6. Anterior myocardial infarction (HCC)    7. Dyslipidemia    8. Stroke 08/6355     64 year old male with persistent AF with finding of LAA thrombus and reduced EF. He is s/p CRT-D/AVJ ablation. For the most part he feels well. Mild dyspnea. He has gained 30 lbs since last year which is likely 2/2 change in lifestyle.     -Reviewed device interrogation, stable. Heart logic with very low HF score (good sign).   -Continue GDMT, will reduce to metoprolol to 50 mg po daily. Consider ARB titration next OV.   -Encouraged weight loss.   -Echo next OV.   -Continue Eliquis.       TOTAL TIME: 25 minutes, >50% during counseling and coordination of care    .Thank you for allowing me to participate in the electrophysiologic care of Mr. BLAIN HUNSUCKER. Please contact me if any questions or concerns were to arise.    [name]. Jennye Runquist MD, MS  Clinical Cardiac Electrophysiology  [facility]  05/27/19  3:16 PM    ===================================================================  Chief Complant:    Chief Complaint   Patient presents with   ??? Annual Exam   ??? Shortness of Breath        Consultation is requested by Rhett Bannister for evaluation of Annual Exam and Shortness of Breath      History:   [name] [name] is a most pleasant 64 y.o. male with a past medical and cardiac history significant for anterior MI, CVA (May 2019, received TPA) , HTN, CAD, dyslipidemia, AFIB and HTN who underwent TEE on 11/05/17. He was found to have a reduced EF of 25% with anterior apical akinesis. He was found to have an LA thrombus that was confirmed with definity. Patient was admitted and started on IV heparin with bridge to warfarin.     He comes in for followup. He is now s/p CRT-D/AVJ and feels well. Mild dyspnea, but controlled. Weight gain likely to excess calories, not HF.  The patient otherwise denies chest pain, dyspnea, presyncope, syncope or lateralizing symptoms.    Father died from complications of COVID in 2020.     Cardiac PMH: (Old records have been reviewed and summarized below)    EKG:  (EKG has been independently visualized by me with interpretation below): Atrial fibrillation with mild tachycardia.     ECHO: 11/05/2017  - ??Left ventricle: The ventricle was dilated. Systolic function was markedly  reduced. Ejection fraction was estimated in the range of 15 % to 20 %.    - ??Right ventricle: Systolic function was reduced.    - ??Left atrium: The atrium was dilated.  - ??Left atrial appendage: There was continuous spontaneous echo contrast  ("smoke") in the appendage. There was thrombus noted in the LAA. Due to  thrombus cardioversion was not performed.    - ??  Atrial septum: Appeared to be a PFO by color doppler.    - ??Right atrium: The atrium was dilated.    - ??Mitral valve: There was mild regurgitation.    - ??Tricuspid valve: There was mild regurgitation.    - ??Aorta, systemic arteries: There was mild atheroma.    Previous Heart Catheterization: 09/2010  CORONARY ARTERIOGRAMS  1. Both the right and left coronaries were large and patulous  proximally.  2. The left main coronary was quite large, was huge, and then tapered to  the LAD, circumflex systems.   3. The LAD coronary had evidence of proximal stenting which emanated from  the left main. The stented segment appeared widely patent with a good  inflow and outflow and no significant disruption. No luminal narrowing  and TIMI-3 flow in the distal LAD. There were no significant mid LAD  stenoses.  4. The circumflex coronary artery provided a ramus intermedius and a  moderate mid vessel marginal. The ramus intermedius was tortuous but  appeared free of significant disease. There was a high diagonal as well  which was tortuous and paralleled the ramus. It was small in caliber and  extent, was diffusely irregular but no high-grade stenosis.  5. The circumflex coronary artery contained minor irregularity proximally  and then had a 40-50% stenosis. It was ectatic before it gave rise to the  mid vessel marginal. The mid vessel marginal had 50% stenosis in its  origin. The circumflex after the marginal origin contained an 80%  stenosis but was very limited in the distal circulation. On review of the  left system anatomy. There was no significant change from that previously  in October 2011.  6. The right coronary artery was also patulous and irregular, large  caliber trunk, tortuous with a mid vessel 40% stenosis, with a small  button of ulceration. Then was diffusely irregular throughout its distal  half. There was evidence of previous stenting from the distal right into  the PDA. The stented segment was widely patent. There was good luminal  appearance inflow and outflow to a large PDA. There was a subsegmental  PDA which was patent and had minor irregularity throughout. This was much  smaller in caliber than the primary PDA.  7. 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  ??  IMPRESSION  1. Stable pattern of diffuse coronary disease as described.   2. Mild reduction in left ventricular systolic function with an apical  wall motion abnormality.    Stress Test: n/a     DEVICE INTERROGATION: BSC, implanted 06/2019. Stable lead parameters, BIV paced 100%.     Past Medical History, Past Surgical History, Family history, Social History, and Medications were all reviewed with the patient today and updated as necessary.     Current Outpatient Medications   Medication Sig Dispense Refill   ??? losartan (COZAAR) 25 mg tablet Take 1 Tab by mouth daily. 90 Tab 3   ??? metoprolol succinate (TOPROL-XL) 100 mg tablet Take 1 Tab by mouth two (2) times a day. 180 Tab 3   ??? clopidogreL (PLAVIX) 75 mg tab Take 1 Tab by mouth daily. 90 Tab 3   ??? apixaban (ELIQUIS) 5 mg tablet Take 1 Tab by mouth two (2) times a day. 60 Tab 11   ??? furosemide (LASIX) 40 mg tablet Take 1 Tab by mouth daily as needed (PRN). 90 Tab 3   ???  potassium chloride (KLOR-CON) 10 mEq tablet Take 1 Tab by mouth two (2) times daily as needed (takes with lasix). 90 Tab 3   ??? OTHER antronex daily as needed for allergies     ??? nitroglycerin (NITROSTAT) 0.4 mg SL tablet 1 Tab by SubLINGual route as needed for Chest Pain. Up to 3 doses. 1 Bottle 5     Allergies   Allergen Reactions   ??? Aspirin Hives   ??? Gluten Other (comments)     Abdominal pain and hip pain   ??? Tylenol [Acetaminophen] Hives     Patient Active Problem List    Diagnosis   ??? Status post biventricular pacemaker   ??? Anterior myocardial infarction [facility])   ??? Essential hypertension, benign   ??? Coronary atherosclerosis of native coronary artery   ??? Dyslipidemia   ??? Atrial fibrillation (HCC)   ??? Dilated cardiomyopathy (HCC)   ??? Atrial fibrillation with RVR (HCC)   ??? Thrombus of left atrial appendage   ??? Chest pain, unspecified   ??? Unstable angina (HCC)   ??? Hypertension   ??? CAD (coronary artery disease)       Past Medical History:   Diagnosis Date   ??? Atrial fibrillation with RVR (HCC) 11/05/2017   ??? CAD (coronary artery disease)     ??? CVA (cerebral vascular accident) (HCC) 09/05/2017   ??? Dyslipidemia    ??? Hypertension    ??? Unstable angina (HCC) 02/02/2010     Past Surgical History:   Procedure Laterality Date   ??? PR CARDIAC SURG PROCEDURE UNLIST  11/2009    stent x1 LAD    ??? PR LEFT HEART CATH,PERCUTANEOUS  02/02/2010    stent x1     No family history on file.  Social History     Tobacco Use   ??? Smoking status: Never Smoker   ??? Smokeless tobacco: Never Used   Substance Use Topics   ??? Alcohol use: No       ROS:  A comprehensive review of systems was performed with the pertinent positives and negatives as noted in the HPI in addition to:    Review of Systems   Constitutional: Negative.    HENT: Negative.    Eyes: Negative.    Respiratory: Negative.    Cardiovascular: Negative.    Gastrointestinal: Negative.    Genitourinary: Negative.    Musculoskeletal: Negative.    Skin: Negative.    Neurological: Negative.    Endo/Heme/Allergies: Negative.    Psychiatric/Behavioral: Negative.      PHYSICAL EXAM:     Visit Vitals  BP (!) 146/88   Pulse 71   Ht 6\' 4"  (1.93 m)   Wt 283 lb (128.4 kg)   BMI 34.45 kg/m??        Wt Readings from Last 3 Encounters:   05/27/19 283 lb (128.4 kg)   07/02/18 260 lb 8 oz (118.2 kg)   06/12/18 255 lb (115.7 kg)     BP Readings from Last 3 Encounters:   05/27/19 (!) 146/88   07/02/18 131/88   07/01/18 (!) 115/97       Gen: Well appearing, well developed, no acute distress  Eyes: Pupils equal, round. Extraocular movements are intact  ENT: Oropharynx clear, no oral lesions, normal dentition  CV: S1S2, IRRR, tachy, no murmurs, rubs or gallops, normal JVD, no carotid bruits, normal distal pulses, no LEE, left sided CIED C/D/I.   Pulm: Clear to auscultation bilaterally, no accessory muscle uses, no wheezes or rales  GI: Soft, NT, ND, +BS  Neuro: Alert and oriented, nonfocal  Psych: Appropriate affect  Skin: Normal color and skin turgor  MSK: Normal muscle bulk and tone     Medical problems and test results were reviewed with the patient today.     No results found for any visits on 05/27/19.    Lab Results   Component Value Date/Time    Potassium 4.4 06/28/2018 11:10 AM     Lab Results   Component Value Date/Time    Creatinine 1.00 06/28/2018 11:10 AM     Lab Results   Component Value Date/Time    HGB 15.6 06/28/2018 11:10 AM     Lab Results   Component Value Date/Time    INR 1.0 06/28/2018 11:10 AM    INR 1.0 06/04/2018 06:35 AM    INR 1.1 12/18/2017 09:19 AM    Prothrombin time 13.2 06/28/2018 11:10 AM    Prothrombin time 13.3 06/04/2018 06:35 AM    Prothrombin time 14.6 (H) 12/18/2017 09:19 AM

## 2019-05-28 ENCOUNTER — Other Ambulatory Visit: Payer: Self-pay | Admitting: Family Medicine

## 2019-05-28 DIAGNOSIS — M1990 Unspecified osteoarthritis, unspecified site: Secondary | ICD-10-CM

## 2019-05-28 NOTE — Telephone Encounter (Signed)
Requested Prescriptions  Pending Prescriptions Disp Refills  . meloxicam (MOBIC) 15 MG tablet [Pharmacy Med Name: MELOXICAM 15 MG TABLET] 30 tablet 2    Sig: TAKE 1 TABLET BY MOUTH UP TO ONCE A DAY AS NEEDED FOR PAIN     Analgesics:  COX2 Inhibitors Passed - 05/28/2019  2:00 AM      Passed - HGB in normal range and within 360 days    Hemoglobin  Date Value Ref Range Status  01/31/2019 17.1 13.0 - 17.7 g/dL Final         Passed - Cr in normal range and within 360 days    Creatinine, Ser  Date Value Ref Range Status  01/31/2019 1.22 0.76 - 1.27 mg/dL Final         Passed - Patient is not pregnant      Passed - Valid encounter within last 12 months    Recent Outpatient Visits          2 months ago Encounter for annual physical exam   [facility] Jerrol Banana., MD   3 months ago Chronic bronchitis, unspecified chronic bronchitis type [facility])   [facility] Jerrol Banana., MD   2 years ago [place], [place]   2 years ago Acute sinusitis, recurrence not specified, unspecified location   [facility] Jerrol Banana., MD   3 years ago Annual physical exam   [facility] Jerrol Banana., MD      Future Appointments            In 3 months Jerrol Banana., MD [facility], [place]

## 2019-06-20 ENCOUNTER — Other Ambulatory Visit: Payer: Self-pay | Admitting: Internal Medicine

## 2019-06-20 DIAGNOSIS — R05 Cough: Secondary | ICD-10-CM

## 2019-06-20 DIAGNOSIS — R059 Cough, unspecified: Secondary | ICD-10-CM

## 2019-07-07 ENCOUNTER — Other Ambulatory Visit: Payer: Self-pay

## 2019-07-07 ENCOUNTER — Ambulatory Visit (INDEPENDENT_AMBULATORY_CARE_PROVIDER_SITE_OTHER): Payer: Managed Care, Other (non HMO) | Admitting: Dermatology

## 2019-07-07 DIAGNOSIS — D225 Melanocytic nevi of trunk: Secondary | ICD-10-CM | POA: Diagnosis not present

## 2019-07-07 DIAGNOSIS — L578 Other skin changes due to chronic exposure to nonionizing radiation: Secondary | ICD-10-CM

## 2019-07-07 DIAGNOSIS — L821 Other seborrheic keratosis: Secondary | ICD-10-CM

## 2019-07-07 DIAGNOSIS — D224 Melanocytic nevi of scalp and neck: Secondary | ICD-10-CM | POA: Diagnosis not present

## 2019-07-07 DIAGNOSIS — L570 Actinic keratosis: Secondary | ICD-10-CM

## 2019-07-07 DIAGNOSIS — D229 Melanocytic nevi, unspecified: Secondary | ICD-10-CM

## 2019-07-07 DIAGNOSIS — D239 Other benign neoplasm of skin, unspecified: Secondary | ICD-10-CM

## 2019-07-07 NOTE — Progress Notes (Signed)
   Follow-Up Visit   Subjective  Stephen Sosa. is a 64 y.o. 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    The following portions of the chart were reviewed this encounter and updated as appropriate:     Review of Systems: No other skin or systemic complaints.  Objective  Well appearing patient in no apparent distress; mood and affect are within normal limits.  A focused examination was performed including face, scalp, back, arms. Relevant physical exam findings are noted in the Assessment and Plan.  Objective  Left Zygomatic Area: Erythematous thin papules/macules with gritty scale.    Objective  Generalized: Actinic changes   Objective  Right Upper Back: Healing pink biopsy site.Biopsy proven moderate to severe dysplastic nevus treated today with shave removal.    Objective  mid vertex scalp: Flesh colored papule.  Objective  Face: Stuck-on, waxy, tan-brown papule or plaque --Discussed benign etiology and prognosis.   Assessment & Plan  AK (actinic keratosis) Left Zygomatic Area  Destruction of lesion - Left Zygomatic Area Complexity: simple   Destruction method: cryotherapy   Informed consent: discussed and consent obtained   Timeout:  patient name, date of birth, surgical site, and procedure verified Lesion destroyed using liquid nitrogen: Yes   Region frozen until ice ball extended beyond lesion: Yes   Outcome: patient tolerated procedure well with no complications   Post-procedure details: wound care instructions given    Actinic skin damage Generalized  Dysplastic nevus Right Upper Back  Epidermal / dermal shaving - Right Upper Back  Lesion length (cm):  0.5 Lesion width (cm):  0.5 Margin per side (cm):  0.3 Total excision diameter (cm):  1.1 Informed  consent: discussed and consent obtained   Timeout: patient name, date of birth, surgical site, and procedure verified   Procedure prep:  Patient was prepped and draped in usual sterile fashion Prep type:  Isopropyl alcohol Anesthesia: the lesion was anesthetized in a standard fashion   Anesthetic:  1% lidocaine w/ epinephrine 1-100,000 nerve block Instrument used: flexible razor blade   Hemostasis achieved with: pressure, aluminum chloride and electrodesiccation   Outcome: patient tolerated procedure well   Post-procedure details: sterile dressing applied and wound care instructions given   Dressing type: bandage and petrolatum   Additional details:  Biopsy proven moderate to severe dysplastic nevus. Plan shave removal today for treatment.  Specimen 1 - Surgical pathology Differential Diagnosis: Biopsy proven moderate - severe dysplastic nevus Check Margins: yes [id number]  Nevus mid vertex scalp  Benign-appearing.  Observation   Seborrheic keratosis Face  Observe   Return in about 6 months (around 01/07/2020).   I, Ashok Cordia, CMA, am acting as scribe for Sarina Ser, MD .

## 2019-07-07 NOTE — Patient Instructions (Addendum)

## 2019-07-10 ENCOUNTER — Telehealth: Payer: Self-pay

## 2019-07-10 NOTE — Telephone Encounter (Signed)
Unable to leave a message in regards to pathology results VM box full.

## 2019-07-11 NOTE — Telephone Encounter (Signed)
Patient informed of pathology results 

## 2019-07-13 ENCOUNTER — Other Ambulatory Visit: Payer: Self-pay | Admitting: Internal Medicine

## 2019-07-13 DIAGNOSIS — R05 Cough: Secondary | ICD-10-CM

## 2019-07-13 DIAGNOSIS — R059 Cough, unspecified: Secondary | ICD-10-CM

## 2019-07-29 ENCOUNTER — Encounter

## 2019-07-29 ENCOUNTER — Institutional Professional Consult (permissible substitution): Admit: 2019-07-29 | Discharge: 2019-07-29 | Primary: Family Medicine

## 2019-07-29 DIAGNOSIS — I5032 Chronic diastolic (congestive) heart failure: Secondary | ICD-10-CM

## 2019-07-29 NOTE — Progress Notes (Signed)
This note will not be viewable in MyChart.    SCHEDULED REMOTE CHECK     Preliminary Impression: Requires physician review due to 2 non-sustained events, lasting 17 beats, no therapies delivered. 100% AT/AF burden, on AC. BiV pacing 100%. All normal function.    Following/Billing MD: Dr. Devona Konig  Implanting/Servicing MD: Dr. Freddrick March was scheduled for a remote device check today. The device report was generated from the remote website, and the results were forwarded to the ordering provider for review. Please see Interrogation report for the final results.     [name] [name]  07/29/2019  9:18 AM

## 2019-07-29 NOTE — Progress Notes (Signed)
This note will not be viewable in MyChart.    SCHEDULED REMOTE CHECK     Preliminary Impression: Requires physician review due to 2 non-sustained events, lasting 17 beats, no therapies delivered. 100% AT/AF burden, on AC. BiV pacing 100%. All normal function.    Following/Billing MD: Dr. Senfield  Implanting/Servicing MD: Dr. Senfield    [name] [name] was scheduled for a remote device check today. The device report was generated from the remote website, and the results were forwarded to the ordering provider for review. Please see Interrogation report for the final results.     Beth Wilson  07/29/2019  9:18 AM

## 2019-08-15 ENCOUNTER — Other Ambulatory Visit: Payer: Self-pay | Admitting: Internal Medicine

## 2019-08-15 DIAGNOSIS — R05 Cough: Secondary | ICD-10-CM

## 2019-08-15 DIAGNOSIS — R059 Cough, unspecified: Secondary | ICD-10-CM

## 2019-08-19 ENCOUNTER — Other Ambulatory Visit: Payer: Self-pay | Admitting: Internal Medicine

## 2019-08-19 DIAGNOSIS — R059 Cough, unspecified: Secondary | ICD-10-CM

## 2019-08-19 DIAGNOSIS — R05 Cough: Secondary | ICD-10-CM

## 2019-08-26 ENCOUNTER — Encounter: Primary: Family Medicine

## 2019-09-22 ENCOUNTER — Ambulatory Visit: Payer: Self-pay | Admitting: Family Medicine

## 2019-10-29 ENCOUNTER — Encounter: Primary: Family Medicine

## 2019-11-05 ENCOUNTER — Encounter: Payer: Managed Care, Other (non HMO) | Admitting: Dermatology

## 2019-11-13 ENCOUNTER — Encounter

## 2019-11-13 ENCOUNTER — Institutional Professional Consult (permissible substitution): Admit: 2019-11-13 | Primary: Family Medicine

## 2019-11-13 DIAGNOSIS — I5032 Chronic diastolic (congestive) heart failure: Secondary | ICD-10-CM

## 2019-11-13 NOTE — Progress Notes (Signed)
This note will not be viewable in MyChart.    IN OFFICE CHECK    Preliminary Impression: All normal function with minor programming changes made today to improve the device's function.    Following/Billing MD: [name]. Devona Konig, M.D.    Implanting/Servicing MD: Malka So, M.D.    [name] [name] presented to the Device Clinic today for a routine follow up. BIV 100%, Underlying paced. Leads stable. The device noted 4 nst episodes. Patient denies complaints. Rate response ws increased due to blunted rates. Normal CRT-D function. The device was interrogated, and the results were forwarded to the ordering provider for review. Please see Interrogation report for the final results.     [name], RN  11/13/2019  2:52 PM

## 2020-01-01 ENCOUNTER — Ambulatory Visit: Payer: Managed Care, Other (non HMO) | Admitting: Dermatology

## 2020-02-16 ENCOUNTER — Encounter

## 2020-02-16 ENCOUNTER — Institutional Professional Consult (permissible substitution): Admit: 2020-02-17 | Discharge: 2020-02-17 | Primary: Family Medicine

## 2020-02-16 DIAGNOSIS — I420 Dilated cardiomyopathy: Secondary | ICD-10-CM

## 2020-02-16 NOTE — Progress Notes (Signed)
This note will not be viewable in MyChart.    IN OFFICE CHECK    Preliminary Impression: Requires physician review due to 100% AF burden, on AC. BiV pacing 100%.    Following/Billing MD: [name]. Devona Konig, M.D.    Implanting/Servicing MD: Malka So, M.D.    [name] [name] presented to the Device Clinic today for a routine follow up. No ventricular events. Chronic AF, on AC. Normal CRT-D function. The device was interrogated, and the results were forwarded to the ordering provider for review. Please see Interrogation report for the final results.     [name] [name] July  02/17/2020  9:54 AM

## 2020-03-10 ENCOUNTER — Telehealth: Payer: Self-pay | Admitting: *Deleted

## 2020-03-10 NOTE — Telephone Encounter (Signed)
Patient states he has just got + COVID test [facility])- patient reports he has cough, green phlegm. Advised per COVID+ protocol:   Patient test due to symptoms: cough, fatigue, sore throat. Patient advised to treat symptoms as needed OTC, contact PCP for follow up and go to ED for trouble breathing ,dehydration or severe weakness. Advised to isolate and safe precautions in the home reviewed. CDC criteria for ending isolation given. Health dept notified.  Patient reports he is concerned about coughing up green phlegm and his history of bronchitis. Advised patient virtual visit on line/UC- he will do that. Will forward message to PCP so he will be aware.

## 2020-03-16 ENCOUNTER — Telehealth (HOSPITAL_COMMUNITY): Payer: Self-pay | Admitting: Emergency Medicine

## 2020-03-16 ENCOUNTER — Telehealth: Payer: Self-pay | Admitting: Nurse Practitioner

## 2020-03-16 DIAGNOSIS — U071 COVID-19: Secondary | ICD-10-CM

## 2020-03-16 NOTE — Telephone Encounter (Signed)
Called pt and explained possible monoclonal antibody treatment. Sx started 11/20. Tested positive 11/22 at [facility] in [place]. Sx include "it kicked off my bronchitis with mucus I can't get up. I'm fine breathing. I'm taking my oxygen with a oxygen meter every hour and it's usually 95% or better. Qualifying risk factors include chronic bronchitis and HTN. Pt fully vaccinated with [facility] Feb or March 2021. Pt possibly interested in tx. Informed pt an APP will call back to possibly schedule an appointment. Pt thinks he is on the mend and unsure about driving to [place] today.

## 2020-03-16 NOTE — Telephone Encounter (Signed)
Called to discuss with Stephen Sosa. about Covid symptoms and the use of a monoclonal antibody infusion for those with mild to moderate Covid symptoms and at a high risk of hospitalization.    Pt does not qualify for infusion therapy given symptoms first presented > 10 days prior to timing of infusion. Symptoms tier reviewed as well as criteria for ending isolation. Preventative practices reviewed. Patient verbalized understanding   Patient Active Problem List   Diagnosis Date Noted  . 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 Arthritis 12/24/2014     Beckey Rutter, NP [phone] [email or web address]

## 2020-04-21 ENCOUNTER — Ambulatory Visit (INDEPENDENT_AMBULATORY_CARE_PROVIDER_SITE_OTHER): Payer: Managed Care, Other (non HMO) | Admitting: Dermatology

## 2020-04-21 ENCOUNTER — Encounter: Payer: Self-pay | Admitting: Dermatology

## 2020-04-21 ENCOUNTER — Other Ambulatory Visit: Payer: Self-pay

## 2020-04-21 DIAGNOSIS — Z1283 Encounter for screening for malignant neoplasm of skin: Secondary | ICD-10-CM | POA: Diagnosis not present

## 2020-04-21 DIAGNOSIS — D1800 Hemangioma unspecified site: Secondary | ICD-10-CM

## 2020-04-21 DIAGNOSIS — D229 Melanocytic nevi, unspecified: Secondary | ICD-10-CM

## 2020-04-21 DIAGNOSIS — L578 Other skin changes due to chronic exposure to nonionizing radiation: Secondary | ICD-10-CM

## 2020-04-21 DIAGNOSIS — I8312 Varicose veins of left lower extremity with inflammation: Secondary | ICD-10-CM

## 2020-04-21 DIAGNOSIS — L570 Actinic keratosis: Secondary | ICD-10-CM | POA: Diagnosis not present

## 2020-04-21 DIAGNOSIS — L249 Irritant contact dermatitis, unspecified cause: Secondary | ICD-10-CM

## 2020-04-21 DIAGNOSIS — L821 Other seborrheic keratosis: Secondary | ICD-10-CM

## 2020-04-21 DIAGNOSIS — Z86018 Personal history of other benign neoplasm: Secondary | ICD-10-CM

## 2020-04-21 DIAGNOSIS — L814 Other melanin hyperpigmentation: Secondary | ICD-10-CM

## 2020-04-21 DIAGNOSIS — D692 Other nonthrombocytopenic purpura: Secondary | ICD-10-CM

## 2020-04-21 NOTE — Patient Instructions (Addendum)
Cryotherapy Aftercare  . Wash gently with soap and water everyday.   Marland Kitchen Apply Vaseline and Band-Aid daily until healed.   Over the counter Dermend for bruise discussed

## 2020-04-21 NOTE — Progress Notes (Signed)
Follow-Up Visit   Subjective  Stephen Sosa. is a 65 y.o. male who presents for the following: Annual Exam (Mole check, full body exam, hx of Dysplastic nevus). Pt c/o irritation at lower legs calves from pants rubbing, pt treating with otc Cerave cream with a fair response.  The patient presents for Total-Body Skin Exam (TBSE) for skin cancer screening and mole check.  The following portions of the chart were reviewed this encounter and updated as appropriate:   Tobacco  Allergies  Meds  Problems  Med Hx  Surg Hx  Fam Hx     Review of Systems:  No other skin or systemic complaints except as noted in HPI or Assessment and Plan.  Objective  Well appearing patient in no apparent distress; mood and affect are within normal limits.  A full examination was performed including scalp, head, eyes, ears, nose, lips, neck, chest, axillae, abdomen, back, buttocks, bilateral upper extremities, bilateral lower extremities, hands, feet, fingers, toes, fingernails, and toenails. All findings within normal limits unless otherwise noted below.  Objective  R ear helix, L tragus, L preauricular (3): Erythematous thin papules/macules with gritty scale.   Objective  Multiple see history: Scar with no evidence of recurrence.   Objective  lower legs at calves: Scaly erythematous papules and plaques +/- edema and vesiculation.    Assessment & Plan  AK (actinic keratosis) (3) R ear helix, L tragus, L preauricular  Destruction of lesion - R ear helix, L tragus, L preauricular Complexity: simple   Destruction method: cryotherapy   Informed consent: discussed and consent obtained   Timeout:  patient name, date of birth, surgical site, and procedure verified Lesion destroyed using liquid nitrogen: Yes   Region frozen until ice ball extended beyond lesion: Yes   Outcome: patient tolerated procedure well with no complications   Post-procedure details: wound care instructions given    History  of dysplastic nevus Multiple see history  Clear. Observe for recurrence. Call clinic for new or changing lesions.  Recommend regular skin exams, daily broad-spectrum spf 30+ sunscreen use, and photoprotection.     Irritant contact dermatitis lower legs at calves Pt treating with otc Cerave cream, pt decline prescription treatment today, he will call if he would like to add Mometasone cream   Skin cancer screening   Lentigines - Scattered tan macules - Discussed due to sun exposure - Benign, observe - Call for any changes  Seborrheic Keratoses - Stuck-on, waxy, tan-brown papules and plaques  - Discussed benign etiology and prognosis. - Observe - Call for any changes  Melanocytic Nevi - Tan-brown and/or pink-flesh-colored symmetric macules and papules - Benign appearing on exam today - Observation - Call clinic for new or changing moles - Recommend daily use of broad spectrum spf 30+ sunscreen to sun-exposed areas.   Hemangiomas - Red papules - Discussed benign nature - Observe - Call for any changes  Actinic Damage - Chronic, secondary to cumulative UV/sun exposure - diffuse scaly erythematous macules with underlying dyspigmentation - Recommend daily broad spectrum sunscreen SPF 30+ to sun-exposed areas, reapply every 2 hours as needed.  - Call for new or changing lesions.  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  Treatable, but not curable. - Violaceous macules and patches - Benign - Related to trauma, age, sun  damage and/or use of blood thinners, chronic use of topical and/or oral steroids - Observe - Can use OTC arnica containing moisturizer such as Dermend Bruise Formula if desired - Call for worsening or other concerns  Skin cancer screening performed today.  Return in about 6 months (around 10/19/2020) for UBSE Hx of Dysplastic neuvs .  I, Marye Round, CMA, am acting as scribe for Sarina Ser, MD .  Documentation: I have reviewed the above documentation for accuracy and completeness, and I agree with the above.  Sarina Ser, MD

## 2020-04-26 ENCOUNTER — Ambulatory Visit (INDEPENDENT_AMBULATORY_CARE_PROVIDER_SITE_OTHER): Payer: Managed Care, Other (non HMO) | Admitting: Internal Medicine

## 2020-04-26 ENCOUNTER — Encounter: Payer: Self-pay | Admitting: Internal Medicine

## 2020-04-26 ENCOUNTER — Other Ambulatory Visit: Payer: Self-pay

## 2020-04-26 VITALS — BP 118/76 | HR 65 | Temp 98.6°F | Ht 69.0 in | Wt 209.0 lb

## 2020-04-26 DIAGNOSIS — M8949 Other hypertrophic osteoarthropathy, multiple sites: Secondary | ICD-10-CM

## 2020-04-26 DIAGNOSIS — Z125 Encounter for screening for malignant neoplasm of prostate: Secondary | ICD-10-CM

## 2020-04-26 DIAGNOSIS — K219 Gastro-esophageal reflux disease without esophagitis: Secondary | ICD-10-CM | POA: Diagnosis not present

## 2020-04-26 DIAGNOSIS — Z0001 Encounter for general adult medical examination with abnormal findings: Secondary | ICD-10-CM

## 2020-04-26 DIAGNOSIS — Z1211 Encounter for screening for malignant neoplasm of colon: Secondary | ICD-10-CM

## 2020-04-26 DIAGNOSIS — Z0000 Encounter for general adult medical examination without abnormal findings: Secondary | ICD-10-CM | POA: Diagnosis not present

## 2020-04-26 DIAGNOSIS — M150 Primary generalized (osteo)arthritis: Secondary | ICD-10-CM | POA: Insufficient documentation

## 2020-04-26 DIAGNOSIS — M159 Polyosteoarthritis, unspecified: Secondary | ICD-10-CM

## 2020-04-26 NOTE — Progress Notes (Signed)
Subjective:  Patient ID: Stephen Sosa., male    DOB: Jul 19, 1955  Age: 65 y.o. MRN: [id number]  CC: Osteoarthritis, Annual Exam, and Gastroesophageal Reflux  This visit occurred during the SARS-CoV-2 public health emergency.  Safety protocols were in place, including screening questions prior to the visit, additional usage of staff PPE, and extensive cleaning of exam room while observing appropriate contact time as indicated for disinfecting solutions.    HPI [name]. presents for a CPX and to establish.  He has a history of asthmatic bronchitis and osteoarthritis.  His symptoms are well controlled with Symbicort and meloxicam.  He is very active and denies any recent episodes of chest pain, shortness of breath, dyspnea on exertion, palpitations, edema, or fatigue.  History [name] has a past medical history of Actinic keratosis, Arthritis, Dysplastic nevus (06/23/2008), Dysplastic nevus (05/07/2019), Dysplastic nevus (07/07/2019), Headache, Joint pain, Joint swelling, Macular degeneration, and Phlebitis.   He has a past surgical history that includes Varicose vein surgery (Bilateral, 2003); Knee arthroscopy (Left); Joint replacement (Right, 2005); Hernia repair (2000); and Total knee arthroplasty (Left, 01/25/2015).   His family history includes Dementia in his father.He reports that he has never smoked. He has never used smokeless tobacco. He reports current alcohol use. He reports that he does not use drugs.  Outpatient Medications Prior to Visit  Medication Sig Dispense Refill   budesonide-formoterol (SYMBICORT) 160-4.5 MCG/ACT inhaler Inhale 1 puff into the lungs 2 (two) times daily. 1 Inhaler 12   meloxicam (MOBIC) 15 MG tablet TAKE 1 TABLET BY MOUTH UP TO ONCE A DAY AS NEEDED FOR PAIN 30 tablet 2   pantoprazole (PROTONIX) 40 MG tablet TAKE 1 TABLET BY MOUTH EVERY DAY 90 tablet 1   budesonide-formoterol (SYMBICORT) 160-4.5 MCG/ACT inhaler Inhale 2 puffs into the lungs 2  (two) times daily.     budesonide (RHINOCORT AQUA) 32 MCG/ACT nasal spray Place 2 sprays into both nostrils daily. 1 g 1   No facility-administered medications prior to visit.    ROS Review of Systems  Constitutional: Negative.  Negative for chills, diaphoresis and fatigue.  HENT: Negative.   Eyes: Negative for visual disturbance.  Respiratory: Negative for cough, chest tightness, shortness of breath and wheezing.   Cardiovascular: Negative for chest pain, palpitations and leg swelling.  Gastrointestinal: Negative for abdominal pain, constipation, diarrhea, nausea and vomiting.  Endocrine: Negative.   Genitourinary: Negative.  Negative for difficulty urinating, dysuria, scrotal swelling and testicular pain.  Musculoskeletal: Positive for arthralgias. Negative for back pain and neck pain.  Skin: Negative.   Neurological: Negative.  Negative for dizziness, weakness, light-headedness and headaches.  Hematological: Negative for adenopathy. Does not bruise/bleed easily.  Psychiatric/Behavioral: Negative.     Objective:  BP 118/76    Pulse 65    Temp 98.6 F (37 C) (Oral)    Ht 5\' 9"  (1.753 m)    Wt 209 lb (94.8 kg)    SpO2 97%    BMI 30.86 kg/m   Physical Exam Vitals reviewed.  Constitutional:      Appearance: Normal appearance.  HENT:     Nose: Nose normal.     Mouth/Throat:     Mouth: Mucous membranes are moist.  Eyes:     General: No scleral icterus.    Conjunctiva/sclera: Conjunctivae normal.  Cardiovascular:     Rate and Rhythm: Normal rate and regular rhythm.     Heart sounds: No murmur heard.   Pulmonary:     Effort: Pulmonary  effort is normal.     Breath sounds: No stridor. No wheezing, rhonchi or rales.  Abdominal:     General: Abdomen is flat. Bowel sounds are normal. There is no distension.     Palpations: Abdomen is soft. There is no hepatomegaly, splenomegaly or mass.     Tenderness: There is no abdominal tenderness.     Hernia: There is no hernia in the  left inguinal area or right inguinal area.  Genitourinary:    Pubic Area: No rash.      Penis: Normal and circumcised.      Testes: Normal.     Epididymis:     Right: Normal. Not inflamed or enlarged.     Left: Normal. Not inflamed or enlarged.     Prostate: Normal. Not enlarged, not tender and no nodules present.     Rectum: Normal. Guaiac result negative. No mass, tenderness, anal fissure, external hemorrhoid or internal hemorrhoid. Normal anal tone.  Musculoskeletal:        General: Normal range of motion.     Cervical back: Neck supple.     Right lower leg: No edema.     Left lower leg: No edema.  Lymphadenopathy:     Cervical: No cervical adenopathy.     Lower Body: No right inguinal adenopathy. No left inguinal adenopathy.  Skin:    General: Skin is warm and dry.     Findings: No rash.  Neurological:     General: No focal deficit present.     Mental Status: He is alert and oriented to person, place, and time. Mental status is at baseline.     Lab Results  Component Value Date   WBC 7.2 04/26/2020   HGB 16.3 04/26/2020   HCT 47.9 04/26/2020   PLT 318.0 04/26/2020   GLUCOSE 98 04/26/2020   CHOL 187 04/26/2020   TRIG 64.0 04/26/2020   HDL 77.70 04/26/2020   LDLCALC 97 04/26/2020   ALT 22 04/26/2020   AST 18 04/26/2020   NA 139 04/26/2020   K 3.9 04/26/2020   CL 101 04/26/2020   CREATININE 1.28 04/26/2020   BUN 18 04/26/2020   CO2 30 04/26/2020   TSH 1.530 01/31/2019   PSA 1.14 04/26/2020   INR 1.06 01/14/2015   HGBA1C 5.5 12/24/2014     Assessment & Plan:   Adnan was seen today for osteoarthritis, annual exam and gastroesophageal reflux.  Diagnoses and all orders for this visit:  Gastroesophageal reflux disease without esophagitis- His symptoms are well controlled with the H2 blocker.  Will continue. -     CBC with Differential/Platelet; Future -     Basic metabolic panel; Future -     Hepatic function panel; Future -     Hepatic function panel -      Basic metabolic panel -     CBC with Differential/Platelet  Primary osteoarthritis involving multiple joints- He gets adequate single symptom relief with the occasional dose of meloxicam. -     CBC with Differential/Platelet; Future -     Basic metabolic panel; Future -     Hepatic function panel; Future -     Hepatic function panel -     Basic metabolic panel -     CBC with Differential/Platelet  Encounter for general adult medical examination with abnormal findings- Exam completed, labs reviewed - He has a low ASCVD risk score so I did not recommend a statin for CV risk reduction, cancer screenings addressed, vaccines reviewed and updated,  patient education was given. -     Lipid panel; Future -     PSA; Future -     HIV Antibody (routine testing w rflx); Future -     Hepatitis C antibody; Future -     Hepatitis C antibody -     HIV Antibody (routine testing w rflx) -     PSA -     Lipid panel  Screen for colon cancer  Colon cancer screening -     Cologuard   I am having Gemma Payor. maintain his budesonide, budesonide-formoterol, meloxicam, and pantoprazole.  No orders of the defined types were placed in this encounter.    Follow-up: Return in about 1 year (around 04/26/2021).  Sanda Linger, MD

## 2020-04-26 NOTE — Patient Instructions (Signed)

## 2020-04-27 LAB — BASIC METABOLIC PANEL
BUN: 18 mg/dL (ref 6–23)
CO2: 30 mEq/L (ref 19–32)
Calcium: 10 mg/dL (ref 8.4–10.5)
Chloride: 101 mEq/L (ref 96–112)
Creatinine, Ser: 1.28 mg/dL (ref 0.40–1.50)
GFR: 59.47 mL/min — ABNORMAL LOW (ref >60.00)
Glucose, Bld: 98 mg/dL (ref 70–99)
Potassium: 3.9 mEq/L (ref 3.5–5.1)
Sodium: 139 mEq/L (ref 135–145)

## 2020-04-27 LAB — CBC WITH DIFFERENTIAL/PLATELET
Basophils Absolute: 0.1 10*3/uL (ref 0.0–0.1)
Basophils Relative: 1.3 % (ref 0.0–3.0)
Eosinophils Absolute: 0.2 10*3/uL (ref 0.0–0.7)
Eosinophils Relative: 2.8 % (ref 0.0–5.0)
HCT: 47.9 % (ref 39.0–52.0)
Hemoglobin: 16.3 g/dL (ref 13.0–17.0)
Lymphocytes Relative: 24.8 % (ref 12.0–46.0)
Lymphs Abs: 1.8 10*3/uL (ref 0.7–4.0)
MCHC: 34 g/dL (ref 30.0–36.0)
MCV: 91.1 fl (ref 78.0–100.0)
Monocytes Absolute: 0.4 10*3/uL (ref 0.1–1.0)
Monocytes Relative: 6.2 % (ref 3.0–12.0)
Neutro Abs: 4.7 10*3/uL (ref 1.4–7.7)
Neutrophils Relative %: 64.9 % (ref 43.0–77.0)
Platelets: 318 10*3/uL (ref 150.0–400.0)
RBC: 5.26 Mil/uL (ref 4.22–5.81)
RDW: 14.2 % (ref 11.5–15.5)
WBC: 7.2 10*3/uL (ref 4.0–10.5)

## 2020-04-27 LAB — LIPID PANEL
Cholesterol: 187 mg/dL (ref 0–200)
HDL: 77.7 mg/dL (ref >39.00)
LDL Cholesterol: 97 mg/dL (ref 0–99)
NonHDL: 109.7
Total CHOL/HDL Ratio: 2
Triglycerides: 64 mg/dL (ref 0.0–149.0)
VLDL: 12.8 mg/dL (ref 0.0–40.0)

## 2020-04-27 LAB — HEPATIC FUNCTION PANEL
ALT: 22 U/L (ref 0–53)
AST: 18 U/L (ref 0–37)
Albumin: 4.8 g/dL (ref 3.5–5.2)
Alkaline Phosphatase: 58 U/L (ref 39–117)
Bilirubin, Direct: 0.2 mg/dL (ref 0.0–0.3)
Total Bilirubin: 0.9 mg/dL (ref 0.2–1.2)
Total Protein: 7.5 g/dL (ref 6.0–8.3)

## 2020-04-27 LAB — HIV ANTIBODY (ROUTINE TESTING W REFLEX): HIV 1&2 Ab, 4th Generation: NONREACTIVE

## 2020-04-27 LAB — PSA: PSA: 1.14 ng/mL (ref 0.10–4.00)

## 2020-04-27 LAB — HEPATITIS C ANTIBODY
Hepatitis C Ab: NONREACTIVE
SIGNAL TO CUT-OFF: 0.01 (ref <1.00)

## 2020-05-21 ENCOUNTER — Other Ambulatory Visit: Payer: Self-pay | Admitting: Dermatology

## 2020-05-25 ENCOUNTER — Encounter: Primary: Family Medicine

## 2020-05-31 ENCOUNTER — Encounter

## 2020-05-31 ENCOUNTER — Institutional Professional Consult (permissible substitution): Admit: 2020-05-31 | Discharge: 2020-05-31 | Primary: Family Medicine

## 2020-05-31 DIAGNOSIS — I420 Dilated cardiomyopathy: Secondary | ICD-10-CM

## 2020-05-31 NOTE — Progress Notes (Signed)
This note will not be viewable in MyChart.    SCHEDULED REMOTE CHECK     Preliminary Impression: Requires physician review due to brief non-sustained event. No therapies delivered. BiV pacing 99%.    Following MD: Dr. Devona Konig  Implanting MD: Dr. Freddrick March was scheduled for a remote device check today. 1 non-sustained event lasting 10 beats @178  bpm. No therapies delivered. 100% AF burden, on AC. Normal CRT-D function. The device report was generated from the remote website, and the results were forwarded to the ordering provider for review. Please see Interrogation report for the final results.      05/31/2020  12:32 PM

## 2020-08-30 ENCOUNTER — Encounter

## 2020-08-30 ENCOUNTER — Institutional Professional Consult (permissible substitution): Admit: 2020-08-30 | Discharge: 2020-08-30 | Primary: Family Medicine

## 2020-08-30 DIAGNOSIS — I420 Dilated cardiomyopathy: Secondary | ICD-10-CM

## 2020-08-30 NOTE — Progress Notes (Signed)
This note will not be viewable in MyChart.    SCHEDULED REMOTE CHECK     Preliminary Impression: Requires physician review due to ventricular events. BiV pacing 99%.    Following MD: Dr. Devona Konig  Implanting MD: Dr. Freddrick March was scheduled for a remote device check today. Permanent AF,on AC. Non-sustained events appear brief runs of NSVT. No therapies delivered. Normal CRT-D function. The device report was generated from the remote website, and the results were forwarded to the ordering provider for review. Please see Interrogation report for the final results.     [name] [name]  08/30/2020  9:10 AM

## 2020-09-09 MED ORDER — METOPROLOL SUCCINATE ER 100 MG PO TB24
100 MG | ORAL_TABLET | Freq: Every day | ORAL | 0 refills | Status: DC
Start: 2020-09-09 — End: 2020-12-06

## 2020-09-09 MED ORDER — APIXABAN 5 MG PO TABS
5 MG | ORAL_TABLET | Freq: Two times a day (BID) | ORAL | 0 refills | Status: DC
Start: 2020-09-09 — End: 2020-12-06

## 2020-09-09 MED ORDER — LOSARTAN POTASSIUM 25 MG PO TABS
25 MG | ORAL_TABLET | Freq: Every day | ORAL | 0 refills | Status: DC
Start: 2020-09-09 — End: 2020-12-06

## 2020-09-09 NOTE — Telephone Encounter (Signed)
MEDICATION REFILL REQUEST      Name of Medication: losartan  Dose:  25  Frequency:   Quantity:    Days' supply:90 day supply      [facility] [address]      Name of Medication:  metoprolol  Dose:  50  Frequency:   Quantity:   Days' supply:  90      [facility] [facility] [facility]      Name of Medication: Eliquis  Dose:    Frequency:    Quantity:   Days' supply:       Pharmacy Name/Location:  [facility]

## 2020-09-09 NOTE — Telephone Encounter (Signed)
Medications sent to pharmacy.

## 2020-09-15 NOTE — Telephone Encounter (Signed)
Eliquis sent to [facility] on [address]

## 2020-10-04 ENCOUNTER — Ambulatory Visit: Payer: Managed Care, Other (non HMO) | Admitting: Dermatology

## 2020-10-05 ENCOUNTER — Telehealth: Payer: Self-pay | Admitting: Internal Medicine

## 2020-10-05 NOTE — Telephone Encounter (Signed)
1.Medication Requested: meloxicam (MOBIC) 15 MG tablet  pantoprazole (PROTONIX) 40 MG tablet  2. Pharmacy (Name, Street, [place]): [facility], [place]  3. On Med List: yes   4. Last Visit with PCP: 04-26-20  5. Next visit date with PCP: n/a    Agent: Please be advised that RX refills may take up to 3 business days. We ask that you follow-up with your pharmacy.

## 2020-10-06 ENCOUNTER — Other Ambulatory Visit: Payer: Self-pay | Admitting: Internal Medicine

## 2020-10-06 DIAGNOSIS — M1990 Unspecified osteoarthritis, unspecified site: Secondary | ICD-10-CM

## 2020-10-06 DIAGNOSIS — K219 Gastro-esophageal reflux disease without esophagitis: Secondary | ICD-10-CM

## 2020-10-06 DIAGNOSIS — M159 Polyosteoarthritis, unspecified: Secondary | ICD-10-CM

## 2020-10-06 DIAGNOSIS — R059 Cough, unspecified: Secondary | ICD-10-CM

## 2020-10-06 MED ORDER — PANTOPRAZOLE SODIUM 40 MG PO TBEC: 1.0000 | DELAYED_RELEASE_TABLET | Freq: Every day | ORAL | 1 refills | Status: AC

## 2020-10-06 MED ORDER — MELOXICAM 15 MG PO TABS: 15.0000 mg | ORAL_TABLET | Freq: Every day | ORAL | 0 refills | Status: DC

## 2020-10-27 ENCOUNTER — Ambulatory Visit: Payer: Managed Care, Other (non HMO) | Admitting: Dermatology

## 2020-11-15 DIAGNOSIS — R0789 Other chest pain: Secondary | ICD-10-CM

## 2020-11-15 NOTE — ED Triage Notes (Signed)
Pt ambulatory to triage for reports of chest pain radiating to back since last night. Pt also endorsing swollen lymph nodes to neck & legs Pt with hx of pacemaker. Pt a&ox4.

## 2020-11-15 NOTE — ED Provider Notes (Signed)
[facility] Emergency Department Provider Note                     PCP:                NOT ON FILE               Age: 65 y.o.      Sex: male           ICD-10-CM    1. Atypical chest pain  R07.89           DISPOSITION Decision To Discharge 11/16/2020 02:20:23 AM       Discharge Medication List as of 11/16/2020  2:07 AM          MDM  Number of Diagnoses or Management Options  Atypical chest pain  Diagnosis management comments: Cardiac work-up is negative troponins are normal EKG is unchanged close follow-up is recommended.       Amount and/or Complexity of Data Reviewed  Clinical lab tests: ordered and reviewed  Tests in the radiology section of CPT??: ordered and reviewed  Tests in the medicine section of CPT??: ordered and reviewed  Decide to obtain previous medical records or to obtain history from someone other than the patient: yes  Review and summarize past medical records: yes  Independent visualization of images, tracings, or specimens: yes    Risk of Complications, Morbidity, and/or Mortality  Presenting problems: high  Diagnostic procedures: high  Management options: high    Patient Progress  Patient progress: stable      Orders Placed This Encounter   Procedures    XR CHEST (2 VW)    CBC    Comprehensive Metabolic Panel    Troponin    Lipase    Cardiac Monitor    Pulse Oximetry    EKG 12 Lead    Saline lock IV        No follow-up provider specified.     [name] [name] is a 65 y.o. male who presents to the Emergency Department with chief complaint of    Chief Complaint   Patient presents with    Chest Pain      65 year old male complaining of chest discomfort.  He refused to call pain.  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  Patient also complains of shotty lymphadenopathy in his groin and his neck.  He has had general malaise and fatigue.    The history is provided by the patient.   Chest Pain  Chest pain location: No specific location.  Pain quality comment:  Will not  collect pain only discomfort  Pain radiates to:  Does not radiate  Pain severity:  Mild  Onset quality:  Gradual  Associated symptoms: no fever      Review of Systems   Constitutional: Negative.  Negative for activity change, appetite change, chills and fever.   HENT: Negative.     Eyes: Negative.    Respiratory: Negative.     Cardiovascular:  Positive for chest pain.   Gastrointestinal: Negative.    Endocrine: Negative.    Musculoskeletal: Negative.    Skin: Negative.    Neurological: Negative.    Hematological: Negative.    Psychiatric/Behavioral: Negative.     All other systems reviewed and are negative.   All other systems reviewed and are negative.      Past Medical History:   Diagnosis Date    Atrial fibrillation  with RVR (HCC) 11/05/2017    CAD (coronary artery disease)     CVA (cerebral vascular accident) (HCC) 09/05/2017    Dyslipidemia     Hypertension     Unstable angina (HCC) 02/02/2010        Past Surgical History:   Procedure Laterality Date    LEFT HEART CATH,PERCUTANEOUS  02/02/2010    stent x1    PR CARDIAC SURG PROCEDURE UNLIST  11/2009    stent x1 LAD         No family history on file.     Social Connections: Not on file        Allergies   Allergen Reactions    Acetaminophen Hives    Aspirin Hives    Gluten Meal Other (See Comments)     Abdominal pain and hip pain        Vitals signs and nursing note reviewed.   No data found.         Physical Exam  Vitals and nursing note reviewed.   Constitutional:       Appearance: Normal appearance. He is obese.   HENT:      Head: Normocephalic.      Right Ear: External ear normal.      Left Ear: External ear normal.      Nose: Nose normal. No congestion.      Mouth/Throat:      Mouth: Mucous membranes are moist.      Pharynx: Oropharynx is clear.   Eyes:      Extraocular Movements: Extraocular movements intact.      Conjunctiva/sclera: Conjunctivae normal.      Pupils: Pupils are equal, round, and reactive to light.   Cardiovascular:      Rate and Rhythm:  Normal rate and regular rhythm.      Pulses: Normal pulses.      Heart sounds: Normal heart sounds. No murmur heard.  Pulmonary:      Effort: Pulmonary effort is normal. No respiratory distress.      Breath sounds: Normal breath sounds.   Abdominal:      General: There is no distension.      Palpations: Abdomen is soft.      Tenderness: There is no abdominal tenderness.   Musculoskeletal:         General: Normal range of motion.      Cervical back: Normal range of motion and neck supple. No rigidity.   Skin:     General: Skin is warm and dry.      Capillary Refill: Capillary refill takes less than 2 seconds.   Neurological:      General: No focal deficit present.      Mental Status: He is alert.      Cranial Nerves: No cranial nerve deficit.      Motor: No weakness.   Psychiatric:         Mood and Affect: Mood normal.         Behavior: Behavior normal.         Thought Content: Thought content normal.         Judgment: Judgment normal.        EKG 12 Lead    Date/Time: 11/16/2020 12:29 AM  Performed by: Lauris Poag, MD  Authorized by: Lauris Poag, MD     ECG reviewed by ED Physician in the absence of a cardiologist: yes    Previous ECG:     Previous ECG:  Compared to current  Interpretation:     Interpretation: normal    Rate:     ECG rate assessment: normal    Rhythm:     Rhythm: paced    Pacing:     Capture:  Complete  Ectopy:     Ectopy: none    QRS:     QRS conduction: LBBB      Labs Reviewed   CBC - Abnormal; Notable for the following components:       Result Value    RDW 15.1 (*)     All other components within normal limits   COMPREHENSIVE METABOLIC PANEL - Abnormal; Notable for the following components:    BUN 26 (*)     GFR Non-African American 59 (*)     Albumin/Globulin Ratio 1.1 (*)     All other components within normal limits   TROPONIN   TROPONIN   LIPASE        XR CHEST (2 VW)   Final Result   No acute airspace disease.                    Glasgow Coma Scale  Eye Opening: Spontaneous  Best Verbal  Response: Oriented  Best Motor Response: Obeys commands  Glasgow Coma Scale Score: 15                     CIWA Assessment  BP: (!) 154/90  Heart Rate: 70                       Voice dictation software was used during the making of this note.  This software is not perfect and grammatical and other typographical errors may be present.  This note has not been completely proofread for errors.        Lauris Poag, MD  11/16/20 [phone]

## 2020-11-16 ENCOUNTER — Inpatient Hospital Stay: Admit: 2020-11-16 | Primary: Adult Health

## 2020-11-16 ENCOUNTER — Inpatient Hospital Stay
Admit: 2020-11-16 | Discharge: 2020-11-16 | Disposition: A | Discharge status: Home / Self Care | Attending: Emergency Medicine

## 2020-11-16 LAB — COMPREHENSIVE METABOLIC PANEL
ALT: 26 U/L (ref 12–65)
AST: 28 U/L (ref 15–37)
Albumin/Globulin Ratio: 1.1 — ABNORMAL LOW (ref 1.2–3.5)
Albumin: 3.8 g/dL (ref 3.2–4.6)
Alk Phosphatase: 99 U/L (ref 50–136)
Anion Gap: 8 mmol/L (ref 7–16)
BUN: 26 MG/DL — ABNORMAL HIGH (ref 8–23)
CO2: 24 mmol/L (ref 21–32)
Calcium: 9.1 MG/DL (ref 8.3–10.4)
Chloride: 106 mmol/L (ref 98–107)
Creatinine: 1.3 MG/DL (ref 0.8–1.5)
GFR African American: >60 mL/min/{1.73_m2} (ref >60)
GFR Non-African American: 59 mL/min/{1.73_m2} — ABNORMAL LOW (ref >60)
Globulin: 3.5 g/dL (ref 2.3–3.5)
Glucose: 98 mg/dL (ref 65–100)
Potassium: 4.1 mmol/L (ref 3.5–5.1)
Sodium: 138 mmol/L (ref 136–145)
Total Bilirubin: 0.5 MG/DL (ref 0.2–1.1)
Total Protein: 7.3 g/dL (ref 6.3–8.2)

## 2020-11-16 LAB — CBC
Hematocrit: 47.4 % (ref 41.1–50.3)
Hemoglobin: 15.4 g/dL (ref 13.6–17.2)
MCH: 29 PG (ref 26.1–32.9)
MCHC: 32.5 g/dL (ref 31.4–35.0)
MCV: 89.3 FL (ref 79.6–97.8)
MPV: 11.6 FL (ref 9.4–12.3)
Platelets: 200 10*3/uL (ref 150–450)
RBC: 5.31 M/uL (ref 4.23–5.6)
RDW: 15.1 % — ABNORMAL HIGH (ref 11.9–14.6)
WBC: 7.6 10*3/uL (ref 4.3–11.1)
nRBC: 0 10*3/uL (ref 0.0–0.2)

## 2020-11-16 LAB — LIPASE: Lipase: 101 U/L (ref 73–393)

## 2020-11-16 LAB — EKG 12-LEAD
Atrial Rate: 416 {beats}/min
Q-T Interval: 456 ms
QRS Duration: 176 ms
QTc Calculation (Bazett): 492 ms
R Axis: -83 degrees
T Axis: 94 degrees
Ventricular Rate: 70 {beats}/min

## 2020-11-16 LAB — TROPONIN
Troponin, High Sensitivity: 12 pg/mL (ref 0–14)
Troponin, High Sensitivity: 12.1 pg/mL (ref 0–14)

## 2020-11-16 MED ORDER — FAMOTIDINE (PF) 20 MG/2ML IV SOLN
20 MG/2ML | Freq: Two times a day (BID) | INTRAVENOUS | Status: DC
Start: 2020-11-16 — End: 2020-11-16
  Administered 2020-11-16: 06:00:00 20 mg via INTRAVENOUS

## 2020-11-16 MED FILL — FAMOTIDINE (PF) 20 MG/2 ML IV SOLN: 20 MG/2ML | INTRAVENOUS | Qty: 2

## 2020-11-16 NOTE — ED Notes (Signed)
I have reviewed discharge instructions with the patient.  The patient verbalized understanding.    Patient left ED via Discharge Method: ambulatory to Home with wife.       Opportunity for questions and clarification provided.       Patient given 0 scripts.         To continue your aftercare when you leave the hospital, you may receive an automated call from our care team to check in on how you are doing.  This is a free service and part of our promise to provide the best care and service to meet your aftercare needs.??? If you have questions, or wish to unsubscribe from this service please call [phone].  Thank you for Choosing our [facility] Emergency Department.        Doree Fudge, RN  11/16/20 [phone]

## 2020-12-06 ENCOUNTER — Ambulatory Visit: Admit: 2020-12-06 | Discharge: 2020-12-06 | Attending: Cardiovascular Disease | Primary: Adult Health

## 2020-12-06 ENCOUNTER — Encounter: Admit: 2020-12-06 | Primary: Adult Health

## 2020-12-06 DIAGNOSIS — I2109 ST elevation (STEMI) myocardial infarction involving other coronary artery of anterior wall: Secondary | ICD-10-CM

## 2020-12-06 MED ORDER — LOSARTAN POTASSIUM 25 MG PO TABS
25 MG | ORAL_TABLET | Freq: Every day | ORAL | 3 refills | Status: AC
Start: 2020-12-06 — End: 2021-12-15

## 2020-12-06 MED ORDER — APIXABAN 5 MG PO TABS
5 MG | ORAL_TABLET | Freq: Two times a day (BID) | ORAL | 3 refills | Status: AC
Start: 2020-12-06 — End: 2021-12-15

## 2020-12-06 MED ORDER — METOPROLOL SUCCINATE ER 100 MG PO TB24
100 MG | ORAL_TABLET | Freq: Every day | ORAL | 3 refills | Status: AC
Start: 2020-12-06 — End: 2021-12-15

## 2020-12-06 NOTE — Progress Notes (Signed)
[facility]  [address], [address]  [place], [place] [address]  PHONE: [phone]        12/06/20      NAME:  [name] [name]  DOB: 02/02/1956  MRN: [id number]     Follow Up    ASSESSMENT and PLAN:  [name] [name] was seen today for irregular heart beat and device check.    Diagnoses and all orders for this visit:    Anterior myocardial infarction [facility])    Unstable angina (HCC)    Dilated cardiomyopathy (HCC)    Atrial fibrillation with RVR (HCC)    Thrombus of left atrial appendage    Status post biventricular pacemaker    Coronary artery disease of native artery of native heart with stable angina pectoris [facility])    Primary hypertension    65 year old male with persistent AF with finding of LAA thrombus and reduced EF. He is s/p CRT-D/AVJ ablation. For the most part he feels well. He did go to the ED in early August with nonspecific symptoms, possibly GI, which have improved.       -NICM - Reviewed device interrogation, stable. Heart logic with very low HF score (good sign).    -Continue GDMT.    -Encouraged weight loss.    -Check echo.     -Permanent AF and history LAA thrombus - Continue Eliquis.    -GI issues - encouraged him to see his PCP and/or GI physician.    -EP Follow up in 1 year or PRN.     Patient has been instructed and agrees to call our office with any issues or other concerns related to their cardiac condition(s) and/or complaint(s).    No follow-up provider specified.    Thank you for allowing me to participate in the electrophysiologic care of [name] [name]. Please contact me if any questions or concerns were to arise.    [name]. Toryn Mcclinton MD, MS  Clinical Cardiac Electrophysiology  [facility]  12/06/20  9:55 AM    ===================================================================  Chief Complant:    Chief Complaint   Patient presents with    Irregular Heart Beat     Year fu    Device Check        Consultation is requested by No ref. provider found for evaluation of Irregular  Heart Beat (Year fu) and Device Check    History:  [name] [name] is a most pleasant 65 y.o. male with a past medical and cardiac history significant for anterior MI, CVA (May 2019, received TPA) , HTN, CAD, dyslipidemia, AFIB and HTN  who underwent TEE on 11/05/17. He was found to have a reduced EF of 25% with anterior apical akinesis. He was found to have an LA thrombus that was confirmed with definity. Patient was admitted and started on IV heparin with bridge to warfarin.      He comes in for followup. He is now s/p CRT-D/AVJ and feels well. Mild dyspnea, but controlled. He has been seen in the ED which appears to be primarily for GI related issues which have improved. The patient otherwise denies chest pain, dyspnea, presyncope, syncope or lateralizing symptoms.     Father died from complications of COVID in 2020.       Cardiac PMH: (Old records have been reviewed and summarized below)      EKG:  (EKG has been independently visualized by me with interpretation below): Atrial fibrillation with  mild tachycardia.       ECHO: 11/05/2017   -  Left ventricle: The ventricle was dilated. Systolic function was markedly  reduced. Ejection fraction was estimated in the range of 15 % to 20 %.     -  Right ventricle: Systolic function was reduced.    -  Left atrium: The atrium was dilated.  -  Left atrial appendage: There was continuous spontaneous echo contrast   ("smoke") in the appendage. There was thrombus noted in the LAA. Due to  thrombus cardioversion was not performed.    -  Atrial septum: Appeared to be a PFO by color doppler.     -  Right atrium: The atrium was dilated.    -  Mitral valve: There was mild regurgitation.    -  Tricuspid valve: There was mild regurgitation.    -  Aorta, systemic arteries: There was mild atheroma.      Previous Heart Catheterization: 09/2010   CORONARY ARTERIOGRAMS   1. Both the right and left coronaries were large and patulous   proximally.   2. The left main coronary was quite large,  was huge, and then tapered to   the LAD, circumflex systems.   3. The LAD coronary had evidence of proximal stenting which emanated from   the left main. The stented segment appeared widely patent with a good   inflow and outflow and no significant disruption. No luminal narrowing   and TIMI-3 flow in the distal LAD. There were no significant mid LAD   stenoses.   4. The circumflex coronary artery provided a ramus intermedius and a   moderate mid vessel marginal. The ramus intermedius was tortuous but   appeared free of significant disease. There was a high diagonal as well   which was tortuous and paralleled the ramus. It was small in caliber and   extent, was diffusely irregular but no high-grade stenosis.   5. The circumflex coronary artery contained minor irregularity proximally   and then had a 40-50% stenosis. It was ectatic before it gave rise to the   mid vessel marginal. The mid vessel marginal had 50% stenosis in its   origin. The circumflex after the marginal origin contained an 80%   stenosis but was very limited in the distal circulation. On review of the   left system anatomy. There was no significant change from that previously   in October 2011.   6. The right coronary artery was also patulous and irregular, large   caliber trunk, tortuous with a mid vessel 40% stenosis, with a small   button of ulceration. Then was diffusely irregular throughout its distal   half. There was evidence of previous stenting from the distal right into   the PDA. The stented segment was widely patent. There was good luminal   appearance inflow and outflow to a large PDA. There was a subsegmental   PDA which was patent and had minor irregularity throughout. This was much   smaller in caliber than the primary PDA.   7. 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       IMPRESSION   1. Stable pattern of diffuse coronary  disease as described.   2. Mild reduction in left ventricular systolic function with an apical   wall motion abnormality.      Stress Test: n/a       DEVICE INTERROGATION: BSC, implanted 06/2019. Stable lead parameters, BIV paced 100%.  Past Medical History, Past Surgical History, Family history, Social History, and Medications were all reviewed with the patient today and updated as necessary.     Current Outpatient Medications   Medication Sig Dispense Refill    apixaban (ELIQUIS) 5 MG TABS tablet Take 1 tablet by mouth 2 times daily 180 tablet 3    losartan (COZAAR) 25 MG tablet Take 1 tablet by mouth daily 90 tablet 3    metoprolol succinate (TOPROL XL) 100 MG extended release tablet Take 0.5 tablets by mouth daily 90 tablet 3    clopidogrel (PLAVIX) 75 MG tablet Take 75 mg by mouth daily      furosemide (LASIX) 40 MG tablet Take 40 mg by mouth daily as needed      nitroGLYCERIN (NITROSTAT) 0.4 MG SL tablet Place 0.4 mg under the tongue as needed      potassium chloride (KLOR-CON M) 10 MEQ extended release tablet Take 10 mEq by mouth 2 times daily as needed       No current facility-administered medications for this visit.     Allergies   Allergen Reactions    Acetaminophen Hives    Aspirin Hives    Gluten Meal Other (See Comments)     Abdominal pain and hip pain       Past Medical History:   Diagnosis Date    Atrial fibrillation with RVR (HCC) 11/05/2017    CAD (coronary artery disease)     CVA (cerebral vascular accident) (HCC) 09/05/2017    Dyslipidemia     Hypertension     Unstable angina (HCC) 02/02/2010     Past Surgical History:   Procedure Laterality Date    LEFT HEART CATH,PERCUTANEOUS  02/02/2010    stent x1    PR CARDIAC SURG PROCEDURE UNLIST  11/2009    stent x1 LAD      History reviewed. No pertinent family history.  Social History     Tobacco Use    Smoking status: Never    Smokeless tobacco: Never   Substance Use Topics    Alcohol use: No       ROS:  A comprehensive review of systems was performed  with the pertinent positives and negatives as noted in the HPI in addition to:  Review of Systems   Constitutional: Negative.    HENT: Negative.     Eyes: Negative.    Respiratory: Negative.     Cardiovascular: Negative.    Gastrointestinal: Negative.    Endocrine: Negative.    Genitourinary: Negative.    Musculoskeletal: Negative.    Skin: Negative.    Allergic/Immunologic: Negative.    Neurological: Negative.    Hematological: Negative.    Psychiatric/Behavioral: Negative.     All other systems reviewed and are negative.      PHYSICAL EXAM:   BP (!) 160/98    Pulse 70    Ht 6\' 4"  (1.93 m)    Wt 258 lb (117 kg)    BMI 31.40 kg/m??      Wt Readings from Last 3 Encounters:   12/06/20 258 lb (117 kg)   11/15/20 255 lb (115.7 kg)     BP Readings from Last 3 Encounters:   12/06/20 (!) 160/98   11/16/20 (!) 154/90       Gen: Well appearing, well developed, no acute distress  Eyes: Pupils equal, round. Extraocular movements are intact  ENT: Oropharynx clear, no oral lesions, normal dentition  CV: S1S2, regular rate and rhythm, no murmurs, rubs  or gallops, normal JVD, no carotid bruits, normal distal pulses, no LEE, left sided CIED C/D/I.   Pulm: Clear to auscultation bilaterally, no accessory muscle uses, no wheezes or rales  GI: Soft, NT, ND, +BS  Neuro: Alert and oriented, nonfocal  Psych: Appropriate affect  Skin: Normal color and skin turgor  MSK: Normal muscle bulk and tone    Medical problems and test results were reviewed with the patient today.     No results found for any visits on 12/06/20.

## 2020-12-28 ENCOUNTER — Encounter

## 2021-01-06 ENCOUNTER — Ambulatory Visit: Payer: Managed Care, Other (non HMO) | Admitting: Dermatology

## 2021-01-12 ENCOUNTER — Encounter: Primary: Adult Health

## 2021-02-14 ENCOUNTER — Encounter: Payer: Self-pay | Admitting: Dermatology

## 2021-02-14 ENCOUNTER — Other Ambulatory Visit: Payer: Self-pay

## 2021-02-14 ENCOUNTER — Ambulatory Visit (INDEPENDENT_AMBULATORY_CARE_PROVIDER_SITE_OTHER): Payer: Managed Care, Other (non HMO) | Admitting: Dermatology

## 2021-02-14 DIAGNOSIS — L570 Actinic keratosis: Secondary | ICD-10-CM | POA: Diagnosis not present

## 2021-02-14 DIAGNOSIS — Z1283 Encounter for screening for malignant neoplasm of skin: Secondary | ICD-10-CM

## 2021-02-14 DIAGNOSIS — L814 Other melanin hyperpigmentation: Secondary | ICD-10-CM

## 2021-02-14 DIAGNOSIS — L719 Rosacea, unspecified: Secondary | ICD-10-CM | POA: Diagnosis not present

## 2021-02-14 DIAGNOSIS — L578 Other skin changes due to chronic exposure to nonionizing radiation: Secondary | ICD-10-CM | POA: Diagnosis not present

## 2021-02-14 DIAGNOSIS — D229 Melanocytic nevi, unspecified: Secondary | ICD-10-CM

## 2021-02-14 DIAGNOSIS — D1800 Hemangioma unspecified site: Secondary | ICD-10-CM

## 2021-02-14 DIAGNOSIS — D1722 Benign lipomatous neoplasm of skin and subcutaneous tissue of left arm: Secondary | ICD-10-CM

## 2021-02-14 DIAGNOSIS — L821 Other seborrheic keratosis: Secondary | ICD-10-CM

## 2021-02-14 DIAGNOSIS — D692 Other nonthrombocytopenic purpura: Secondary | ICD-10-CM

## 2021-02-14 DIAGNOSIS — L918 Other hypertrophic disorders of the skin: Secondary | ICD-10-CM

## 2021-02-14 NOTE — Patient Instructions (Addendum)

## 2021-02-14 NOTE — Progress Notes (Signed)
Follow-Up Visit   Subjective  Stephen Sosa. is a 65 y.o. male who presents for the following: Follow-up (Patient here today for AK follow up, UBSE. Patient does have a hx of dysplastic nevi. He is not aware of any new or changing spots today.).  The following portions of the chart were reviewed this encounter and updated as appropriate:   Tobacco  Allergies  Meds  Problems  Med Hx  Surg Hx  Fam Hx     Review of Systems:  No other skin or systemic complaints except as noted in HPI or Assessment and Plan.  Objective  Well appearing patient in no apparent distress; mood and affect are within normal limits.  All skin waist up examined.  left medial tricep 3.0 cm rubbery nodule  nose Mild erythema with telangiectasias  crown scalp x 1, forehead x 1 (2) Erythematous thin papules/macules with gritty scale.    Assessment & Plan  Lipoma of left upper extremity left medial tricep  Benign-appearing.  Observation.  Call clinic for new or changing lesions.  Recommend daily use of broad spectrum spf 30+ sunscreen to sun-exposed areas.    Rosacea nose  Rosacea is a chronic progressive skin condition usually affecting the face of adults, causing redness and/or acne bumps. It is treatable but not curable. It sometimes affects the eyes (ocular rosacea) as well. It may respond to topical and/or systemic medication and can flare with stress, sun exposure, alcohol, exercise and some foods.  Daily application of broad spectrum spf 30+ sunscreen to face is recommended to reduce flares.  Recommend laser for best treatment  AK (actinic keratosis) (2) crown scalp x 1, forehead x 1  Destruction of lesion - crown scalp x 1, forehead x 1 Complexity: simple   Destruction method: cryotherapy   Informed consent: discussed and consent obtained   Timeout:  patient name, date of birth, surgical site, and procedure verified Lesion destroyed using liquid nitrogen: Yes   Region frozen  until ice ball extended beyond lesion: Yes   Outcome: patient tolerated procedure well with no complications   Post-procedure details: wound care instructions given    Skin cancer screening  Lentigines - Scattered tan macules - Due to sun exposure - Benign-appearing, observe - Recommend daily broad spectrum sunscreen SPF 30+ to sun-exposed areas, reapply every 2 hours as needed. - Call for any changes  Seborrheic Keratoses - Stuck-on, waxy, tan-brown papules and/or plaques  - Benign-appearing - Discussed benign etiology and prognosis. - Observe - Call for any changes  Melanocytic Nevi - Tan-brown and/or pink-flesh-colored symmetric macules and papules - Benign appearing on exam today - Observation - Call clinic for new or changing moles - Recommend daily use of broad spectrum spf 30+ sunscreen to sun-exposed areas.   Hemangiomas - Red papules - Discussed benign nature - Observe - Call for any changes  Actinic Damage - Chronic condition, secondary to cumulative UV/sun exposure - diffuse scaly erythematous macules with underlying dyspigmentation - Recommend daily broad spectrum sunscreen SPF 30+ to sun-exposed areas, reapply every 2 hours as needed.  - Staying in the shade or wearing long sleeves, sun glasses (UVA+UVB protection) and wide brim hats (4-inch brim around the entire circumference of the hat) are also recommended for sun protection.  - Call for new or changing lesions.  Skin cancer screening performed today.  Purpura - Chronic; persistent and recurrent.  Treatable, but not curable. - Violaceous macules and patches - Benign - Related to trauma,  age, sun damage and/or use of blood thinners, chronic use of topical and/or oral steroids - Observe - Can use OTC arnica containing moisturizer such as Dermend Bruise Formula if desired - Call for worsening or other concerns  Acrochordons (Skin Tags) - Fleshy, skin-colored pedunculated papules - Benign appearing.   - Observe. - If desired, they can be removed with an in office procedure that is not covered by insurance. - Please call the clinic if you notice any new or changing lesions.  Return in about 1 year (around 02/14/2022) for TBSE.  Graciella Belton, RMA, am acting as scribe for Sarina Ser, MD . Documentation: I have reviewed the above documentation for accuracy and completeness, and I agree with the above.  Sarina Ser, MD

## 2021-04-07 ENCOUNTER — Encounter

## 2021-04-19 ENCOUNTER — Telehealth: Payer: Self-pay | Admitting: Internal Medicine

## 2021-04-19 ENCOUNTER — Other Ambulatory Visit: Payer: Self-pay | Admitting: Internal Medicine

## 2021-04-19 DIAGNOSIS — M159 Polyosteoarthritis, unspecified: Secondary | ICD-10-CM

## 2021-04-19 DIAGNOSIS — J309 Allergic rhinitis, unspecified: Secondary | ICD-10-CM

## 2021-04-19 DIAGNOSIS — J4520 Mild intermittent asthma, uncomplicated: Secondary | ICD-10-CM

## 2021-04-19 MED ORDER — BUDESONIDE-FORMOTEROL FUMARATE 160-4.5 MCG/ACT IN AERO: 1.0000 | INHALATION_SPRAY | Freq: Two times a day (BID) | RESPIRATORY_TRACT | 1 refills | Status: DC

## 2021-04-19 MED ORDER — MELOXICAM 15 MG PO TABS: 15.0000 mg | ORAL_TABLET | Freq: Every day | ORAL | 0 refills | Status: DC

## 2021-04-19 NOTE — Telephone Encounter (Signed)
1.Medication Requested: budesonide-formoterol (SYMBICORT) 160-4.5 MCG/ACT inhaler meloxicam (MOBIC) 15 MG tablet  2. Pharmacy (Name, Street, [place]): [facility] [place]  3. On Med List: yes  4. Last Visit with PCP: 04-26-2020  5. Next visit date with PCP: 05-26-2021   Agent: Please be advised that RX refills may take up to 3 business days. We ask that you follow-up with your pharmacy.

## 2021-05-26 ENCOUNTER — Encounter: Payer: Self-pay | Admitting: Internal Medicine

## 2021-05-26 ENCOUNTER — Ambulatory Visit (INDEPENDENT_AMBULATORY_CARE_PROVIDER_SITE_OTHER): Payer: Managed Care, Other (non HMO) | Admitting: Internal Medicine

## 2021-05-26 ENCOUNTER — Other Ambulatory Visit: Payer: Self-pay

## 2021-05-26 VITALS — BP 142/94 | HR 68 | Temp 97.8°F | Resp 16 | Ht 69.0 in | Wt 208.0 lb

## 2021-05-26 DIAGNOSIS — J4520 Mild intermittent asthma, uncomplicated: Secondary | ICD-10-CM | POA: Diagnosis not present

## 2021-05-26 DIAGNOSIS — M159 Polyosteoarthritis, unspecified: Secondary | ICD-10-CM

## 2021-05-26 DIAGNOSIS — Z0001 Encounter for general adult medical examination with abnormal findings: Secondary | ICD-10-CM | POA: Diagnosis not present

## 2021-05-26 DIAGNOSIS — K219 Gastro-esophageal reflux disease without esophagitis: Secondary | ICD-10-CM

## 2021-05-26 DIAGNOSIS — Z23 Encounter for immunization: Secondary | ICD-10-CM

## 2021-05-26 DIAGNOSIS — I10 Essential (primary) hypertension: Secondary | ICD-10-CM

## 2021-05-26 DIAGNOSIS — M150 Primary generalized (osteo)arthritis: Secondary | ICD-10-CM

## 2021-05-26 DIAGNOSIS — Z1211 Encounter for screening for malignant neoplasm of colon: Secondary | ICD-10-CM

## 2021-05-26 DIAGNOSIS — N181 Chronic kidney disease, stage 1: Secondary | ICD-10-CM

## 2021-05-26 LAB — CBC WITH DIFFERENTIAL/PLATELET
Basophils Absolute: 0.1 10*3/uL (ref 0.0–0.1)
Basophils Relative: 0.8 % (ref 0.0–3.0)
Eosinophils Absolute: 0.1 10*3/uL (ref 0.0–0.7)
Eosinophils Relative: 1.5 % (ref 0.0–5.0)
HCT: 51.4 % (ref 39.0–52.0)
Hemoglobin: 17 g/dL (ref 13.0–17.0)
Lymphocytes Relative: 17.7 % (ref 12.0–46.0)
Lymphs Abs: 1.3 10*3/uL (ref 0.7–4.0)
MCHC: 33.1 g/dL (ref 30.0–36.0)
MCV: 92.8 fl (ref 78.0–100.0)
Monocytes Absolute: 0.4 10*3/uL (ref 0.1–1.0)
Monocytes Relative: 5.5 % (ref 3.0–12.0)
Neutro Abs: 5.5 10*3/uL (ref 1.4–7.7)
Neutrophils Relative %: 74.5 % (ref 43.0–77.0)
Platelets: 217 10*3/uL (ref 150.0–400.0)
RBC: 5.54 Mil/uL (ref 4.22–5.81)
RDW: 13.6 % (ref 11.5–15.5)
WBC: 7.4 10*3/uL (ref 4.0–10.5)

## 2021-05-26 LAB — BASIC METABOLIC PANEL
BUN: 19 mg/dL (ref 6–23)
CO2: 34 mEq/L — ABNORMAL HIGH (ref 19–32)
Calcium: 9.9 mg/dL (ref 8.4–10.5)
Chloride: 103 mEq/L (ref 96–112)
Creatinine, Ser: 1.39 mg/dL (ref 0.40–1.50)
GFR: 53.46 mL/min — ABNORMAL LOW (ref >60.00)
Glucose, Bld: 111 mg/dL — ABNORMAL HIGH (ref 70–99)
Potassium: 6 mEq/L — ABNORMAL HIGH (ref 3.5–5.1)
Sodium: 142 mEq/L (ref 135–145)

## 2021-05-26 LAB — HEPATIC FUNCTION PANEL
ALT: 31 U/L (ref 0–53)
AST: 20 U/L (ref 0–37)
Albumin: 4.3 g/dL (ref 3.5–5.2)
Alkaline Phosphatase: 51 U/L (ref 39–117)
Bilirubin, Direct: 0.2 mg/dL (ref 0.0–0.3)
Total Bilirubin: 1.2 mg/dL (ref 0.2–1.2)
Total Protein: 6.9 g/dL (ref 6.0–8.3)

## 2021-05-26 LAB — TSH: TSH: 1.13 u[IU]/mL (ref 0.35–5.50)

## 2021-05-26 LAB — URINALYSIS, ROUTINE W REFLEX MICROSCOPIC
Bilirubin Urine: NEGATIVE
Ketones, ur: NEGATIVE
Leukocytes,Ua: NEGATIVE
Nitrite: NEGATIVE
Specific Gravity, Urine: 1.01 (ref 1.000–1.030)
Total Protein, Urine: NEGATIVE
Urine Glucose: NEGATIVE
Urobilinogen, UA: 0.2 (ref 0.0–1.0)
WBC, UA: NONE SEEN (ref 0–?)
pH: 7 (ref 5.0–8.0)

## 2021-05-26 LAB — PSA: PSA: 1.17 ng/mL (ref 0.10–4.00)

## 2021-05-26 NOTE — Assessment & Plan Note (Signed)
The 10-year ASCVD risk score (Arnett DK, et al., 2019) is: 10.3%   Values used to calculate the score:     Age: 66 years     Sex: Male     Is Non-Hispanic African American: No     Diabetic: No     Tobacco smoker: No     Systolic Blood Pressure: 537 mmHg     Is BP treated: No     HDL Cholesterol: 77.7 mg/dL     Total Cholesterol: 187 mg/dL   Estimated Creatinine Clearance: 60.8 mL/min (by C-G formula based on SCr of 1.39 mg/dL).

## 2021-05-26 NOTE — Patient Instructions (Signed)

## 2021-05-26 NOTE — Progress Notes (Signed)
Subjective:  Patient ID: Stephen Sosa., male    DOB: [birth year]  Age: 66 y.o. MRN: [id number]  CC: Annual Exam, Osteoarthritis, Gastroesophageal Reflux, and Hypertension  This visit occurred during the SARS-CoV-2 public health emergency.  Safety protocols were in place, including screening questions prior to the visit, additional usage of staff PPE, and extensive cleaning of exam room while observing appropriate contact time as indicated for disinfecting solutions.    HPI Stephen Sosa. presents for a CPX and f/up -   He is active and denies chest pain, shortness of breath, diaphoresis, edema, or fatigue.  His asthma symptoms are well controlled with Symbicort.  Outpatient Medications Prior to Visit  Medication Sig Dispense Refill   budesonide-formoterol (SYMBICORT) 160-4.5 MCG/ACT inhaler Inhale 1 puff into the lungs 2 (two) times daily. 1 each 1   meloxicam (MOBIC) 15 MG tablet Take 1 tablet (15 mg total) by mouth daily. 90 tablet 0   mometasone (ELOCON) 0.1 % cream APPLY A SMALL AMOUNT TO AFFECTED AREA AS DIRECTED 1-2 TIMES A DAY TO AFFECTED AREA,RASH ON LEGS, FLARES 45 g 1   pantoprazole (PROTONIX) 40 MG tablet Take 1 tablet (40 mg total) by mouth daily. 90 tablet 1   budesonide (RHINOCORT AQUA) 32 MCG/ACT nasal spray Place 2 sprays into both nostrils daily. 1 g 1   No facility-administered medications prior to visit.    ROS Review of Systems  Constitutional:  Positive for unexpected weight change (wt gain). Negative for appetite change, chills, diaphoresis, fatigue and fever.  HENT: Negative.    Respiratory: Negative.  Negative for cough, chest tightness, shortness of breath and wheezing.   Cardiovascular:  Negative for chest pain, palpitations and leg swelling.  Gastrointestinal:  Negative for abdominal pain, constipation, diarrhea, nausea and vomiting.  Genitourinary: Negative.  Negative for difficulty urinating, dysuria and hematuria.  Musculoskeletal:  Positive for  arthralgias. Negative for back pain and myalgias.  Skin: Negative.  Negative for color change and pallor.  Neurological:  Negative for dizziness, weakness and light-headedness.  Hematological:  Negative for adenopathy. Does not bruise/bleed easily.  Psychiatric/Behavioral: Negative.     Objective:  BP (!) 142/94 (BP Location: Right Arm, Patient Position: Sitting, Cuff Size: Large)    Pulse 68    Temp 97.8 F (36.6 C) (Oral)    Resp 16    Ht 5\' 9"  (1.753 m)    Wt 208 lb (94.3 kg)    SpO2 97%    BMI 30.72 kg/m   BP Readings from Last 3 Encounters:  05/26/21 (!) 142/94  04/26/20 118/76  03/24/19 (!) 145/87    Wt Readings from Last 3 Encounters:  05/26/21 208 lb (94.3 kg)  04/26/20 209 lb (94.8 kg)  03/24/19 199 lb 3.2 oz (90.4 kg)    Physical Exam Vitals reviewed.  Constitutional:      Appearance: Normal appearance.  HENT:     Nose: Nose normal.     Mouth/Throat:     Mouth: Mucous membranes are moist.  Eyes:     General: No scleral icterus.    Conjunctiva/sclera: Conjunctivae normal.  Cardiovascular:     Rate and Rhythm: Normal rate and regular rhythm.     Heart sounds: Normal heart sounds, S1 normal and S2 normal. No murmur heard.   No gallop.     Comments: EKG- NSR, 64 bpm No LVH or Q waves Pulmonary:     Effort: Pulmonary effort is normal.     Breath sounds: No stridor.  No wheezing, rhonchi or rales.  Abdominal:     General: Abdomen is flat.     Palpations: There is no mass.     Tenderness: There is no abdominal tenderness. There is no guarding or rebound.     Hernia: No hernia is present. There is no hernia in the left inguinal area or right inguinal area.  Genitourinary:    Pubic Area: No rash.      Penis: Normal and circumcised.      Testes: Normal.     Epididymis:     Right: Normal.     Left: Normal.     Prostate: Normal. Not enlarged, not tender and no nodules present.     Rectum: Normal. Guaiac result negative. No mass, tenderness, anal fissure, external  hemorrhoid or internal hemorrhoid. Normal anal tone.  Musculoskeletal:     Right lower leg: No edema.     Left lower leg: No edema.  Lymphadenopathy:     Lower Body: No right inguinal adenopathy. No left inguinal adenopathy.  Skin:    General: Skin is warm.     Findings: No lesion or rash.  Neurological:     General: No focal deficit present.     Mental Status: He is alert.  Psychiatric:        Mood and Affect: Mood normal.        Behavior: Behavior normal.    Lab Results  Component Value Date   WBC 7.4 05/26/2021   HGB 17.0 05/26/2021   HCT 51.4 05/26/2021   PLT 217.0 05/26/2021   GLUCOSE 111 (H) 05/26/2021   CHOL 187 04/26/2020   TRIG 64.0 04/26/2020   HDL 77.70 04/26/2020   LDLCALC 97 04/26/2020   ALT 31 05/26/2021   AST 20 05/26/2021   NA 142 05/26/2021   K 6.0 No hemolysis seen (H) 05/26/2021   CL 103 05/26/2021   CREATININE 1.39 05/26/2021   BUN 19 05/26/2021   CO2 34 (H) 05/26/2021   TSH 1.13 05/26/2021   PSA 1.17 05/26/2021   INR 1.06 01/14/2015   HGBA1C 5.5 12/24/2014    Pulmonary Function Test [facility] Only  Result Date: 02/12/2019 Spirometry Data Is Acceptable and Reproducible Mild Obstructive Airways Disease without  Significant Broncho-Dilator Response +airtrapping Consider outpatient Pulmonary Consultation if needed Clinical Correlation Advised    Assessment & Plan:   Angus was seen today for annual exam, osteoarthritis, gastroesophageal reflux and hypertension.  Diagnoses and all orders for this visit:  Encounter for general adult medical examination with abnormal findings- Exam completed, labs reviewed; statin therapy is not indicated; vaccines reviewed and updated; cancer screenings addressed; patient education was given. -     PSA; Future -     PSA  Screen for colon cancer -     Cologuard  Gastroesophageal reflux disease without esophagitis- Sx's are well controlled -     CBC with Differential/Platelet; Future -     CBC with  Differential/Platelet  Primary osteoarthritis involving multiple joints -     Basic metabolic panel; Future -     Basic metabolic panel  Mild intermittent asthma without complication- His symptoms are well controlled. -     CBC with Differential/Platelet; Future -     CBC with Differential/Platelet  Hypertension, unspecified type- His potassium is elevated but I think this is from hemolysis.  Will recheck. He has very mild renal insufficiency.  He is not willing to take an antihypertensive at this time.  He will improve his lifestyle modifications  and will return in 3 to 6 months for blood pressure recheck. 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 "J.W. Halton Neas" maintain his budesonide, mometasone, pantoprazole, budesonide-formoterol, and meloxicam.  No orders of the defined types were placed in this encounter.    Follow-up: Return in about 6 months (around 11/23/2021).  Scarlette Calico, MD

## 2021-05-27 DIAGNOSIS — N181 Chronic kidney disease, stage 1: Secondary | ICD-10-CM | POA: Insufficient documentation

## 2021-06-07 ENCOUNTER — Other Ambulatory Visit: Payer: Self-pay | Admitting: Internal Medicine

## 2021-06-07 ENCOUNTER — Telehealth: Payer: Self-pay

## 2021-06-07 DIAGNOSIS — I10 Essential (primary) hypertension: Secondary | ICD-10-CM

## 2021-06-07 NOTE — Telephone Encounter (Signed)
Pt states that he will come in on Thursday 06/09/21 for lab recheck.  Pt states that he will send a message in MyChart about the BP medication after the labs come back. Pt was under the impression that the BP medication is only needed is the labs are abnormal again.  Pt may need some clarification about that. I tried to explained that his BP was elevated at his last visit. Pt only wanted to scheduled lab visit with me today.  FYI

## 2021-06-09 ENCOUNTER — Other Ambulatory Visit: Payer: Self-pay | Admitting: Internal Medicine

## 2021-06-09 ENCOUNTER — Other Ambulatory Visit (INDEPENDENT_AMBULATORY_CARE_PROVIDER_SITE_OTHER): Payer: Managed Care, Other (non HMO)

## 2021-06-09 ENCOUNTER — Other Ambulatory Visit: Payer: Self-pay

## 2021-06-09 DIAGNOSIS — I10 Essential (primary) hypertension: Secondary | ICD-10-CM

## 2021-06-09 DIAGNOSIS — E875 Hyperkalemia: Secondary | ICD-10-CM

## 2021-06-09 LAB — BASIC METABOLIC PANEL
BUN: 15 mg/dL (ref 6–23)
CO2: 34 mEq/L — ABNORMAL HIGH (ref 19–32)
Calcium: 9.8 mg/dL (ref 8.4–10.5)
Chloride: 102 mEq/L (ref 96–112)
Creatinine, Ser: 1.28 mg/dL (ref 0.40–1.50)
GFR: 59.01 mL/min — ABNORMAL LOW (ref >60.00)
Glucose, Bld: 92 mg/dL (ref 70–99)
Potassium: 5.2 mEq/L — ABNORMAL HIGH (ref 3.5–5.1)
Sodium: 139 mEq/L (ref 135–145)

## 2021-06-22 ENCOUNTER — Encounter

## 2021-06-22 NOTE — Progress Notes (Signed)
Remote Monitor Report [employer] CRT-D: [phone].  Docket Date 06/14/2021./smb

## 2021-06-24 ENCOUNTER — Other Ambulatory Visit: Payer: Self-pay

## 2021-06-24 ENCOUNTER — Other Ambulatory Visit (INDEPENDENT_AMBULATORY_CARE_PROVIDER_SITE_OTHER): Payer: Managed Care, Other (non HMO)

## 2021-06-24 DIAGNOSIS — I10 Essential (primary) hypertension: Secondary | ICD-10-CM | POA: Diagnosis not present

## 2021-06-24 DIAGNOSIS — E875 Hyperkalemia: Secondary | ICD-10-CM | POA: Diagnosis not present

## 2021-06-24 LAB — URINALYSIS, ROUTINE W REFLEX MICROSCOPIC
Bilirubin Urine: NEGATIVE
Hgb urine dipstick: NEGATIVE
Ketones, ur: NEGATIVE
Leukocytes,Ua: NEGATIVE
Nitrite: NEGATIVE
RBC / HPF: NONE SEEN (ref 0–?)
Specific Gravity, Urine: 1.02 (ref 1.000–1.030)
Total Protein, Urine: NEGATIVE
Urine Glucose: NEGATIVE
Urobilinogen, UA: 0.2 (ref 0.0–1.0)
pH: 6 (ref 5.0–8.0)

## 2021-06-24 LAB — CORTISOL: Cortisol, Plasma: 11.7 ug/dL

## 2021-06-25 LAB — SODIUM, URINE, RANDOM: Sodium, Ur: 65 mmol/L (ref 28–272)

## 2021-06-25 LAB — POTASSIUM, URINE, RANDOM: Potassium Urine: 62 mmol/L (ref 12–129)

## 2021-06-30 LAB — ALDOSTERONE + RENIN ACTIVITY W/ RATIO
ALDO / PRA Ratio: 23.5 Ratio (ref 0.9–28.9)
Aldosterone: 12 ng/dL
Renin Activity: 0.51 ng/mL/h (ref 0.25–5.82)

## 2021-07-17 ENCOUNTER — Other Ambulatory Visit: Payer: Self-pay | Admitting: Internal Medicine

## 2021-07-17 DIAGNOSIS — M159 Polyosteoarthritis, unspecified: Secondary | ICD-10-CM

## 2021-08-24 ENCOUNTER — Other Ambulatory Visit: Payer: Self-pay | Admitting: Internal Medicine

## 2021-08-24 DIAGNOSIS — M159 Polyosteoarthritis, unspecified: Secondary | ICD-10-CM

## 2021-08-24 DIAGNOSIS — M150 Primary generalized (osteo)arthritis: Secondary | ICD-10-CM

## 2021-11-24 ENCOUNTER — Ambulatory Visit: Payer: Managed Care, Other (non HMO) | Admitting: Internal Medicine

## 2021-12-15 ENCOUNTER — Encounter: Admit: 2021-12-15 | Discharge: 2021-12-22 | Payer: MEDICARE | Primary: Adult Health

## 2021-12-15 ENCOUNTER — Ambulatory Visit: Admit: 2021-12-15 | Discharge: 2021-12-15 | Payer: MEDICARE | Attending: Cardiovascular Disease | Primary: Adult Health

## 2021-12-15 DIAGNOSIS — I5020 Unspecified systolic (congestive) heart failure: Secondary | ICD-10-CM

## 2021-12-15 DIAGNOSIS — I420 Dilated cardiomyopathy: Secondary | ICD-10-CM

## 2021-12-15 MED ORDER — METOPROLOL SUCCINATE ER 100 MG PO TB24
100 MG | ORAL_TABLET | Freq: Every day | ORAL | 3 refills | Status: AC
Start: 2021-12-15 — End: 2022-12-07

## 2021-12-15 MED ORDER — LOSARTAN POTASSIUM 25 MG PO TABS
25 MG | ORAL_TABLET | Freq: Every day | ORAL | 3 refills | Status: AC
Start: 2021-12-15 — End: 2022-12-07

## 2021-12-15 MED ORDER — METOPROLOL SUCCINATE ER 100 MG PO TB24
100 MG | ORAL_TABLET | Freq: Every day | ORAL | 3 refills | Status: DC
Start: 2021-12-15 — End: 2021-12-15

## 2021-12-15 MED ORDER — LOSARTAN POTASSIUM 25 MG PO TABS
25 MG | ORAL_TABLET | Freq: Every day | ORAL | 3 refills | Status: DC
Start: 2021-12-15 — End: 2021-12-15

## 2021-12-15 MED ORDER — CLOPIDOGREL BISULFATE 75 MG PO TABS
75 MG | ORAL_TABLET | Freq: Every day | ORAL | 3 refills | Status: AC
Start: 2021-12-15 — End: 2022-12-07

## 2021-12-15 MED ORDER — CLOPIDOGREL BISULFATE 75 MG PO TABS
75 MG | ORAL_TABLET | Freq: Every day | ORAL | 3 refills | Status: DC
Start: 2021-12-15 — End: 2021-12-15

## 2021-12-15 MED ORDER — APIXABAN 5 MG PO TABS
5 MG | ORAL_TABLET | Freq: Two times a day (BID) | ORAL | 3 refills | Status: DC
Start: 2021-12-15 — End: 2021-12-15

## 2021-12-15 MED ORDER — APIXABAN 5 MG PO TABS
5 MG | ORAL_TABLET | Freq: Two times a day (BID) | ORAL | 3 refills | Status: AC
Start: 2021-12-15 — End: 2022-12-07

## 2021-12-15 NOTE — Telephone Encounter (Signed)
Pt was just seen today and we called in some Rxs for pt to [facility] and they will not accept the prescriptions because pt has not been seen in over a year ,so pt needs everything called into [facility] [phone]  Please call pt with questions

## 2021-12-15 NOTE — Progress Notes (Signed)
[facility]  [address], [address]  [place], [place] [address]  PHONE: [phone]        12/15/21      NAME:  [name] [name]  DOB: 02/24/1956  MRN: [id number]     Follow Up    ASSESSMENT and PLAN:  [name] [name] was seen today for irregular heart beat and device check.    Diagnoses and all orders for this visit:    Anterior myocardial infarction [facility])    Unstable angina (HCC)    Dilated cardiomyopathy (HCC)    Atrial fibrillation with RVR (HCC)    Thrombus of left atrial appendage    Status post biventricular pacemaker    Coronary artery disease of native artery of native heart with stable angina pectoris [facility])    Primary hypertension    66 year old male with persistent AF with finding of LAA thrombus and reduced EF. He is s/p CRT-D/AVJ ablation. For the most part he feels well.      -NICM - Reviewed device interrogation, stable. HeartLogic score stable. Continue GDMT.     -Permanent AF and history LAA thrombus - Continue Eliquis.      -CAD - continue plavix/Eliquis.       -Stroke ppx - continue Eliquis.     -Encouraged weight loss.      -Check echo to assess EF/cardiac function.     -HTN - elevated today, stable at home 120s/70s, continue BB and ARB. Lasix PRN.       -GI issues - stable.      -EP Follow up in 1 year or PRN.     Patient has been instructed and agrees to call our office with any issues or other concerns related to their cardiac condition(s) and/or complaint(s).    No follow-up provider specified.    Thank you for allowing me to participate in the electrophysiologic care of [name] [name]. Please contact me if any questions or concerns were to arise.    [name]. Rashauna Tep MD, MS  Clinical Cardiac Electrophysiology  [facility]  12/15/21  10:41 AM    ===================================================================  Chief Complant:    Chief Complaint   Patient presents with    Coronary Artery Disease    Irregular Heart Beat    Hypertension    Device Check        History:  [name] [name]  is a most pleasant 66 y.o. male with a past medical and cardiac history significant for anterior MI, CVA (May 2019, received TPA) , HTN, CAD, dyslipidemia, AFIB and HTN  who underwent TEE on 11/05/17. He was found to have a reduced EF of 25% with anterior apical akinesis. He was found to have an LA thrombus that was confirmed with definity. Patient was admitted and started on IV heparin with bridge to warfarin.      He comes in for followup. He is now s/p CRT-D/AVJ in 2020, continues to fell well. The patient otherwise denies chest pain, dyspnea, presyncope, syncope or lateralizing symptoms.     Father died from complications of COVID in 2020.       Cardiac PMH: (Old records have been reviewed and summarized below)      EKG:  (EKG has been independently visualized by me with interpretation below): Biventricular pacing.       ECHO: 11/05/2017   -  Left ventricle: The ventricle was dilated. Systolic function was markedly  reduced. Ejection fraction was estimated in the range of 15 % to  20 %.     -  Right ventricle: Systolic function was reduced.    -  Left atrium: The atrium was dilated.  -  Left atrial appendage: There was continuous spontaneous echo contrast   ("smoke") in the appendage. There was thrombus noted in the LAA. Due to  thrombus cardioversion was not performed.    -  Atrial septum: Appeared to be a PFO by color doppler.     -  Right atrium: The atrium was dilated.    -  Mitral valve: There was mild regurgitation.    -  Tricuspid valve: There was mild regurgitation.    -  Aorta, systemic arteries: There was mild atheroma.      Previous Heart Catheterization: 09/2010   CORONARY ARTERIOGRAMS   1. Both the right and left coronaries were large and patulous   proximally.   2. The left main coronary was quite large, was huge, and then tapered to   the LAD, circumflex systems.   3. The LAD coronary had evidence of proximal stenting which emanated from   the left main. The stented segment appeared widely patent with  a good   inflow and outflow and no significant disruption. No luminal narrowing   and TIMI-3 flow in the distal LAD. There were no significant mid LAD   stenoses.   4. The circumflex coronary artery provided a ramus intermedius and a   moderate mid vessel marginal. The ramus intermedius was tortuous but   appeared free of significant disease. There was a high diagonal as well   which was tortuous and paralleled the ramus. It was small in caliber and   extent, was diffusely irregular but no high-grade stenosis.   5. The circumflex coronary artery contained minor irregularity proximally   and then had a 40-50% stenosis. It was ectatic before it gave rise to the   mid vessel marginal. The mid vessel marginal had 50% stenosis in its   origin. The circumflex after the marginal origin contained an 80%   stenosis but was very limited in the distal circulation. On review of the   left system anatomy. There was no significant change from that previously   in October 2011.   6. The right coronary artery was also patulous and irregular, large   caliber trunk, tortuous with a mid vessel 40% stenosis, with a small   button of ulceration. Then was diffusely irregular throughout its distal   half. There was evidence of previous stenting from the distal right into   the PDA. The stented segment was widely patent. There was good luminal   appearance inflow and outflow to a large PDA. There was a subsegmental   PDA which was patent and had minor irregularity throughout. This was much   smaller in caliber than the primary PDA.   7. 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       IMPRESSION   1. Stable pattern of diffuse coronary disease as described.   2. Mild reduction in left ventricular systolic function with an apical   wall motion abnormality.      Stress Test: n/a       DEVICE INTERROGATION: BSC, implanted 06/2018. Stable  lead parameters, BIV paced 100%. No VT/VF therapies.     Past Medical History, Past Surgical History, Family history, Social History, and Medications were all reviewed with the patient  today and updated as necessary.     Current Outpatient Medications   Medication Sig Dispense Refill    sildenafil (VIAGRA) 50 MG tablet Viagra 50 mg tablet   1 tab prior to sexual intercourse      apixaban (ELIQUIS) 5 MG TABS tablet Take 1 tablet by mouth 2 times daily 180 tablet 3    losartan (COZAAR) 25 MG tablet Take 1 tablet by mouth daily 90 tablet 3    metoprolol succinate (TOPROL XL) 100 MG extended release tablet Take 0.5 tablets by mouth daily 90 tablet 3    clopidogrel (PLAVIX) 75 MG tablet Take 1 tablet by mouth daily 90 tablet 3    furosemide (LASIX) 40 MG tablet Take 1 tablet by mouth daily as needed      nitroGLYCERIN (NITROSTAT) 0.4 MG SL tablet Place 1 tablet under the tongue as needed      potassium chloride (KLOR-CON M) 10 MEQ extended release tablet Take 1 tablet by mouth 2 times daily as needed       No current facility-administered medications for this visit.     Allergies   Allergen Reactions    Acetaminophen Hives    Aspirin Hives    Dairycare [Lactase-Lactobacillus]     Gluten Meal Other (See Comments)     Abdominal pain and hip pain    Statins Myalgia       Past Medical History:   Diagnosis Date    Atrial fibrillation with RVR (HCC) 11/05/2017    CAD (coronary artery disease)     CVA (cerebral vascular accident) (HCC) 09/05/2017    Dyslipidemia     Hypertension     Unstable angina (HCC) 02/02/2010     Past Surgical History:   Procedure Laterality Date    LEFT HEART CATH,PERCUTANEOUS  02/02/2010    stent x1    PR UNLISTED PROCEDURE CARDIAC SURGERY  11/2009    stent x1 LAD      History reviewed. No pertinent family history.  Social History     Tobacco Use    Smoking status: Never     Passive exposure: Past    Smokeless tobacco: Never   Substance Use Topics    Alcohol use: No       ROS:  A comprehensive review of  systems was performed with the pertinent positives and negatives as noted in the HPI in addition to:  Review of Systems   Constitutional: Negative.    HENT: Negative.     Eyes: Negative.    Respiratory: Negative.     Cardiovascular: Negative.    Gastrointestinal: Negative.    Endocrine: Negative.    Genitourinary: Negative.    Musculoskeletal: Negative.    Skin: Negative.    Allergic/Immunologic: Negative.    Neurological: Negative.    Hematological: Negative.    Psychiatric/Behavioral: Negative.     All other systems reviewed and are negative.      PHYSICAL EXAM:   BP (!) 148/88   Pulse 73   Ht 6\' 4"  (1.93 m)   Wt 263 lb (119.3 kg)   BMI 32.01 kg/m      Wt Readings from Last 3 Encounters:   12/15/21 263 lb (119.3 kg)   12/06/20 258 lb (117 kg)   11/15/20 255 lb (115.7 kg)     BP Readings from Last 3 Encounters:   12/15/21 (!) 148/88   12/06/20 (!) 160/98   11/16/20 (!) 154/90     Gen: Well appearing, well developed, no acute  distress  Eyes: Pupils equal, round. Extraocular movements are intact  ENT: Oropharynx clear, no oral lesions, normal dentition  CV: S1S2, regular rate and rhythm, no murmurs, rubs or gallops, normal JVD, no carotid bruits, normal distal pulses, no LEE, left sided CIED C/D/I.   Pulm: Clear to auscultation bilaterally, no accessory muscle uses, no wheezes or rales  GI: Soft, NT, ND, +BS  Neuro: Alert and oriented, nonfocal  Psych: Appropriate affect  Skin: Normal color and skin turgor  MSK: Normal muscle bulk and tone    Medical problems and test results were reviewed with the patient today.     No results found for any visits on 12/15/21.

## 2021-12-15 NOTE — Telephone Encounter (Signed)
Requested Prescriptions     Pending Prescriptions Disp Refills    metoprolol succinate (TOPROL XL) 100 MG extended release tablet 90 tablet 3     Sig: Take 0.5 tablets by mouth daily    losartan (COZAAR) 25 MG tablet 90 tablet 3     Sig: Take 1 tablet by mouth daily    apixaban (ELIQUIS) 5 MG TABS tablet 180 tablet 3     Sig: Take 1 tablet by mouth 2 times daily    clopidogrel (PLAVIX) 75 MG tablet 90 tablet 3     Sig: Take 1 tablet by mouth daily

## 2022-01-04 ENCOUNTER — Ambulatory Visit: Payer: Managed Care, Other (non HMO)

## 2022-01-04 ENCOUNTER — Encounter: Payer: Self-pay | Admitting: Internal Medicine

## 2022-01-04 ENCOUNTER — Ambulatory Visit (INDEPENDENT_AMBULATORY_CARE_PROVIDER_SITE_OTHER): Payer: Managed Care, Other (non HMO) | Admitting: Internal Medicine

## 2022-01-04 VITALS — BP 144/84 | HR 60 | Temp 97.7°F | Resp 16 | Ht 69.0 in | Wt 204.0 lb

## 2022-01-04 DIAGNOSIS — Z23 Encounter for immunization: Secondary | ICD-10-CM | POA: Diagnosis not present

## 2022-01-04 DIAGNOSIS — I10 Essential (primary) hypertension: Secondary | ICD-10-CM

## 2022-01-04 DIAGNOSIS — Z1211 Encounter for screening for malignant neoplasm of colon: Secondary | ICD-10-CM

## 2022-01-04 DIAGNOSIS — Z0001 Encounter for general adult medical examination with abnormal findings: Secondary | ICD-10-CM | POA: Diagnosis not present

## 2022-01-04 DIAGNOSIS — N182 Chronic kidney disease, stage 2 (mild): Secondary | ICD-10-CM

## 2022-01-04 DIAGNOSIS — S8991XA Unspecified injury of right lower leg, initial encounter: Secondary | ICD-10-CM

## 2022-01-04 DIAGNOSIS — S32010A Wedge compression fracture of first lumbar vertebra, initial encounter for closed fracture: Secondary | ICD-10-CM | POA: Insufficient documentation

## 2022-01-04 DIAGNOSIS — S32010G Wedge compression fracture of first lumbar vertebra, subsequent encounter for fracture with delayed healing: Secondary | ICD-10-CM

## 2022-01-04 DIAGNOSIS — S3992XA Unspecified injury of lower back, initial encounter: Secondary | ICD-10-CM

## 2022-01-04 DIAGNOSIS — N181 Chronic kidney disease, stage 1: Secondary | ICD-10-CM

## 2022-01-04 LAB — BASIC METABOLIC PANEL
BUN: 15 mg/dL (ref 6–23)
CO2: 31 mEq/L (ref 19–32)
Calcium: 9.4 mg/dL (ref 8.4–10.5)
Chloride: 102 mEq/L (ref 96–112)
Creatinine, Ser: 1.21 mg/dL (ref 0.40–1.50)
GFR: 62.88 mL/min (ref >60.00)
Glucose, Bld: 81 mg/dL (ref 70–99)
Potassium: 4.6 mEq/L (ref 3.5–5.1)
Sodium: 138 mEq/L (ref 135–145)

## 2022-01-04 LAB — CBC WITH DIFFERENTIAL/PLATELET
Basophils Absolute: 0.1 10*3/uL (ref 0.0–0.1)
Basophils Relative: 0.7 % (ref 0.0–3.0)
Eosinophils Absolute: 0.1 10*3/uL (ref 0.0–0.7)
Eosinophils Relative: 1.6 % (ref 0.0–5.0)
HCT: 47.9 % (ref 39.0–52.0)
Hemoglobin: 16.2 g/dL (ref 13.0–17.0)
Lymphocytes Relative: 22.9 % (ref 12.0–46.0)
Lymphs Abs: 1.7 10*3/uL (ref 0.7–4.0)
MCHC: 33.8 g/dL (ref 30.0–36.0)
MCV: 93.2 fl (ref 78.0–100.0)
Monocytes Absolute: 0.5 10*3/uL (ref 0.1–1.0)
Monocytes Relative: 6 % (ref 3.0–12.0)
Neutro Abs: 5.2 10*3/uL (ref 1.4–7.7)
Neutrophils Relative %: 68.8 % (ref 43.0–77.0)
Platelets: 237 10*3/uL (ref 150.0–400.0)
RBC: 5.14 Mil/uL (ref 4.22–5.81)
RDW: 13.8 % (ref 11.5–15.5)
WBC: 7.6 10*3/uL (ref 4.0–10.5)

## 2022-01-04 LAB — URINALYSIS, ROUTINE W REFLEX MICROSCOPIC
Bilirubin Urine: NEGATIVE
Ketones, ur: NEGATIVE
Leukocytes,Ua: NEGATIVE
Nitrite: NEGATIVE
Specific Gravity, Urine: <=1.005 — AB (ref 1.000–1.030)
Total Protein, Urine: NEGATIVE
Urine Glucose: NEGATIVE
Urobilinogen, UA: 0.2 (ref 0.0–1.0)
pH: 6 (ref 5.0–8.0)

## 2022-01-04 LAB — LIPID PANEL
Cholesterol: 156 mg/dL (ref 0–200)
HDL: 73.7 mg/dL (ref >39.00)
LDL Cholesterol: 76 mg/dL (ref 0–99)
NonHDL: 82.61
Total CHOL/HDL Ratio: 2
Triglycerides: 35 mg/dL (ref 0.0–149.0)
VLDL: 7 mg/dL (ref 0.0–40.0)

## 2022-01-04 MED ORDER — OLMESARTAN MEDOXOMIL 20 MG PO TABS: 20.0000 mg | ORAL_TABLET | Freq: Every day | ORAL | 0 refills | Status: DC

## 2022-01-04 NOTE — Patient Instructions (Signed)
Hypertension, Adult High blood pressure (hypertension) is when the force of blood pumping through the arteries is too strong. The arteries are the blood vessels that carry blood from the heart throughout the body. Hypertension forces the heart to work harder to pump blood and may cause arteries to become narrow or stiff. Untreated or uncontrolled hypertension can lead to a heart attack, heart failure, a stroke, kidney disease, and other problems. A blood pressure reading consists of a higher number over a lower number. Ideally, your blood pressure should be below 120/80. The first ("top") number is called the systolic pressure. It is a measure of the pressure in your arteries as your heart beats. The second ("bottom") number is called the diastolic pressure. It is a measure of the pressure in your arteries as the heart relaxes. What are the causes? The exact cause of this condition is not known. There are some conditions that result in high blood pressure. What increases the risk? Certain factors may make you more likely to develop high blood pressure. Some of these risk factors are under your control, including: Smoking. Not getting enough exercise or physical activity. Being overweight. Having too much fat, sugar, calories, or salt (sodium) in your diet. Drinking too much alcohol. Other risk factors include: Having a personal history of heart disease, diabetes, high cholesterol, or kidney disease. Stress. Having a family history of high blood pressure and high cholesterol. Having obstructive sleep apnea. Age. The risk increases with age. What are the signs or symptoms? High blood pressure may not cause symptoms. Very high blood pressure (hypertensive crisis) may cause: Headache. Fast or irregular heartbeats (palpitations). Shortness of breath. Nosebleed. Nausea and vomiting. Vision changes. Severe chest pain, dizziness, and seizures. How is this diagnosed? This condition is diagnosed by  measuring your blood pressure while you are seated, with your arm resting on a flat surface, your legs uncrossed, and your feet flat on the floor. The cuff of the blood pressure monitor will be placed directly against the skin of your upper arm at the level of your heart. Blood pressure should be measured at least twice using the same arm. Certain conditions can cause a difference in blood pressure between your right and left arms. If you have a high blood pressure reading during one visit or you have normal blood pressure with other risk factors, you may be asked to: Return on a different day to have your blood pressure checked again. Monitor your blood pressure at home for 1 week or longer. If you are diagnosed with hypertension, you may have other blood or imaging tests to help your health care provider understand your overall risk for other conditions. How is this treated? This condition is treated by making healthy lifestyle changes, such as eating healthy foods, exercising more, and reducing your alcohol intake. You may be referred for counseling on a healthy diet and physical activity. Your health care provider may prescribe medicine if lifestyle changes are not enough to get your blood pressure under control and if: Your systolic blood pressure is above 130. Your diastolic blood pressure is above 80. Your personal target blood pressure may vary depending on your medical conditions, your age, and other factors. Follow these instructions at home: Eating and drinking  Eat a diet that is high in fiber and potassium, and low in sodium, added sugar, and fat. An example of this eating plan is called the DASH diet. DASH stands for Dietary Approaches to Stop Hypertension. To eat this way: Eat   plenty of fresh fruits and vegetables. Try to fill one half of your plate at each meal with fruits and vegetables. Eat whole grains, such as whole-wheat pasta, brown rice, or whole-grain bread. Fill about one  fourth of your plate with whole grains. Eat or drink low-fat dairy products, such as skim milk or low-fat yogurt. Avoid fatty cuts of meat, processed or cured meats, and poultry with skin. Fill about one fourth of your plate with lean proteins, such as fish, chicken without skin, beans, eggs, or tofu. Avoid pre-made and processed foods. These tend to be higher in sodium, added sugar, and fat. Reduce your daily sodium intake. Many people with hypertension should eat less than 1,500 mg of sodium a day. Do not drink alcohol if: Your health care provider tells you not to drink. You are pregnant, may be pregnant, or are planning to become pregnant. If you drink alcohol: Limit how much you have to: 0-1 drink a day for women. 0-2 drinks a day for men. Know how much alcohol is in your drink. In the [place] one drink equals one 12 oz bottle of beer (355 mL), one 5 oz glass of wine (148 mL), or one 1 oz glass of hard liquor (44 mL). Lifestyle  Work with your health care provider to maintain a healthy body weight or to lose weight. Ask what an ideal weight is for you. Get at least 30 minutes of exercise that causes your heart to beat faster (aerobic exercise) most days of the week. Activities may include walking, swimming, or biking. Include exercise to strengthen your muscles (resistance exercise), such as Pilates or lifting weights, as part of your weekly exercise routine. Try to do these types of exercises for 30 minutes at least 3 days a week. Do not use any products that contain nicotine or tobacco. These products include cigarettes, chewing tobacco, and vaping devices, such as e-cigarettes. If you need help quitting, ask your health care provider. Monitor your blood pressure at home as told by your health care provider. Keep all follow-up visits. This is important. Medicines Take over-the-counter and prescription medicines only as told by your health care provider. Follow directions carefully. Blood  pressure medicines must be taken as prescribed. Do not skip doses of blood pressure medicine. Doing this puts you at risk for problems and can make the medicine less effective. Ask your health care provider about side effects or reactions to medicines that you should watch for. Contact a health care provider if you: Think you are having a reaction to a medicine you are taking. Have headaches that keep coming back (recurring). Feel dizzy. Have swelling in your ankles. Have trouble with your vision. Get help right away if you: Develop a severe headache or confusion. Have unusual weakness or numbness. Feel faint. Have severe pain in your chest or abdomen. Vomit repeatedly. Have trouble breathing. These symptoms may be an emergency. Get help right away. Call 911. Do not wait to see if the symptoms will go away. Do not drive yourself to the hospital. Summary Hypertension is when the force of blood pumping through your arteries is too strong. If this condition is not controlled, it may put you at risk for serious complications. Your personal target blood pressure may vary depending on your medical conditions, your age, and other factors. For most people, a normal blood pressure is less than 120/80. Hypertension is treated with lifestyle changes, medicines, or a combination of both. Lifestyle changes include losing weight, eating a healthy,   low-sodium diet, exercising more, and limiting alcohol. This information is not intended to replace advice given to you by your health care provider. Make sure you discuss any questions you have with your health care provider. Document Revised: 02/08/2021 Document Reviewed: 02/08/2021 Elsevier Patient Education  2023 [employer]  

## 2022-01-04 NOTE — Progress Notes (Signed)
Subjective:  Patient ID: Stephen Sosa., male    DOB: 11-09-1955  Age: 66 y.o. MRN: [id number]  CC: Hypertension and Back Pain   HPI Stephen Sosa. presents for f/up -  He describes an accident that occurred about 3 weeks ago.  He jumped [place] a [place] and struck a rock injuring his right knee and his lower back.  Since then he has had right lower back pain that radiates towards his right hip.  The right knee and lower leg became swollen but over the ensuing weeks the swelling has resolved.  He has not had any pain in the right lower extremity.  He plays golf and does not experience chest pain, shortness of breath, diaphoresis, or edema.   Outpatient Medications Prior to Visit  Medication Sig Dispense Refill   budesonide-formoterol (SYMBICORT) 160-4.5 MCG/ACT inhaler Inhale 1 puff into the lungs 2 (two) times daily. 1 each 1   mometasone (ELOCON) 0.1 % cream APPLY A SMALL AMOUNT TO AFFECTED AREA AS DIRECTED 1-2 TIMES A DAY TO AFFECTED AREA,RASH ON LEGS, FLARES 45 g 1   pantoprazole (PROTONIX) 40 MG tablet Take 1 tablet (40 mg total) by mouth daily. 90 tablet 1   meloxicam (MOBIC) 15 MG tablet TAKE ONE TABLET BY MOUTH EVERY DAY 90 tablet 0   No facility-administered medications prior to visit.    ROS Review of Systems  Constitutional: Negative.  Negative for diaphoresis and fatigue.  HENT: Negative.    Eyes: Negative.   Respiratory: Negative.  Negative for cough, chest tightness, shortness of breath and wheezing.   Cardiovascular:  Negative for chest pain, palpitations and leg swelling.  Gastrointestinal:  Negative for abdominal pain, constipation, diarrhea, nausea and vomiting.  Endocrine: Negative.   Musculoskeletal:  Positive for back pain and joint swelling. Negative for arthralgias and myalgias.  Neurological: Negative.  Negative for dizziness, weakness, numbness and headaches.  Hematological:  Negative for adenopathy. Does not bruise/bleed easily.   Psychiatric/Behavioral: Negative.      Objective:  BP (!) 144/84 (BP Location: Left Arm, Patient Position: Sitting, Cuff Size: Normal)   Pulse 60   Temp 97.7 F (36.5 C) (Oral)   Resp 16   Ht '5\' 9"'$  (1.753 m)   Wt 204 lb (92.5 kg)   SpO2 99%   BMI 30.13 kg/m   BP Readings from Last 3 Encounters:  01/04/22 (!) 144/84  05/26/21 (!) 142/94  04/26/20 118/76    Wt Readings from Last 3 Encounters:  01/04/22 204 lb (92.5 kg)  05/26/21 208 lb (94.3 kg)  04/26/20 209 lb (94.8 kg)    Physical Exam Vitals reviewed.  Constitutional:      Appearance: Normal appearance.  HENT:     Nose: Nose normal.     Mouth/Throat:     Mouth: Mucous membranes are moist.  Eyes:     General: No scleral icterus.    Conjunctiva/sclera: Conjunctivae normal.  Cardiovascular:     Rate and Rhythm: Normal rate and regular rhythm.     Pulses: Normal pulses.     Heart sounds: No murmur heard.    No friction rub. No gallop.  Pulmonary:     Effort: Pulmonary effort is normal.     Breath sounds: No stridor. No wheezing, rhonchi or rales.  Abdominal:     General: Abdomen is flat.     Palpations: There is no mass.     Tenderness: There is no abdominal tenderness. There is no guarding.  Hernia: No hernia is present.  Musculoskeletal:     Cervical back: Neck supple.     Right knee: Swelling and deformity present. No effusion, erythema, ecchymosis, bony tenderness or crepitus. Decreased range of motion. No tenderness.     Left knee: Normal.     Right lower leg: No edema.     Left lower leg: No edema.     Comments: There is a collection of subcutaneous tissue adjacent to the right patella.  Lymphadenopathy:     Cervical: No cervical adenopathy.  Skin:    General: Skin is warm and dry.  Neurological:     General: No focal deficit present.     Mental Status: He is alert. Mental status is at baseline.  Psychiatric:        Mood and Affect: Mood normal.        Behavior: Behavior normal.     Lab  Results  Component Value Date   WBC 7.6 01/04/2022   HGB 16.2 01/04/2022   HCT 47.9 01/04/2022   PLT 237.0 01/04/2022   GLUCOSE 81 01/04/2022   CHOL 156 01/04/2022   TRIG 35.0 01/04/2022   HDL 73.70 01/04/2022   LDLCALC 76 01/04/2022   ALT 31 05/26/2021   AST 20 05/26/2021   NA 138 01/04/2022   K 4.6 01/04/2022   CL 102 01/04/2022   CREATININE 1.21 01/04/2022   BUN 15 01/04/2022   CO2 31 01/04/2022   TSH 1.13 05/26/2021   PSA 1.17 05/26/2021   INR 1.06 01/14/2015   HGBA1C 5.5 12/24/2014    Pulmonary Function Test [facility] Only  Result Date: 02/12/2019 Spirometry Data Is Acceptable and Reproducible 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 EXAM: RIGHT KNEE - COMPLETE 4+ VIEW COMPARISON:  None Available. FINDINGS: Lucency along the bone prosthesis interface of the medial tibia. Findings could be due to loosening or periprosthetic fracture. Correlation with any more recent knee films would be helpful. The femoral component appears well seated. No obvious complicating features. There is a moderate-sized joint effusion noted. IMPRESSION: 1. Lucency along the bone prosthesis interface of the medial tibia could be due to loosening or periprosthetic fracture. Correlation with any more recent knee films would be helpful. If none are available a CT scan may be helpful for further evaluation. 2. Moderate-sized joint effusion. Electronically Signed   By: Marijo Sanes M.D.   On: 01/04/2022 14:41   DG Lumbar Spine Complete  Result Date: 01/04/2022 CLINICAL DATA:  Low back pain since jumping off of a pond tubo 3-4 weeks ago. EXAM: LUMBAR SPINE - COMPLETE 4+ VIEW COMPARISON:  Lumbar spine MRI from 2017 FINDINGS: Transitional lumbar  anatomy.  Lumbarization of S1 is noted. 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 Slight compression deformities of L1 and L2 insert age but could not exclude acute process. MRI may be helpful for further evaluation if the patient has upper lumbar back pain. No other significant bony findings. No pars defects. The visualized bony pelvis is intact. IMPRESSION: 1. Slight compression deformities of L1 and L2 of uncertain age. MRI may be helpful for further evaluation if the patient has upper lumbar back pain. 2. 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  Electronically Signed   By: Marijo Sanes M.D.   On: 01/04/2022 14:37     Assessment & Plan:   Ocie was seen today for hypertension and back pain.  Diagnoses and all orders for this visit:  Flu vaccine need -     Flu Vaccine QUAD High Dose(Fluad)  Primary hypertension- He has not achieved his blood pressure goal of 130/80.  Will start an ARB. -     CBC with Differential/Platelet; Future -     Urinalysis, Routine w reflex microscopic; Future -     Urinalysis, Routine w reflex microscopic -     CBC with Differential/Platelet -     olmesartan (BENICAR) 20 MG tablet; Take 1 tablet (20 mg total) by mouth daily.  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  I have asked him to follow-up with his orthopedist. -     DG Knee Complete 4 Views Right; Future  Lower back injury, initial encounter- Plain films are c/w  L1-L2 lumbar  vertebral fractures.  I recommended an MRI to confirm fracture and to evaluate for complications. -     DG Lumbar Spine Complete; Future -     MR Lumbar Spine Wo Contrast; Future  Screen for colon cancer -     Cologuard  Encounter for general adult medical examination with abnormal findings -     Lipid panel; Future -     Lipid panel  Closed wedge compression fracture of L1 vertebra with delayed healing, subsequent encounter -     MR Lumbar Spine Wo Contrast; Future  Chronic renal disease, stage 2, mildly decreased glomerular filtration rate (GFR) between 60-89 mL/min/1.73 square meter- Will discontinue the NSAID and will start an ARB. -     olmesartan (BENICAR) 20 MG tablet; Take 1 tablet (20 mg total) by mouth daily.   I have discontinued Stephen Sosa. "J.W. Keay Jr"'s meloxicam. I am also having him start on olmesartan. Additionally, I am having him maintain his mometasone, pantoprazole, and budesonide-formoterol.  Meds ordered this encounter  Medications   olmesartan (BENICAR) 20 MG tablet    Sig: Take 1 tablet (20 mg total) by mouth daily.    Dispense:  90 tablet    Refill:  0   I spent 45 minutes in preparing to see the patient by review of recent labs and imaging, obtaining and reviewing separately obtained history, communicating with the patient, ordering medications and xrays, and documenting clinical information in the EHR including the differential Dx, treatment, and any further evaluation and management of multiple complex medical issues.     Follow-up: Return in about 3 months (around 04/05/2022).  Scarlette Calico, MD

## 2022-01-05 ENCOUNTER — Telehealth: Payer: Self-pay | Admitting: Internal Medicine

## 2022-01-05 NOTE — Telephone Encounter (Signed)
Patient would like a copy of his xrays from yesterday to take with him to his orthopedic surgeon and the neuro surgeon.  Please call patient and advise when he can come by and pick up.

## 2022-01-05 NOTE — Telephone Encounter (Signed)
Pt has been informed to pick up CD copy of imaging at the check in for x-ray on the first floor.

## 2022-02-03 ENCOUNTER — Other Ambulatory Visit: Payer: Self-pay | Admitting: Internal Medicine

## 2022-02-03 ENCOUNTER — Telehealth: Payer: Self-pay | Admitting: Internal Medicine

## 2022-02-03 NOTE — Telephone Encounter (Signed)
Patient is experiencing a little ligh headed - Patient thinks this is coming from his benicar - should  he continue taking this.  BP 106/63 - pulse rate 78.  Should he be concerned?  Please advise.

## 2022-02-07 NOTE — Telephone Encounter (Signed)
Pt stated that he had stopped taking the medication the day he called and his BP has been doing much better.

## 2022-02-16 ENCOUNTER — Ambulatory Visit: Payer: Managed Care, Other (non HMO) | Admitting: Dermatology

## 2022-02-24 ENCOUNTER — Ambulatory Visit: Payer: Managed Care, Other (non HMO) | Admitting: Dermatology

## 2022-05-15 ENCOUNTER — Ambulatory Visit: Payer: Managed Care, Other (non HMO) | Admitting: Dermatology

## 2022-05-18 ENCOUNTER — Telehealth: Payer: Self-pay | Admitting: Internal Medicine

## 2022-05-18 DIAGNOSIS — J4520 Mild intermittent asthma, uncomplicated: Secondary | ICD-10-CM

## 2022-05-18 MED ORDER — BUDESONIDE-FORMOTEROL FUMARATE 160-4.5 MCG/ACT IN AERO: 1.0000 | INHALATION_SPRAY | Freq: Two times a day (BID) | RESPIRATORY_TRACT | 0 refills | Status: AC

## 2022-05-18 NOTE — Telephone Encounter (Signed)
Pharmacy called requested for a refill for budesonide-formoterol (SYMBICORT) 160-4.5 MCG/ACT inhaler.   Preferred pharmacy:[facility]

## 2022-09-13 ENCOUNTER — Encounter: Payer: Self-pay | Admitting: Dermatology

## 2022-09-13 ENCOUNTER — Ambulatory Visit: Payer: Managed Care, Other (non HMO) | Admitting: Dermatology

## 2022-09-13 VITALS — BP 105/71 | HR 58

## 2022-09-13 DIAGNOSIS — S50861A Insect bite (nonvenomous) of right forearm, initial encounter: Secondary | ICD-10-CM

## 2022-09-13 DIAGNOSIS — L578 Other skin changes due to chronic exposure to nonionizing radiation: Secondary | ICD-10-CM

## 2022-09-13 DIAGNOSIS — Z1283 Encounter for screening for malignant neoplasm of skin: Secondary | ICD-10-CM

## 2022-09-13 DIAGNOSIS — L820 Inflamed seborrheic keratosis: Secondary | ICD-10-CM | POA: Diagnosis not present

## 2022-09-13 DIAGNOSIS — D229 Melanocytic nevi, unspecified: Secondary | ICD-10-CM

## 2022-09-13 DIAGNOSIS — I83813 Varicose veins of bilateral lower extremities with pain: Secondary | ICD-10-CM

## 2022-09-13 DIAGNOSIS — W908XXA Exposure to other nonionizing radiation, initial encounter: Secondary | ICD-10-CM

## 2022-09-13 DIAGNOSIS — L821 Other seborrheic keratosis: Secondary | ICD-10-CM

## 2022-09-13 DIAGNOSIS — Z86018 Personal history of other benign neoplasm: Secondary | ICD-10-CM

## 2022-09-13 DIAGNOSIS — W57XXXA Bitten or stung by nonvenomous insect and other nonvenomous arthropods, initial encounter: Secondary | ICD-10-CM

## 2022-09-13 DIAGNOSIS — Z79899 Other long term (current) drug therapy: Secondary | ICD-10-CM

## 2022-09-13 DIAGNOSIS — X32XXXA Exposure to sunlight, initial encounter: Secondary | ICD-10-CM

## 2022-09-13 DIAGNOSIS — D1801 Hemangioma of skin and subcutaneous tissue: Secondary | ICD-10-CM

## 2022-09-13 DIAGNOSIS — Z7189 Other specified counseling: Secondary | ICD-10-CM

## 2022-09-13 DIAGNOSIS — Z872 Personal history of diseases of the skin and subcutaneous tissue: Secondary | ICD-10-CM

## 2022-09-13 DIAGNOSIS — L814 Other melanin hyperpigmentation: Secondary | ICD-10-CM

## 2022-09-13 MED ORDER — DOXYCYCLINE MONOHYDRATE 100 MG PO CAPS: 100.0000 mg | ORAL_CAPSULE | Freq: Two times a day (BID) | ORAL | 0 refills | Status: AC

## 2022-09-13 NOTE — Patient Instructions (Addendum)
For tick bite  Start doxycycline 100 mg capsule take 1 pill by mouth twice daily for 1 month     Doxycycline should be taken with food to prevent nausea. Do not lay down for 30 minutes after taking. Be cautious with sun exposure and use good sun protection while on this medication. Pregnant women should not take this medication.      Seborrheic Keratosis  What causes seborrheic keratoses? Seborrheic keratoses are harmless, common skin growths that first appear during adult life.  As time goes by, more growths appear.  Some people may develop a large number of them.  Seborrheic keratoses appear on both covered and uncovered body parts.  They are not caused by sunlight.  The tendency to develop seborrheic keratoses can be inherited.  They vary in color from skin-colored to gray, brown, or even black.  They can be either smooth or have a rough, warty surface.   Seborrheic keratoses are superficial and look as if they were stuck on the skin.  Under the microscope this type of keratosis looks like layers upon layers of skin.  That is why at times the top layer may seem to fall off, but the rest of the growth remains and re-grows.    Treatment Seborrheic keratoses do not need to be treated, but can easily be removed in the office.  Seborrheic keratoses often cause symptoms when they rub on clothing or jewelry.  Lesions can be in the way of shaving.  If they become inflamed, they can cause itching, soreness, or burning.  Removal of a seborrheic keratosis can be accomplished by freezing, burning, or surgery. If any spot bleeds, scabs, or grows rapidly, please return to have it checked, as these can be an indication of a skin cancer.   Cryotherapy Aftercare  Wash gently with soap and water everyday.   Apply Vaseline and Band-Aid daily until healed.      Melanoma ABCDEs  Melanoma is the most dangerous type of skin cancer, and is the leading cause of death from skin disease.  You are more  likely to develop melanoma if you: Have light-colored skin, light-colored eyes, or red or blond hair Spend a lot of time in the sun Tan regularly, either outdoors or in a tanning bed Have had blistering sunburns, especially during childhood Have a close family member who has had a melanoma Have atypical moles or large birthmarks  Early detection of melanoma is key since treatment is typically straightforward and cure rates are extremely high if we catch it early.   The first sign of melanoma is often a change in a mole or a new dark spot.  The ABCDE system is a way of remembering the signs of melanoma.  A for asymmetry:  The two halves do not match. B for border:  The edges of the growth are irregular. C for color:  A mixture of colors are present instead of an even brown color. D for diameter:  Melanomas are usually (but not always) greater than 6mm - the size of a pencil eraser. E for evolution:  The spot keeps changing in size, shape, and color.  Please check your skin once per month between visits. You can use a small mirror in front and a large mirror behind you to keep an eye on the back side or your body.   If you see any new or changing lesions before your next follow-up, please call to schedule a visit.  Please continue daily skin protection  including broad spectrum sunscreen SPF 30+ to sun-exposed areas, reapplying every 2 hours as needed when you're outdoors.   Staying in the shade or wearing long sleeves, sun glasses (UVA+UVB protection) and wide brim hats (4-inch brim around the entire circumference of the hat) are also recommended for sun protection.    Due to recent changes in healthcare laws, you may see results of your pathology and/or laboratory studies on MyChart before the doctors have had a chance to review them. We understand that in some cases there may be results that are confusing or concerning to you. Please understand that not all results are received at the same  time and often the doctors may need to interpret multiple results in order to provide you with the best plan of care or course of treatment. Therefore, we ask that you please give [place] 2 business days to thoroughly review all your results before contacting the office for clarification. Should we see a critical lab result, you will be contacted sooner.   If You Need Anything After Your Visit  If you have any questions or concerns for your doctor, please call our main line at [phone] and press option 4 to reach your doctor's medical assistant. If no one answers, please leave a voicemail as directed and we will return your call as soon as possible. Messages left after 4 pm will be answered the following business day.   You may also send [place] a message via MyChart. We typically respond to MyChart messages within 1-2 business days.  For prescription refills, please ask your pharmacy to contact our office. Our fax number is [phone].  If you have an urgent issue when the clinic is closed that cannot wait until the next business day, you can page your doctor at the number below.    Please note that while we do our best to be available for urgent issues outside of office hours, we are not available 24/7.   If you have an urgent issue and are unable to reach [place], you may choose to seek medical care at your doctor's office, retail clinic, urgent care center, or emergency room.  If you have a medical emergency, please immediately call 911 or go to the emergency department.  Pager Numbers  - Dr. Gwen Pounds: [phone]  - Dr. Neale Burly: [phone]  - Dr. Roseanne Reno: [phone]  In the event of inclement weather, please call our main line at [phone] for an update on the status of any delays or closures.  Dermatology Medication Tips: Please keep the boxes that topical medications come in in order to help keep track of the instructions about where and how to use these. Pharmacies typically print  the medication instructions only on the boxes and not directly on the medication tubes.   If your medication is too expensive, please contact our office at [phone] option 4 or send [place] a message through MyChart.   We are unable to tell what your co-pay for medications will be in advance as this is different depending on your insurance coverage. However, we may be able to find a substitute medication at lower cost or fill out paperwork to get insurance to cover a needed medication.   If a prior authorization is required to get your medication covered by your insurance company, please allow [place] 1-2 business days to complete this process.  Drug prices often vary depending on where the prescription is filled and some pharmacies may offer cheaper prices.  The website www.goodrx.com contains coupons  for medications through different pharmacies. The prices here do not account for what the cost may be with help from insurance (it may be cheaper with your insurance), but the website can give you the price if you did not use any insurance.  - You can print the associated coupon and take it with your prescription to the pharmacy.  - You may also stop by our office during regular business hours and pick up a GoodRx coupon card.  - If you need your prescription sent electronically to a different pharmacy, notify our office through [facility] or by phone at [phone] option 4.     Si Usted Necesita Algo Despus de Su Visita  Tambin puede enviarnos un mensaje a travs de Clinical cytogeneticist. Por lo general respondemos a los mensajes de MyChart en el transcurso de 1 a 2 das hbiles.  Para renovar recetas, por favor pida a su farmacia que se ponga en contacto con nuestra oficina. Annie Sable de fax es [place] [phone].  Si tiene un asunto urgente cuando la clnica est cerrada y que no puede esperar hasta el siguiente da hbil, puede llamar/localizar a su doctor(a) al nmero que aparece a  continuacin.   Por favor, tenga en cuenta que aunque hacemos todo lo posible para estar disponibles para asuntos urgentes fuera del horario de [place], no estamos disponibles las 24 horas del da, los 7 [address]  [address] de la [place].   Si tiene un problema urgente y no puede comunicarse con nosotros, puede optar por buscar atencin mdica  en el consultorio de su doctor(a), en una clnica privada, en un centro de atencin urgente o en una sala de emergencias.  Si tiene Engineer, drilling, por favor llame inmediatamente al 911 o vaya a la sala de emergencias.  Nmeros de bper  - Dr. Gwen Pounds: [phone]  - Dra. Moye: [phone]  - Dra. Roseanne Reno: [phone]  En caso de inclemencias del [place], por favor llame a Lacy Duverney principal al [phone] para una actualizacin sobre el [place] de cualquier retraso o cierre.  Consejos para la medicacin en dermatologa: Por favor, guarde las cajas en las que vienen los medicamentos de uso tpico para ayudarle a seguir las instrucciones sobre dnde y cmo usarlos. Las farmacias generalmente imprimen las instrucciones del medicamento slo en las cajas y no directamente en los tubos del [place].   Si su medicamento es muy caro, por favor, pngase en contacto con Rolm Gala llamando al [phone] y presione la opcin 4 o envenos un mensaje a travs de Clinical cytogeneticist.   No podemos decirle cul ser su copago por los medicamentos por adelantado ya que esto es diferente dependiendo de la cobertura de su seguro. Sin embargo, es posible que podamos encontrar un medicamento sustituto a Audiological scientist un formulario para que el seguro cubra el medicamento que se considera necesario.   Si se requiere una autorizacin previa para que su compaa de seguros [place] su medicamento, por favor permtanos de 1 a 2 das hbiles para completar [address]  Los precios de los medicamentos varan con frecuencia dependiendo del Environmental consultant de dnde se surte la receta  y alguna farmacias pueden ofrecer precios ms baratos.  El sitio web www.goodrx.com tiene cupones para medicamentos de Health and safety inspector. Los precios aqu no tienen en cuenta lo que podra costar con la ayuda del seguro (puede ser ms barato con su seguro), pero el sitio web puede darle el precio si no utiliz Tourist information centre manager.  - Puede imprimir el cupn correspondiente  y llevarlo con su receta a la farmacia.  - Tambin puede pasar por nuestra oficina durante el horario de atencin regular y Education officer, museum una tarjeta de cupones de GoodRx.  - Si necesita que su receta se enve electrnicamente a una farmacia diferente, informe a nuestra oficina a travs de MyChart de [place] o por telfono llamando al [phone] y presione la opcin 4.

## 2022-09-13 NOTE — Progress Notes (Signed)
Follow-Up Visit   Subjective  Stephen Sosa. is a 67 y.o. male who presents for the following: Skin Cancer Screening and Full Body Skin Exam  hx of dysplastic, hx of aks. Reports some spots at back, face, left leg and right arm.  The patient presents for Total-Body Skin Exam (TBSE) for skin cancer screening and mole check. The patient has spots, moles and lesions to be evaluated, some may be new or changing and the patient has concerns that these could be cancer.  The following portions of the chart were reviewed this encounter and updated as appropriate: medications, allergies, medical history  Review of Systems:  No other skin or systemic complaints except as noted in HPI or Assessment and Plan.  Objective  Well appearing patient in no apparent distress; mood and affect are within normal limits.  A full examination was performed including scalp, head, eyes, ears, nose, lips, neck, chest, axillae, abdomen, back, buttocks, bilateral upper extremities, bilateral lower extremities, hands, feet, fingers, toes, fingernails, and toenails. All findings within normal limits unless otherwise noted below.   Relevant physical exam findings are noted in the Assessment and Plan.  left scalp x 1 Erythematous stuck-on, waxy papule or plaque  b/l lower legs Dilated blue, purple or red veins at the lower extremities    Assessment & Plan   Tick Bite at right arm Exam: tick imbedding in skin surrounding erythema (small deer tick) Treatment Plan: Start Doxycycline 100 mg po bid for 1 month to prevent Lyme disease Doxycycline should be taken with food to prevent nausea. Do not lay down for 30 minutes after taking. Be cautious with sun exposure and use good sun protection while on this medication. Pregnant women should not take this medication.   LENTIGINES, SEBORRHEIC KERATOSES, HEMANGIOMAS - Benign normal skin lesions - Benign-appearing - Call for any changes  MELANOCYTIC NEVI -  Tan-brown and/or pink-flesh-colored symmetric macules and papules - Benign appearing on exam today - Observation - Call clinic for new or changing moles - Recommend daily use of broad spectrum spf 30+ sunscreen to sun-exposed areas.   ACTINIC DAMAGE - Chronic condition, secondary to cumulative UV/sun exposure - diffuse scaly erythematous macules with underlying dyspigmentation - Recommend daily broad spectrum sunscreen SPF 30+ to sun-exposed areas, reapply every 2 hours as needed.  - Staying in the shade or wearing long sleeves, sun glasses (UVA+UVB protection) and wide brim hats (4-inch brim around the entire circumference of the hat) are also recommended for sun protection.  - Call for new or changing lesions.  HISTORY OF DYSPLASTIC NEVUS Multiple sites see history  No evidence of recurrence today Recommend regular full body skin exams Recommend daily broad spectrum sunscreen SPF 30+ to sun-exposed areas, reapply every 2 hours as needed.  Call if any new or changing lesions are noted between office visits  SKIN CANCER SCREENING PERFORMED TODAY.  Inflamed seborrheic keratosis left scalp x 1  Symptomatic, irritating, patient would like treated.  Destruction of lesion - left scalp x 1 Complexity: simple   Destruction method: cryotherapy   Informed consent: discussed and consent obtained   Timeout:  patient name, date of birth, surgical site, and procedure verified Lesion destroyed using liquid nitrogen: Yes   Region frozen until ice ball extended beyond lesion: Yes   Outcome: patient tolerated procedure well with no complications   Post-procedure details: wound care instructions given    Varicose veins of both lower extremities with pain b/l lower legs  Reassured  Smaller vessels can be treated by sclerotherapy (a procedure to inject a medicine into the veins to make them disappear) if desired, but the treatment is not covered by insurance. Larger vessels may be covered if  symptomatic and we would refer to vascular surgeon if treatment desired.  Patient is bothered by and reports swelling in left leg, Patient would like referral to vascular surgeon for further treatment.  Ambulatory referral to Vascular Surgery - b/l lower legs   Return for see in feb or march for isk follow up, 1 year tbse .  IAsher Muir, CMA, am acting as scribe for Armida Sans, MD.  Documentation: I have reviewed the above documentation for accuracy and completeness, and I agree with the above.  Armida Sans, MD

## 2022-09-20 ENCOUNTER — Encounter: Payer: Self-pay | Admitting: Dermatology

## 2022-10-17 ENCOUNTER — Encounter (INDEPENDENT_AMBULATORY_CARE_PROVIDER_SITE_OTHER): Payer: Self-pay

## 2022-10-17 ENCOUNTER — Encounter (INDEPENDENT_AMBULATORY_CARE_PROVIDER_SITE_OTHER): Payer: Self-pay | Admitting: Vascular Surgery

## 2022-12-07 MED ORDER — METOPROLOL SUCCINATE ER 100 MG PO TB24
100 | ORAL_TABLET | Freq: Every day | ORAL | 0 refills | Status: AC
Start: 2022-12-07 — End: ?

## 2022-12-07 MED ORDER — APIXABAN 5 MG PO TABS
5 | ORAL_TABLET | Freq: Two times a day (BID) | ORAL | 0 refills | Status: AC
Start: 2022-12-07 — End: ?

## 2022-12-07 MED ORDER — CLOPIDOGREL BISULFATE 75 MG PO TABS
75 | ORAL_TABLET | Freq: Every day | ORAL | 0 refills | Status: AC
Start: 2022-12-07 — End: ?

## 2022-12-07 MED ORDER — FUROSEMIDE 40 MG PO TABS
40 | ORAL_TABLET | Freq: Every day | ORAL | 0 refills | Status: AC | PRN
Start: 2022-12-07 — End: ?

## 2022-12-07 MED ORDER — POTASSIUM CHLORIDE CRYS ER 10 MEQ PO TBCR
10 | ORAL_TABLET | Freq: Two times a day (BID) | ORAL | 0 refills | Status: AC | PRN
Start: 2022-12-07 — End: ?

## 2022-12-07 MED ORDER — LOSARTAN POTASSIUM 25 MG PO TABS
25 | ORAL_TABLET | Freq: Every day | ORAL | 0 refills | Status: AC
Start: 2022-12-07 — End: ?

## 2022-12-07 NOTE — Telephone Encounter (Signed)
Requested Prescriptions     Pending Prescriptions Disp Refills    losartan (COZAAR) 25 MG tablet [Pharmacy Med Name: LOSARTAN POTASSIUM 25 MG TAB] 90 tablet 0     Sig: TAKE ONE TABLET BY MOUTH EVERY DAY    apixaban (ELIQUIS) 5 MG TABS tablet 180 tablet 0     Sig: Take 1 tablet by mouth 2 times daily    clopidogrel (PLAVIX) 75 MG tablet 90 tablet 0     Sig: Take 1 tablet by mouth daily    metoprolol succinate (TOPROL XL) 100 MG extended release tablet 90 tablet 0     Sig: Take 0.5 tablets by mouth daily    furosemide (LASIX) 40 MG tablet 60 tablet 0     Sig: Take 1 tablet by mouth daily as needed (As needed for swelling & abnorma quick weight gain)    potassium chloride (KLOR-CON M) 10 MEQ extended release tablet 60 tablet 0     Sig: Take 1 tablet by mouth 2 times daily as needed (when lasix is taken)      Appt sched 12/14/22

## 2022-12-07 NOTE — Telephone Encounter (Signed)
LVM for pt to call back and get an echo scheduled

## 2022-12-11 DIAGNOSIS — I10 Essential (primary) hypertension: Secondary | ICD-10-CM

## 2022-12-11 NOTE — Telephone Encounter (Signed)
LVM for pt to call back and get an echo scheduled

## 2022-12-12 ENCOUNTER — Inpatient Hospital Stay
Admit: 2022-12-12 | Discharge: 2022-12-12 | Disposition: A | Discharge status: Home / Self Care | Payer: MEDICARE | Attending: Emergency Medicine

## 2022-12-12 LAB — CBC WITH AUTO DIFFERENTIAL
Basophils %: 1 % (ref 0.0–2.0)
Basophils Absolute: 0 10*3/uL (ref 0.0–0.2)
Eosinophils %: 3 % (ref 0.5–7.8)
Eosinophils Absolute: 0.2 10*3/uL (ref 0.0–0.8)
Hematocrit: 45.3 % (ref 41.1–50.3)
Hemoglobin: 14.7 g/dL (ref 13.6–17.2)
Immature Granulocytes %: 0 % (ref 0.0–5.0)
Immature Granulocytes Absolute: 0 10*3/uL (ref 0.0–0.5)
Lymphocytes %: 21 % (ref 13–44)
Lymphocytes Absolute: 1.5 10*3/uL (ref 0.5–4.6)
MCH: 28.8 pg (ref 26.1–32.9)
MCHC: 32.5 g/dL (ref 31.4–35.0)
MCV: 88.8 FL (ref 82.0–102.0)
MPV: 10.8 FL (ref 9.4–12.3)
Monocytes %: 10 % (ref 4.0–12.0)
Monocytes Absolute: 0.7 10*3/uL (ref 0.1–1.3)
Neutrophils %: 65 % (ref 43–78)
Neutrophils Absolute: 4.7 10*3/uL (ref 1.7–8.2)
Platelets: 172 10*3/uL (ref 150–450)
RBC: 5.1 M/uL (ref 4.23–5.6)
RDW: 14 % (ref 11.9–14.6)
WBC: 7.2 10*3/uL (ref 4.3–11.1)
nRBC: 0 10*3/uL (ref 0.0–0.2)

## 2022-12-12 LAB — COMPREHENSIVE METABOLIC PANEL W/ REFLEX TO MG FOR LOW K
ALT: 16 U/L (ref 12–65)
AST: 18 U/L (ref 15–37)
Albumin/Globulin Ratio: 1.3 (ref 1.0–1.9)
Albumin: 3.7 g/dL (ref 3.2–4.6)
Alk Phosphatase: 102 U/L (ref 40–129)
Anion Gap: 13 mmol/L (ref 9–18)
BUN: 15 mg/dL (ref 8–23)
CO2: 25 mmol/L (ref 20–28)
Calcium: 8.8 mg/dL (ref 8.8–10.2)
Chloride: 102 mmol/L (ref 98–107)
Creatinine: 1 mg/dL (ref 0.80–1.30)
Est, Glom Filt Rate: 83 mL/min/{1.73_m2} (ref >60)
Globulin: 2.8 g/dL (ref 2.3–3.5)
Glucose: 94 mg/dL (ref 70–99)
Potassium: 4.1 mmol/L (ref 3.5–5.1)
Sodium: 140 mmol/L (ref 136–145)
Total Bilirubin: 0.5 mg/dL (ref 0.0–1.2)
Total Protein: 6.5 g/dL (ref 6.3–8.2)

## 2022-12-12 LAB — EKG 12-LEAD
Atrial Rate: 0 {beats}/min
Q-T Interval: 462 ms
QRS Duration: 177 ms
QTc Calculation (Bazett): 499 ms
R Axis: -78 degrees
T Axis: 97 degrees
Ventricular Rate: 70 {beats}/min

## 2022-12-12 LAB — TROPONIN
Troponin T: 14 ng/L (ref 0–22)
Troponin T: 15 ng/L (ref 0–22)

## 2022-12-12 MED ORDER — HYDRALAZINE HCL 20 MG/ML IJ SOLN
20 | INTRAMUSCULAR | Status: DC
Start: 2022-12-12 — End: 2022-12-12

## 2022-12-12 MED ORDER — HYDRALAZINE HCL 20 MG/ML IJ SOLN
20 | INTRAMUSCULAR | Status: AC
Start: 2022-12-12 — End: 2022-12-12

## 2022-12-12 MED ADMIN — hydrALAZINE (APRESOLINE) injection 5 mg: 5 mg | INTRAVENOUS | @ 06:00:00 | NDC 63323061441

## 2022-12-12 MED FILL — HYDRALAZINE HCL 20 MG/ML IJ SOLN: 20 MG/ML | INTRAMUSCULAR | Qty: 1

## 2022-12-12 NOTE — ED Notes (Signed)
Patient mobility status  with no difficulty. Provider aware     I have reviewed discharge instructions with the patient.  The patient verbalized understanding.    Patient left ED via Discharge Method: ambulatory to Home with  family .    Opportunity for questions and clarification provided.     Patient given 0 scripts.

## 2022-12-12 NOTE — Discharge Instructions (Signed)
 Schedule close follow-up with your cardiologist, primary care physician.  Return to ED if symptoms worsen or progress in any way.

## 2022-12-12 NOTE — ED Triage Notes (Signed)
Pt c/o HTN, mild headache with movement for a few days. Denies chest pain or sob. States he has pacemaker.

## 2022-12-13 ENCOUNTER — Telehealth: Payer: Self-pay

## 2022-12-13 NOTE — Transitions of Care (Post Inpatient/ED Visit) (Signed)
   12/13/2022  Name: Stephen Sosa. MRN: [id number] DOB: 05-Oct-1955  Today's TOC FU Call Status: Today's TOC FU Call Status:: Unsuccessful Call (1st Attempt) Unsuccessful Call (1st Attempt) Date: 12/13/22  Attempted to reach the patient regarding the most recent Inpatient/ED visit.  Follow Up Plan: Additional outreach attempts will be made to reach the patient to complete the Transitions of Care (Post Inpatient/ED visit) call.   Signature Karena Addison, LPN [facility] Nurse Health Advisor Direct Dial [phone]

## 2022-12-14 ENCOUNTER — Ambulatory Visit: Admit: 2022-12-14 | Discharge: 2022-12-14 | Payer: MEDICARE | Attending: Acute Care

## 2022-12-14 ENCOUNTER — Encounter: Admit: 2022-12-14 | Discharge: 2022-12-21 | Payer: MEDICARE

## 2022-12-14 VITALS — BP 138/78 | HR 72 | Ht 76.0 in | Wt 260.0 lb

## 2022-12-14 DIAGNOSIS — I420 Dilated cardiomyopathy: Secondary | ICD-10-CM

## 2022-12-14 DIAGNOSIS — I10 Essential (primary) hypertension: Secondary | ICD-10-CM

## 2022-12-14 MED ORDER — FUROSEMIDE 40 MG PO TABS
40 | ORAL_TABLET | Freq: Every day | ORAL | 5 refills | Status: DC | PRN
Start: 2022-12-14 — End: 2023-03-29

## 2022-12-14 MED ORDER — APIXABAN 5 MG PO TABS
5 MG | ORAL_TABLET | Freq: Two times a day (BID) | ORAL | 3 refills | Status: DC
Start: 2022-12-14 — End: 2023-08-27

## 2022-12-14 MED ORDER — LOSARTAN POTASSIUM 25 MG PO TABS
25 | ORAL_TABLET | Freq: Every day | ORAL | 3 refills | Status: DC
Start: 2022-12-14 — End: 2024-02-20

## 2022-12-14 MED ORDER — METOPROLOL SUCCINATE ER 100 MG PO TB24
100 | ORAL_TABLET | Freq: Every day | ORAL | 3 refills | Status: DC
Start: 2022-12-14 — End: 2023-02-12

## 2022-12-14 MED ORDER — POTASSIUM CHLORIDE CRYS ER 10 MEQ PO TBCR
10 | ORAL_TABLET | Freq: Two times a day (BID) | ORAL | 5 refills | Status: DC | PRN
Start: 2022-12-14 — End: 2024-02-19

## 2022-12-14 NOTE — Progress Notes (Signed)
 [facility]  [address], [address]  [place], [place] [address]  PHONE: [phone]  1955/05/21    SUBJECTIVE:   [name] [name] is a 67 y.o. male seen for a follow up visit regarding the following:     Chief Complaint   Patient presents with    Atrial Fibrillation    HFrEF     Annual Exam    Device Check       HPI:    [name] [name] is a very pleasant 67 y.o. 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  Pt reports feeling well from a cardiac standpoint.  Denies chest pain, shortness of breath, or dizziness.  Device interrogation shows normal function, no events.         Cardiac PMH: (Old records have been reviewed and summarized below)  09/2010 LHC  - stable diffuse CAD   06/04/2018 echo: EF 25-30%    Reviewed office note Dr. Carlon 12/15/2021      Past Medical History, Past Surgical History, Family history, Social History, and Medications were all reviewed with the patient today and updated as necessary.     Current Outpatient Medications   Medication Sig Dispense Refill    apixaban  (ELIQUIS ) 5 MG TABS tablet Take 1 tablet by mouth 2 times daily 180 tablet 3    furosemide  (LASIX ) 40 MG tablet Take 1 tablet by mouth daily as needed (As needed for swelling & abnorma quick weight gain) 60 tablet 5    losartan  (COZAAR ) 25 MG tablet Take 1 tablet by mouth daily 90 tablet 3    metoprolol  succinate (TOPROL  XL) 100 MG extended release tablet Take 0.5 tablets by mouth daily 90 tablet 3    potassium chloride  (KLOR-CON  M) 10 MEQ extended release tablet Take 1 tablet by mouth 2 times daily as needed (when lasix  is taken) 60 tablet 5    sildenafil (VIAGRA) 50 MG tablet Viagra 50 mg tablet   1 tab prior to sexual intercourse      nitroGLYCERIN (NITROSTAT) 0.4 MG SL tablet Place 1 tablet under the tongue as needed       No current facility-administered medications for this visit.     Allergies    Allergen Reactions    Acetaminophen Hives    Aspirin Hives    Dairycare [Lactase-Lactobacillus]     Gluten Meal Other (See Comments)     Abdominal pain and hip pain    Statins Myalgia     @PROBLOVERVIEW @  Past Medical History:   Diagnosis Date    Atrial fibrillation with RVR (HCC) 11/05/2017    CAD (coronary artery disease)     CVA (cerebral vascular accident) (HCC) 09/05/2017    Dyslipidemia     Hypertension     Unstable angina (HCC) 02/02/2010     Past Surgical History:   Procedure Laterality Date    LEFT HEART CATH,PERCUTANEOUS  02/02/2010    stent x1    PR UNLISTED PROCEDURE CARDIAC SURGERY  11/2009    stent x1 LAD      No family history on file.  Social History     Tobacco Use    Smoking status: Never     Passive exposure: Past    Smokeless tobacco: Never   Substance Use Topics    Alcohol use: No       ROS:  A comprehensive review of systems was performed with the pertinent positives and negatives as noted in the HPI. All other systems were reviewed and are negative    PHYSICAL EXAM:    BP 138/78   Pulse 72   Ht 1.93 m (6' 4)   Wt 117.9 kg (260 lb)   BMI 31.65 kg/m      Wt Readings from Last 5 Encounters:   12/14/22 117.9 kg (260 lb)   12/12/22 117.9 kg (260 lb)   12/15/21 119.3 kg (263 lb)   12/06/20 117 kg (258 lb)   11/15/20 115.7 kg (255 lb)       BP Readings from Last 5 Encounters:   12/14/22 138/78   12/12/22 (!) 153/91   12/15/21 (!) 148/88   12/06/20 (!) 160/98   11/16/20 (!) 154/90       General appearance: Alert, well appearing, and in no distress   Eyes: Sclerae anicteric, Pupils equal, EOMI  ENT: Hearing grossly normal bilaterally, normal dentition  CV: RRR, no M/R/G, no JVD, normal distal pulses, no lower extremity edema  Pulm: Clear to auscultation, no wheezes, no crackles, no accessory muscle use  GI: Soft, nontender, nondistended, normal bowel sounds  Neuro: Alert and oriented  Skin: Normal coloration and turgor.  Psych: Appropriate affect, providing full and comprehensive history  MSK:  normal muscle bulk and tone    Medical problems and test results were reviewed with the patient today.     @resultsrcnt @    CBC   Recent Labs     12/12/22  0035   WBC 7.2   HGB 14.7   HCT 45.3   MCV 88.8   PLT 172        CMP  Lab Results   Component Value Date    NA 140 12/12/2022    K 4.1 12/12/2022    CL 102 12/12/2022    CO2 25 12/12/2022    BUN 15 12/12/2022    CREATININE 1.00 12/12/2022    GLUCOSE 94 12/12/2022    CALCIUM 8.8 12/12/2022    BILITOT 0.5 12/12/2022    ALKPHOS 102 12/12/2022    AST 18 12/12/2022    ALT 16 12/12/2022    LABGLOM 83 12/12/2022    GFRAA >60 11/15/2020    AGRATIO 1.1 (L) 09/14/2017    GLOB 2.8 12/12/2022         INR  Lab Results   Component Value Date    INR 1.0 06/28/2018    INR 1.0 06/04/2018    INR 1.1 12/18/2017    PROTIME 13.2 06/28/2018    PROTIME 13.3 06/04/2018    PROTIME 14.6 (H) 12/18/2017        THYROID  No results found for: TSH, TSHFT4, TSHELE, TSH3GEN, TSHHS     LIPIDS  No results found for: CHOL, TRIG, HDL, VLDL, CHOLHDLRATIO     Device Interrogation 12/14/2022:    Normal Device Function  Alerts or events: None  Battery: BOL,  Sensing, impedance and thresholds reviewed and tested  Presenting Rhythm: was Afib BiVp  Underlying Rhythm: was afib paced  Heart Rate Histograms reviewed  Pacing and Detection Parameters were evaluated      [name] [name] was seen today for atrial fibrillation, hfref , annual exam and device check.    Diagnoses and all orders for this visit:    Chronic systolic congestive heart failure (HCC)    Primary hypertension    Coronary artery disease of native artery of native heart with stable angina  pectoris (HCC)    Status post biventricular pacemaker    Other orders  -     apixaban  (ELIQUIS ) 5 MG TABS tablet; Take 1 tablet by mouth 2 times daily  -     furosemide  (LASIX ) 40 MG tablet; Take 1 tablet by mouth daily as needed (As needed for swelling & abnorma quick weight gain)  -     losartan  (COZAAR ) 25 MG tablet; Take 1 tablet by mouth  daily  -     metoprolol  succinate (TOPROL  XL) 100 MG extended release tablet; Take 0.5 tablets by mouth daily  -     potassium chloride  (KLOR-CON  M) 10 MEQ extended release tablet; Take 1 tablet by mouth 2 times daily as needed (when lasix  is taken)        ASSESSMENT and PLAN  Permanent AF and history LAA thrombus   - Continue Eliquis .     BSC BIV ICD and AVN ablation  - device interrogation showed normal function  - continue device check      CAD  - continue plavix /Eliquis .       Stroke ppx   - continue Eliquis .     Chronic systolic Heart failure  - last echo in 2020, repeat today     HTN   - stable today, was in the ER with elevated BP    Patient has been instructed and agrees to call our office with any issues or other concerns related to their cardiac condition(s) and/or complaint(s).  Patient will follow up in 1 year    No follow-up provider specified.        Garret JONETTA Schimke, APRN - CNP    12/14/2022  11:08 AM

## 2022-12-18 NOTE — Transitions of Care (Post Inpatient/ED Visit) (Signed)
   12/18/2022  Name: Gemma Payor. MRN: [id number] DOB: Dec 11, 1955  Today's TOC FU Call Status: Today's TOC FU Call Status:: Unsuccessful Call (2nd Attempt) Unsuccessful Call (1st Attempt) Date: 12/13/22 Unsuccessful Call (2nd Attempt) Date: 12/18/22  Attempted to reach the patient regarding the most recent Inpatient/ED visit.  Follow Up Plan: Additional outreach attempts will be made to reach the patient to complete the Transitions of Care (Post Inpatient/ED visit) call.   Signature Karena Addison, LPN [facility] Nurse Health Advisor Direct Dial [phone]

## 2022-12-21 NOTE — Telephone Encounter (Signed)
error 

## 2022-12-25 NOTE — Transitions of Care (Post Inpatient/ED Visit) (Signed)
   12/25/2022  Name: Gemma Payor. MRN: [id number] DOB: [birth year]  Today's TOC FU Call Status: Today's TOC FU Call Status:: Unsuccessful Call (3rd Attempt) Unsuccessful Call (1st Attempt) Date: 12/13/22 Unsuccessful Call (2nd Attempt) Date: 12/18/22 Unsuccessful Call (3rd Attempt) Date: 12/25/22  Attempted to reach the patient regarding the most recent Inpatient/ED visit.  Follow Up Plan: No further outreach attempts will be made at this time. We have been unable to contact the patient.  Signature Karena Addison, LPN [facility] Nurse Health Advisor Direct Dial [phone]

## 2022-12-26 ENCOUNTER — Encounter

## 2022-12-26 ENCOUNTER — Ambulatory Visit: Admit: 2022-12-26

## 2022-12-26 VITALS — BP 136/84 | Ht 75.98 in | Wt 259.9 lb

## 2022-12-26 DIAGNOSIS — I5020 Unspecified systolic (congestive) heart failure: Secondary | ICD-10-CM

## 2022-12-26 MED ORDER — PERFLUTREN LIPID MICROSPHERE IV SUSP
Freq: Once | INTRAVENOUS | Status: AC
Start: 2022-12-26 — End: 2022-12-26

## 2022-12-26 MED ADMIN — perflutren lipid microspheres (DEFINITY) injection 1.5 mL: 1.5 mL | INTRAVENOUS | @ 19:00:00 | NDC 11994001116

## 2022-12-27 LAB — ECHO (TTE) COMPLETE (PRN CONTRAST/BUBBLE/STRAIN/3D)
Ao Root Index: 1.81 cm/m2
Aortic Root: 4.5 cm
Ascending Aorta Index: 1.77 cm/m2
Ascending Aorta: 4.4 cm
E/E' Septal: 20.4
EF BP: 29 % — AB (ref 55–100)
Est. RA Pressure: 3 mmHg
Fractional Shortening 2D: 34 % (ref 28–44)
IVSd: 1.3 cm — AB (ref 0.6–1.0)
LA Area 2C: 37.9 cm2
LA Area 4C: 24.7 cm2
LA Diameter: 5.6 cm
LA Major Axis: 5.1 cm
LA Minor Axis: 6.8 cm
LA Size Index: 2.26 cm/m2
LA Volume Index MOD A2C: 67 mL/m2 — AB (ref 16–34)
LA Volume Index MOD A4C: 38 mL/m2 — AB (ref 16–34)
LA Volume MOD A2C: 166 mL — AB (ref 18–58)
LA Volume MOD A4C: 94 mL — AB (ref 18–58)
LA/AO Root Ratio: 1.24
LV E' Septal Velocity: 5 cm/s
LV EDV A2C: 301 mL
LV EDV A4C: 227 mL
LV EDV Index A2C: 121 mL/m2
LV EDV Index A4C: 92 mL/m2
LV ESV A2C: 226 mL
LV ESV A4C: 151 mL
LV ESV Index A2C: 91 mL/m2
LV ESV Index A4C: 61 mL/m2
LV Ejection Fraction A2C: 25 %
LV Ejection Fraction A4C: 33 %
LV Mass 2D Index: 139.3 g/m2 — AB (ref 49–115)
LV Mass 2D: 345.5 g — AB (ref 88–224)
LV RWT Ratio: 0.26
LVIDd Index: 2.74 cm/m2
LVIDd: 6.8 cm — AB (ref 4.2–5.9)
LVIDs Index: 1.81 cm/m2
LVIDs: 4.5 cm
LVOT Area: 3.8 cm2
LVOT Diameter: 2.2 cm
LVOT Mean Gradient: 2 mmHg
LVOT SV: 68.4 mL
LVOT Stroke Volume Index: 27.6 mL/m2
LVOT VTI: 18 cm
LVPWd: 0.9 cm (ref 0.6–1.0)
MV A Velocity: 0.28 m/s
MV E Velocity: 1.02 m/s
MV E Wave Deceleration Time: 204 ms
MV E/A: 3.64
RV Basal Dimension: 4.2 cm
RV Mid Dimension: 2.8 cm
RVIDd: 3.1 cm
RVSP: 17 mmHg
TR Max Velocity: 1.85 m/s
TR Peak Gradient: 14 mmHg

## 2022-12-27 NOTE — Telephone Encounter (Signed)
 The patient a moderate to severe CV risk for a mild to moderate risk surgery. Hold the blood thinner for 2-3 days and restart when able.     JJS

## 2022-12-27 NOTE — Telephone Encounter (Signed)
 Cardiac Clearance        Physician or Practice Requesting:Dr Orlean [name] Person: [name] [name] Phone Number: [phone]  Fax Number: [phone]  Date of Surgery/Procedure: TBD  Type of Surgery or Procedure: Total hip  Type of Anesthesia: general  Type of Clearance Requested: Cardiac Clearance and Medication Hold  Medication to Hold:Eliquis    Days to Hold: 72 hours

## 2022-12-27 NOTE — Telephone Encounter (Signed)
 Hold Eliquis for 3 days.     If plavix needs to be held, ensure no recent PCI within 1 year and would start ASA if off plavix.

## 2023-02-07 LAB — HEMOGLOBIN A1C
Estimated Avg Glucose, External: 114 mg/dL
Hemoglobin A1C, External: 5.6 % (ref <5.7)

## 2023-02-12 ENCOUNTER — Ambulatory Visit: Admit: 2023-02-12 | Discharge: 2023-02-12 | Payer: MEDICARE | Attending: Cardiovascular Disease

## 2023-02-12 DIAGNOSIS — I4891 Unspecified atrial fibrillation: Secondary | ICD-10-CM

## 2023-02-12 NOTE — Progress Notes (Signed)
[facility]  [address], [address]  [place], [place] [address]  PHONE: [phone]        02/12/23      NAME:  [name] [name]  DOB: [birth year]  MRN: [id number]     Follow Up    ASSESSMENT and PLAN:  Tonya was seen today for irregular heart beat and device check.    Diagnoses and all orders for this visit:    Anterior myocardial infarction [facility])    Unstable angina (HCC)    Dilated cardiomyopathy (HCC)    Atrial fibrillation with RVR (HCC)    Thrombus of left atrial appendage    Status post biventricular pacemaker    Coronary artery disease of native artery of native heart with stable angina pectoris [facility])    Primary hypertension    67 year old male with persistent AF with finding of LAA thrombus and reduced EF. He is s/p CRT-D/AVJ ablation. For the most part he feels well.      -NICM - stable, Reviewed device interrogation, stable. HeartLogic score stable. Continue GDMT as tolerated      -Permanent AF and history LAA thrombus - Continue Eliquis.      -Stroke ppx - continue Eliquis.     -CAD - stable, continue plavix/Eliquis.       -HLD - improved.      -HTN - elevated today, stable at home 120s/70s, continue BB and ARB. Recent increase in losartan to 50 mg from 25 mg. Lasix PRN.       -Upcoming hip surgery - moderate to severe CV risk for a mild to moderate risk surgery. Hold Eliquis for 2-3 days and restart ASAP.      -Encouraged weight loss.     -GI issues - stable.      -EP Follow up in 1 year or PRN.     Patient has been instructed and agrees to call our office with any issues or other concerns related to their cardiac condition(s) and/or complaint(s).    No follow-up provider specified.    Thank you for allowing me to participate in the electrophysiologic care of Mr. [name] [name]. Please contact me if any questions or concerns were to arise.    [name]. Riata Ikeda MD, MS  Clinical Cardiac Electrophysiology  [facility]  02/12/23  10:52  AM    ===================================================================  Chief Complant:    Chief Complaint   Patient presents with    Results     echo    Other     Discuss CCM    HFrEF        History:  [name] [name] is a most pleasant 67 y.o. male with a past medical and cardiac history significant for anterior MI, CVA (May 2019, received TPA) , HTN, CAD, dyslipidemia, AFIB and HTN  who underwent TEE on 11/05/17. He was found to have a reduced EF of 25% with anterior apical akinesis. He was found to have an LA thrombus that was confirmed with definity. Patient was admitted and started on IV heparin with bridge to warfarin.      He comes in for followup. He is now s/p CRT-D/AVJ in 2020, continues to fell well. EF has improved to 30-35% from 15-20%. Upcoming L hip replacement. The patient otherwise denies chest pain, dyspnea, presyncope, syncope or lateralizing symptoms.     Father died from complications of COVID in 2020.       Cardiac PMH: (Old records have been reviewed and summarized below)  EKG:  (EKG has been independently visualized by me with interpretation below): Biventricular pacing.      ECHO: 12/2022  Left Ventricle Moderately reduced left ventricular systolic function with a visually estimated EF of 30 - 35%. Left ventricle is moderately dilated. Mildly increased wall thickness. Findings consistent with mild concentric hypertrophy. Severe hypokinesis of the following segments: mid anteroseptal, apical anterior, apical septal and apical lateral.   Left Atrium Left atrium is moderately dilated.   Right Ventricle Right ventricle size is normal. RV basal diameter is 4.2 cm. RV mid diameter is 2.8 cm. Normal systolic function.   Right Atrium Right atrium size is normal.   Aortic Valve Mild sclerosis of the aortic valve cusps. Mild regurgitation. No stenosis.   Mitral Valve Mild annular calcification. Mild regurgitation. No stenosis noted.   Tricuspid Valve Valve structure is normal. Mild regurgitation. No  stenosis noted. The estimated RVSP is 17 mmHg.   Pulmonic Valve Valve structure is normal. Trace regurgitation. No stenosis noted.   Aorta Mildly dilated aortic root. Ao root diameter is 4.5 cm. Mildly dilated ascending aorta. Ao ascending diameter is 4.4 cm.   Pericardium No pericardial effusion.         ECHO: 11/05/2017   -  Left ventricle: The ventricle was dilated. Systolic function was markedly  reduced. Ejection fraction was estimated in the range of 15 % to 20 %.     -  Right ventricle: Systolic function was reduced.    -  Left atrium: The atrium was dilated.  -  Left atrial appendage: There was continuous spontaneous echo contrast   ("smoke") in the appendage. There was thrombus noted in the LAA. Due to  thrombus cardioversion was not performed.    -  Atrial septum: Appeared to be a PFO by color doppler.     -  Right atrium: The atrium was dilated.    -  Mitral valve: There was mild regurgitation.    -  Tricuspid valve: There was mild regurgitation.    -  Aorta, systemic arteries: There was mild atheroma.      Previous Heart Catheterization: 09/2010   CORONARY ARTERIOGRAMS   1. Both the right and left coronaries were large and patulous   proximally.   2. The left main coronary was quite large, was huge, and then tapered to   the LAD, circumflex systems.   3. The LAD coronary had evidence of proximal stenting which emanated from   the left main. The stented segment appeared widely patent with a good   inflow and outflow and no significant disruption. No luminal narrowing   and TIMI-3 flow in the distal LAD. There were no significant mid LAD   stenoses.   4. The circumflex coronary artery provided a ramus intermedius and a   moderate mid vessel marginal. The ramus intermedius was tortuous but   appeared free of significant disease. There was a high diagonal as well   which was tortuous and paralleled the ramus. It was small in caliber and   extent, was diffusely irregular but no high-grade stenosis.   5. The  circumflex coronary artery contained minor irregularity proximally   and then had a 40-50% stenosis. It was ectatic before it gave rise to the   mid vessel marginal. The mid vessel marginal had 50% stenosis in its   origin. The circumflex after the marginal origin contained an 80%   stenosis but was very limited in the distal circulation. On review of the  left system anatomy. There was no significant change from that previously   in October 2011.   6. The right coronary artery was also patulous and irregular, large   caliber trunk, tortuous with a mid vessel 40% stenosis, with a small   button of ulceration. Then was diffusely irregular throughout its distal   half. There was evidence of previous stenting from the distal right into   the PDA. The stented segment was widely patent. There was good luminal   appearance inflow and outflow to a large PDA. There was a subsegmental   PDA which was patent and had minor irregularity throughout. This was much   smaller in caliber than the primary PDA.   7. 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       IMPRESSION   1. Stable pattern of diffuse coronary disease as described.   2. Mild reduction in left ventricular systolic function with an apical   wall motion abnormality.      Stress Test: n/a       DEVICE INTERROGATION: BSC, implanted 06/2018. Stable lead parameters, BIV paced 100%. No VT/VF therapies.     Past Medical History, Past Surgical History, Family history, Social History, and Medications were all reviewed with the patient today and updated as necessary.     Current Outpatient Medications   Medication Sig Dispense Refill    metoprolol succinate (TOPROL XL) 50 MG extended release tablet Take 1 tablet by mouth daily      apixaban (ELIQUIS) 5 MG TABS tablet Take 1 tablet by mouth 2 times daily 180 tablet 3    furosemide (LASIX) 40 MG tablet Take 1 tablet by  mouth daily as needed (As needed for swelling & abnorma quick weight gain) 60 tablet 5    losartan (COZAAR) 25 MG tablet Take 1 tablet by mouth daily 90 tablet 3    potassium chloride (KLOR-CON M) 10 MEQ extended release tablet Take 1 tablet by mouth 2 times daily as needed (when lasix is taken) 60 tablet 5    sildenafil (VIAGRA) 50 MG tablet Viagra 50 mg tablet   1 tab prior to sexual intercourse      nitroGLYCERIN (NITROSTAT) 0.4 MG SL tablet Place 1 tablet under the tongue as needed       No current facility-administered medications for this visit.     Allergies   Allergen Reactions    Acetaminophen Hives    Aspirin Hives    Dairycare [Lactase-Lactobacillus]     Gluten Meal Other (See Comments)     Abdominal pain and hip pain    Statins Myalgia       Past Medical History:   Diagnosis Date    Atrial fibrillation with RVR (HCC) 11/05/2017    CAD (coronary artery disease)     CVA (cerebral vascular accident) (HCC) 09/05/2017    Dyslipidemia     Hypertension     Unstable angina (HCC) 02/02/2010     Past Surgical History:   Procedure Laterality Date    LEFT HEART CATH,PERCUTANEOUS  02/02/2010    stent x1    PR UNLISTED PROCEDURE CARDIAC SURGERY  11/2009    stent x1 LAD      No family history on file.  Social History     Tobacco Use    Smoking status: Never     Passive exposure: Past    Smokeless  tobacco: Never   Substance Use Topics    Alcohol use: No       ROS:  A comprehensive review of systems was performed with the pertinent positives and negatives as noted in the HPI in addition to:  Review of Systems   Constitutional: Negative.    HENT: Negative.     Eyes: Negative.    Respiratory: Negative.     Cardiovascular: Negative.    Gastrointestinal: Negative.    Endocrine: Negative.    Genitourinary: Negative.    Musculoskeletal: Negative.    Skin: Negative.    Allergic/Immunologic: Negative.    Neurological: Negative.    Hematological: Negative.    Psychiatric/Behavioral: Negative.     All other systems reviewed and are  negative.        PHYSICAL EXAM:   BP (!) 148/88   Pulse 70   Ht 1.93 m (6\' 4" )   Wt 115.7 kg (255 lb)   BMI 31.04 kg/m      Wt Readings from Last 3 Encounters:   02/12/23 115.7 kg (255 lb)   12/26/22 117.9 kg (259 lb 14.8 oz)   12/14/22 117.9 kg (260 lb)     BP Readings from Last 3 Encounters:   02/12/23 (!) 148/88   12/26/22 136/84   12/14/22 138/78     Gen: Well appearing, well developed, no acute distress  Eyes: Pupils equal, round. Extraocular movements are intact  ENT: Oropharynx clear, no oral lesions, normal dentition  CV: S1S2, regular rate and rhythm, no murmurs, rubs or gallops, normal JVD, no carotid bruits, normal distal pulses, no LEE, left sided CIED C/D/I.   Pulm: Clear to auscultation bilaterally, no accessory muscle uses, no wheezes or rales  GI: Soft, NT, ND, +BS  Neuro: Alert and oriented, nonfocal  Psych: Appropriate affect  Skin: Normal color and skin turgor  MSK: Normal muscle bulk and tone    Medical problems and test results were reviewed with the patient today.     No results found for any visits on 02/12/23.

## 2023-03-28 NOTE — Telephone Encounter (Signed)
 Hi Dr Devona Konig,  Recently, I have been experiencing a temporary & sudden spike in my heart rate.  It only happens in the evening.  If I get up and start moving around fast and then sit down, I feel chest pain, and then it stops after I rest.  It makes me w

## 2023-03-29 MED ORDER — FUROSEMIDE 40 MG PO TABS
40 | ORAL_TABLET | Freq: Every day | ORAL | 2 refills | Status: DC | PRN
Start: 2023-03-29 — End: 2024-02-19

## 2023-03-29 NOTE — Telephone Encounter (Addendum)
Remote received showing BIV pacing 98% w/ stable rates. No episodes. Stable device function.     Patient notified. RN instructed to monitor weight and take lasix PRN as directed for increasing HF symptoms.

## 2023-03-29 NOTE — Addendum Note (Signed)
Addended by: [name] on: 03/29/2023 11:01 AM     Modules accepted: Orders

## 2023-05-21 NOTE — Telephone Encounter (Signed)
The patient has a moderate CV risk for a low to moderate risk surgery. Hold the blood thinner for 2-3 days and restart when able.     JJS

## 2023-05-21 NOTE — Telephone Encounter (Signed)
Cardiac Clearance        Physician or Practice Requesting:[facility]   Contact Person:   Contact Phone Number: [phone]  Fax Number: [phone]  Date of Surgery/Procedure: 06/15/23  Type of Surgery or Procedure: right total hip replacement   Type of Anesthesia:   Type of Clearance Requested: risk assessment and any medication hold   Medication to Hold:Eliquis   Days to Hold: 72 hour hold

## 2023-05-24 ENCOUNTER — Ambulatory Visit: Payer: Managed Care, Other (non HMO) | Admitting: Dermatology

## 2023-06-28 ENCOUNTER — Ambulatory Visit: Payer: Managed Care, Other (non HMO) | Admitting: Dermatology

## 2023-08-27 MED ORDER — ELIQUIS 5 MG PO TABS
5 | ORAL_TABLET | Freq: Two times a day (BID) | ORAL | 1 refills | 30.00000 days | Status: DC
Start: 2023-08-27 — End: 2024-02-19

## 2023-08-27 NOTE — Telephone Encounter (Signed)
 Requested Prescriptions     Pending Prescriptions Disp Refills    ELIQUIS  5 MG TABS tablet [Pharmacy Med Name: ELIQUIS  5 MG TABLET] 180 tablet 0     Sig: TAKE ONE TABLET BY MOUTH TWICE A DAY    Verified rx in last OV date 02/12/23. Pharmacy confirmed. Erx as requested

## 2023-09-04 ENCOUNTER — Encounter (INDEPENDENT_AMBULATORY_CARE_PROVIDER_SITE_OTHER): Payer: Self-pay

## 2023-09-04 ENCOUNTER — Ambulatory Visit: Payer: Managed Care, Other (non HMO) | Admitting: Dermatology

## 2023-11-22 MED ORDER — METOPROLOL SUCCINATE ER 100 MG PO TB24
100 | ORAL_TABLET | ORAL | 0 refills | 90.00000 days | Status: DC
Start: 2023-11-22 — End: 2024-02-19

## 2023-11-22 NOTE — Telephone Encounter (Signed)
 Requested Prescriptions     Pending Prescriptions Disp Refills    metoprolol  succinate (TOPROL  XL) 100 MG extended release tablet [Pharmacy Med Name: METOPROLOL  SUCC ER 100 MG TAB] 90 tablet 0     Sig: TAKE ONE-HALF TABLETS BY MOUTH ONCE DAILY    Verified rx in last OV date 02/12/23. Pharmacy confirmed. Erx as requested

## 2023-12-18 ENCOUNTER — Telehealth: Payer: Self-pay

## 2023-12-18 NOTE — Telephone Encounter (Signed)
 Left spouse message to return my call: This was Sep 13 2023 date of service.   They did receive two separate charges, one for the office visit & one for the destruction so the charges are correct from the Guarantor point of view.   If he has a high deductible plan, typically it would have been the reason why he is responsible for most of the charges.   Stephen MoltBETHA Stephen will you call Stephen Sosa and explain all of this to her please?  [phone]       Previous Messages    ----- Message ----- From: Hester Alm BROCKS, MD Sent: 12/13/2023   7:18 PM EDT To: Stephen Sosa; Stephen Sosa, CMA Subject: RE: Billing Question                          The date of service was MAY 2024 (NOT 01/2023).  All that we did would have been covered by insurance.  There was a visit for skin cancer screening and treatment for Tick bite (00785) and a charge for freezing a keratosis of scalp (82889) Sounds like a scam unless these are correct charges and were not changed by [employer] staff. Or these charges just went to deductible. ----- Message ----- From: Sosa Stephen KANDICE Sent: 12/12/2023   3:24 PM EDT To: Alm BROCKS Hester, MD; Stephen Sosa, CMA Subject: Billing Question                              Spoke to wife Stephen Sosa just now as they have received a collections notice from [employer] regarding Stephen Sosas date of service 02/11/2023.  His appt was for a skin check and Dr. MARLA removed a tick from his arm.  Their portion of this visit was $213.62 but when they received the notice from [facility], the have 2 bills for the same DOS.  One for the above amount and one for $305.01. Collections agency wants [phone].  Dr. MARLA can you review the charges for this DOS and see if they were filed correctly  Palma do you mind reviewing to see what you can find out.  If all is correct, I can give her contact to Billing Department to future review.  Thank you Stephen

## 2024-01-15 NOTE — Telephone Encounter (Signed)
 Olam talked to patient's spouse:  - She said she was very very bad about checking VM but I gave her the above information and she said okay and thank you.

## 2024-02-15 ENCOUNTER — Encounter: Payer: Medicare (Managed Care) | Attending: Cardiovascular Disease

## 2024-02-18 ENCOUNTER — Encounter: Payer: Medicare (Managed Care) | Attending: Cardiovascular Disease

## 2024-02-19 ENCOUNTER — Ambulatory Visit: Admit: 2024-02-19 | Discharge: 2024-02-19 | Payer: Medicare (Managed Care) | Attending: Cardiovascular Disease

## 2024-02-19 ENCOUNTER — Ambulatory Visit: Admit: 2024-02-19 | Discharge: 2024-02-19 | Payer: Medicare (Managed Care)

## 2024-02-19 VITALS — BP 122/88 | HR 70 | Ht 76.0 in | Wt 263.0 lb

## 2024-02-19 DIAGNOSIS — I420 Dilated cardiomyopathy: Principal | ICD-10-CM

## 2024-02-19 MED ORDER — METOPROLOL SUCCINATE ER 50 MG PO TB24
50 | ORAL_TABLET | Freq: Two times a day (BID) | ORAL | 3 refills | Status: AC
Start: 2024-02-19 — End: ?

## 2024-02-19 MED ORDER — POTASSIUM CHLORIDE CRYS ER 10 MEQ PO TBCR
10 | ORAL_TABLET | Freq: Two times a day (BID) | ORAL | 5 refills | Status: AC | PRN
Start: 2024-02-19 — End: ?

## 2024-02-19 MED ORDER — APIXABAN 5 MG PO TABS
5 | ORAL_TABLET | Freq: Two times a day (BID) | ORAL | 3 refills | Status: AC
Start: 2024-02-19 — End: ?

## 2024-02-19 MED ORDER — FUROSEMIDE 40 MG PO TABS
40 | ORAL_TABLET | Freq: Every day | ORAL | 5 refills | Status: AC | PRN
Start: 2024-02-19 — End: ?

## 2024-02-19 NOTE — Progress Notes (Signed)
 "      [facility]  [address], [address]  [place], [place] [address]  PHONE: [phone]        02/19/24      NAME:  [name] [name]  DOB: [birth year]  MRN: [id number]     Follow Up    ASSESSMENT and PLAN:  [name] [name] was seen today for irregular heart beat and device check.    Diagnoses and all orders for this visit:    Anterior myocardial infarction [facility])    Unstable angina (HCC)    Dilated cardiomyopathy (HCC)    Atrial fibrillation with RVR (HCC)    Thrombus of left atrial appendage    Status post biventricular pacemaker    Coronary artery disease of native artery of native heart with stable angina pectoris [facility])    Primary hypertension    68 year old male with persistent AF with finding of LAA thrombus and reduced EF. He is s/p CRT-D/AVJ ablation. For the most part he feels well.      -NICM - stable, Reviewed device interrogation, stable. HeartLogic score stable. Continue GDMT as tolerated      -Permanent AF and history LAA thrombus - Continue Eliquis .      -Stroke ppx - continue Eliquis .     -CAD - stable, previous PCI x 2. continue Eliquis  as SAPT.      -HLD - improved. Following with PCP. He reports he reduced TC and LDL with diet/exercise and supplements. Goal LDL is <55 with history of obstructive CAD.      -HTN - Stable, continue BB and ARB. Recent increase in losartan  to 50 mg from 25 mg. Lasix  PRN.       -S/p R and L hip THA - doing well, healing well.      -Obesity - Encouraged weight loss.     -GI issues - stable.      -EP Follow up in 1 year or PRN.     Patient has been instructed and agrees to call our office with any issues or other concerns related to their cardiac condition(s) and/or complaint(s).    No follow-up provider specified.    Thank you for allowing me to participate in the electrophysiologic care of [name] [name]. Please contact me if any questions or concerns were to arise.    [name] [name]. [name] Sweda MD, MS  Clinical Cardiac Electrophysiology  [facility]  02/19/24  9:27  AM    ===================================================================  Chief Complant:    Chief Complaint   Patient presents with    Atrial Fibrillation    Device Check    Annual Exam      History:  [name] [name] is a most pleasant 68 y.o. male with a past medical and cardiac history significant for anterior MI, CVA (May 2019, received TPA) , HTN, CAD, dyslipidemia, AFIB and HTN  who underwent TEE on 11/05/17. He was found to have a reduced EF of 25% with anterior apical akinesis. He was found to have an LA thrombus that was confirmed with definity . Patient was admitted and started on IV heparin with bridge to warfarin.      He comes in for followup. He is now s/p CRT-D/AVJ in 2020, continues to fell well. EF has improved to 30-35% from 15-20%. Upcoming L hip replacement. The patient otherwise denies chest pain, dyspnea, presyncope, syncope or lateralizing symptoms.     Father died from complications of COVID in 2020.       Cardiac PMH: (  Old records have been reviewed and summarized below)      EKG:  (EKG has been independently visualized by me with interpretation below): Biventricular pacing.      ECHO: 12/2022  Left Ventricle Moderately reduced left ventricular systolic function with a visually estimated EF of 30 - 35%. Left ventricle is moderately dilated. Mildly increased wall thickness. Findings consistent with mild concentric hypertrophy. Severe hypokinesis of the following segments: mid anteroseptal, apical anterior, apical septal and apical lateral.   Left Atrium Left atrium is moderately dilated.   Right Ventricle Right ventricle size is normal. RV basal diameter is 4.2 cm. RV mid diameter is 2.8 cm. Normal systolic function.   Right Atrium Right atrium size is normal.   Aortic Valve Mild sclerosis of the aortic valve cusps. Mild regurgitation. No stenosis.   Mitral Valve Mild annular calcification. Mild regurgitation. No stenosis noted.   Tricuspid Valve Valve structure is normal. Mild regurgitation. No  stenosis noted. The estimated RVSP is 17 mmHg.   Pulmonic Valve Valve structure is normal. Trace regurgitation. No stenosis noted.   Aorta Mildly dilated aortic root. Ao root diameter is 4.5 cm. Mildly dilated ascending aorta. Ao ascending diameter is 4.4 cm.   Pericardium No pericardial effusion.      ECHO: 11/05/2017   -  Left ventricle: The ventricle was dilated. Systolic function was markedly  reduced. Ejection fraction was estimated in the range of 15 % to 20 %.     -  Right ventricle: Systolic function was reduced.    -  Left atrium: The atrium was dilated.  -  Left atrial appendage: There was continuous spontaneous echo contrast   (smoke) in the appendage. There was thrombus noted in the LAA. Due to  thrombus cardioversion was not performed.    -  Atrial septum: Appeared to be a PFO by color doppler.     -  Right atrium: The atrium was dilated.    -  Mitral valve: There was mild regurgitation.    -  Tricuspid valve: There was mild regurgitation.    -  Aorta, systemic arteries: There was mild atheroma.      Previous Heart Catheterization: 09/2010   CORONARY ARTERIOGRAMS   1. Both the right and left coronaries were large and patulous   proximally.   2. The left main coronary was quite large, was huge, and then tapered to   the LAD, circumflex systems.   3. The LAD coronary had evidence of proximal stenting which emanated from   the left main. The stented segment appeared widely patent with a good   inflow and outflow and no significant disruption. No luminal narrowing   and TIMI-3 flow in the distal LAD. There were no significant mid LAD   stenoses.   4. The circumflex coronary artery provided a ramus intermedius and a   moderate mid vessel marginal. The ramus intermedius was tortuous but   appeared free of significant disease. There was a high diagonal as well   which was tortuous and paralleled the ramus. It was small in caliber and   extent, was diffusely irregular but no high-grade stenosis.   5. The  circumflex coronary artery contained minor irregularity proximally   and then had a 40-50% stenosis. It was ectatic before it gave rise to the   mid vessel marginal. The mid vessel marginal had 50% stenosis in its   origin. The circumflex after the marginal origin contained an 80%   stenosis but was very  limited in the distal circulation. On review of the   left system anatomy. There was no significant change from that previously   in October 2011.   6. The right coronary artery was also patulous and irregular, large   caliber trunk, tortuous with a mid vessel 40% stenosis, with a small   button of ulceration. Then was diffusely irregular throughout its distal   half. There was evidence of previous stenting from the distal right into   the PDA. The stented segment was widely patent. There was good luminal   appearance inflow and outflow to a large PDA. There was a subsegmental   PDA which was patent and had minor irregularity throughout. This was much   smaller in caliber than the primary PDA.   7. 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       IMPRESSION   1. Stable pattern of diffuse coronary disease as described.   2. Mild reduction in left ventricular systolic function with an apical   wall motion abnormality.      Stress Test: n/a       DEVICE INTERROGATION: BSC, implanted 06/2018. Stable lead parameters, battery life stable. No VT/VF therapies.       Past Medical History, Past Surgical History, Family history, Social History, and Medications were all reviewed with the patient today and updated as necessary.     Current Outpatient Medications   Medication Sig Dispense Refill    metoprolol  succinate (TOPROL  XL) 100 MG extended release tablet TAKE ONE-HALF TABLETS BY MOUTH ONCE DAILY (Patient taking differently: Take 0.5 tablets by mouth daily) 90 tablet 0    ELIQUIS  5 MG TABS tablet TAKE ONE TABLET BY  MOUTH TWICE A DAY 180 tablet 1    furosemide  (LASIX ) 40 MG tablet Take 1 tablet by mouth daily as needed (As needed for swelling & abnorma quick weight gain) 60 tablet 2    losartan  (COZAAR ) 25 MG tablet Take 1 tablet by mouth daily 90 tablet 3    potassium chloride  (KLOR-CON  M) 10 MEQ extended release tablet Take 1 tablet by mouth 2 times daily as needed (when lasix  is taken) 60 tablet 5    metoprolol  succinate (TOPROL  XL) 50 MG extended release tablet Take 1 tablet by mouth daily      sildenafil (VIAGRA) 50 MG tablet Viagra 50 mg tablet   1 tab prior to sexual intercourse      nitroGLYCERIN (NITROSTAT) 0.4 MG SL tablet Place 1 tablet under the tongue as needed       No current facility-administered medications for this visit.     Allergies   Allergen Reactions    Acetaminophen Hives    Aspirin Hives    Dairycare [Bacid]     Gluten Meal Other (See Comments)     Abdominal pain and hip pain    Statins Myalgia       Past Medical History:   Diagnosis Date    Atrial fibrillation with RVR (HCC) 11/05/2017    CAD (coronary artery disease)     CVA (cerebral vascular accident) (HCC) 09/05/2017    Dyslipidemia     Hypertension     Unstable angina (HCC) 02/02/2010     Past Surgical History:   Procedure Laterality Date    LEFT HEART CATH,PERCUTANEOUS  02/02/2010    stent x1    PR UNLISTED PROCEDURE CARDIAC SURGERY  11/2009    stent x1 LAD      No family history on file.  Social History     Tobacco Use    Smoking status: Never     Passive exposure: Past    Smokeless tobacco: Never   Substance Use Topics    Alcohol use: No       ROS:  A comprehensive review of systems was performed with the pertinent positives and negatives as noted in the HPI in addition to:  Review of Systems   Constitutional: Negative.    HENT: Negative.     Eyes: Negative.    Respiratory: Negative.     Cardiovascular: Negative.    Gastrointestinal: Negative.    Endocrine: Negative.    Genitourinary: Negative.    Musculoskeletal: Negative.    Skin: Negative.     Allergic/Immunologic: Negative.    Neurological: Negative.    Hematological: Negative.    Psychiatric/Behavioral: Negative.     All other systems reviewed and are negative.    PHYSICAL EXAM:   Ht 1.93 m (6' 4)   Wt 119.3 kg (263 lb)   BMI 32.01 kg/m      Wt Readings from Last 3 Encounters:   02/19/24 119.3 kg (263 lb)   02/12/23 115.7 kg (255 lb)   12/26/22 117.9 kg (259 lb 14.8 oz)     BP Readings from Last 3 Encounters:   02/12/23 (!) 148/88   12/26/22 136/84   12/14/22 138/78     Gen: Well appearing, well developed, no acute distress  Eyes: Pupils equal, round. Extraocular movements are intact  ENT: Oropharynx clear, no oral lesions, normal dentition  CV: S1S2, regular rate and rhythm, no murmurs, rubs or gallops, normal JVD, no carotid bruits, normal distal pulses, no LEE, left sided CIED C/D/I.   Pulm: Clear to auscultation bilaterally, no accessory muscle uses, no wheezes or rales  GI: Soft, NT, ND, +BS  Neuro: Alert and oriented, nonfocal  Psych: Appropriate affect  Skin: Normal color and skin turgor  MSK: Normal muscle bulk and tone    Medical problems and test results were reviewed with the patient today.     No results found for any visits on 02/19/24.    "

## 2024-02-20 MED ORDER — LOSARTAN POTASSIUM 25 MG PO TABS
25 | ORAL_TABLET | Freq: Every day | ORAL | 3 refills | Status: AC
Start: 2024-02-20 — End: ?

## 2024-02-20 NOTE — Telephone Encounter (Signed)
"  Requested Prescriptions     Pending Prescriptions Disp Refills    losartan  (COZAAR ) 25 MG tablet [Pharmacy Med Name: LOSARTAN  POTASSIUM 25 MG TAB] 90 tablet 3     Sig: TAKE ONE TABLET BY MOUTH ONCE DAILY    Verified rx in last OV date 02/19/24. Pharmacy confirmed. Erx as requested   "
